# Patient Record
Sex: Female | Born: 1945 | Race: White | Hispanic: No | Smoking: Never smoker
Health system: Southern US, Community
[De-identification: ages and names within clinical notes are randomized; demographics above are authoritative.]

## PROBLEM LIST (undated history)

## (undated) DIAGNOSIS — I10 Essential (primary) hypertension: Secondary | ICD-10-CM

## (undated) DIAGNOSIS — I639 Cerebral infarction, unspecified: Secondary | ICD-10-CM

## (undated) HISTORY — PX: TUBAL LIGATION: SHX77

---

## 2018-03-31 ENCOUNTER — Other Ambulatory Visit: Payer: Self-pay

## 2018-03-31 ENCOUNTER — Emergency Department
Admission: EM | Admit: 2018-03-31 | Discharge: 2018-03-31 | Disposition: A | Discharge status: Home / Self Care | Payer: Medicare Other | Attending: Emergency Medicine | Admitting: Emergency Medicine

## 2018-03-31 ENCOUNTER — Emergency Department: Payer: Medicare Other

## 2018-03-31 DIAGNOSIS — I10 Essential (primary) hypertension: Secondary | ICD-10-CM | POA: Diagnosis not present

## 2018-03-31 DIAGNOSIS — Z8673 Personal history of transient ischemic attack (TIA), and cerebral infarction without residual deficits: Secondary | ICD-10-CM | POA: Insufficient documentation

## 2018-03-31 DIAGNOSIS — R112 Nausea with vomiting, unspecified: Secondary | ICD-10-CM | POA: Insufficient documentation

## 2018-03-31 DIAGNOSIS — R509 Fever, unspecified: Secondary | ICD-10-CM | POA: Diagnosis not present

## 2018-03-31 HISTORY — DX: Cerebral infarction, unspecified: I63.9

## 2018-03-31 HISTORY — DX: Essential (primary) hypertension: I10

## 2018-03-31 LAB — URINALYSIS, COMPLETE (UACMP) WITH MICROSCOPIC
Bacteria, UA: NONE SEEN
Bilirubin Urine: NEGATIVE
Glucose, UA: NEGATIVE mg/dL
Hgb urine dipstick: NEGATIVE
Ketones, ur: NEGATIVE mg/dL
Leukocytes, UA: NEGATIVE
Nitrite: NEGATIVE
Protein, ur: NEGATIVE mg/dL
SPECIFIC GRAVITY, URINE: 1.028 (ref 1.005–1.030)
pH: 7 (ref 5.0–8.0)

## 2018-03-31 LAB — CBC
HCT: 38.6 % (ref 36.0–46.0)
Hemoglobin: 12.6 g/dL (ref 12.0–15.0)
MCH: 29.1 pg (ref 26.0–34.0)
MCHC: 32.6 g/dL (ref 30.0–36.0)
MCV: 89.1 fL (ref 80.0–100.0)
PLATELETS: 214 10*3/uL (ref 150–400)
RBC: 4.33 MIL/uL (ref 3.87–5.11)
RDW: 13.3 % (ref 11.5–15.5)
WBC: 7.5 10*3/uL (ref 4.0–10.5)
nRBC: 0 % (ref 0.0–0.2)

## 2018-03-31 LAB — COMPREHENSIVE METABOLIC PANEL
ALT: 20 U/L (ref 0–44)
AST: 24 U/L (ref 15–41)
Albumin: 3.9 g/dL (ref 3.5–5.0)
Alkaline Phosphatase: 84 U/L (ref 38–126)
Anion gap: 9 (ref 5–15)
BILIRUBIN TOTAL: 1.2 mg/dL (ref 0.3–1.2)
BUN: 18 mg/dL (ref 8–23)
CO2: 24 mmol/L (ref 22–32)
Calcium: 8.5 mg/dL — ABNORMAL LOW (ref 8.9–10.3)
Chloride: 105 mmol/L (ref 98–111)
Creatinine, Ser: 1.03 mg/dL — ABNORMAL HIGH (ref 0.44–1.00)
GFR calc Af Amer: >60 mL/min (ref >60)
GFR calc non Af Amer: 55 mL/min — ABNORMAL LOW (ref >60)
Glucose, Bld: 152 mg/dL — ABNORMAL HIGH (ref 70–99)
Potassium: 3.5 mmol/L (ref 3.5–5.1)
Sodium: 138 mmol/L (ref 135–145)
TOTAL PROTEIN: 7.2 g/dL (ref 6.5–8.1)

## 2018-03-31 LAB — INFLUENZA PANEL BY PCR (TYPE A & B)
Influenza A By PCR: NEGATIVE
Influenza B By PCR: NEGATIVE

## 2018-03-31 LAB — LACTIC ACID, PLASMA
Lactic Acid, Venous: 2.4 mmol/L (ref 0.5–1.9)
Lactic Acid, Venous: 1.8 mmol/L (ref 0.5–1.9)

## 2018-03-31 LAB — LIPASE, BLOOD: Lipase: 36 U/L (ref 11–51)

## 2018-03-31 IMAGING — CT CT ABD-PELV W/ CM
2 of 5 series · 16 of 46 positions shown, 18 images · IV contrast (APPLIED)
Comparison: None.

CLINICAL DATA: Nausea and vomiting and fevers

EXAM:
CT ABDOMEN AND PELVIS WITH CONTRAST
TECHNIQUE: Multidetector CT imaging of the abdomen and pelvis was performed
using the standard protocol following bolus administration of
intravenous contrast.
CONTRAST:  100mL [UB] IOPAMIDOL ([UB]) INJECTION 61%

[Series 2: routine abd/pel with · axial · 0.72mm/px · z∈[-846,-442]mm · 13 of 93 slices shown, 15 images]
[im 6/93  soft-tissue]
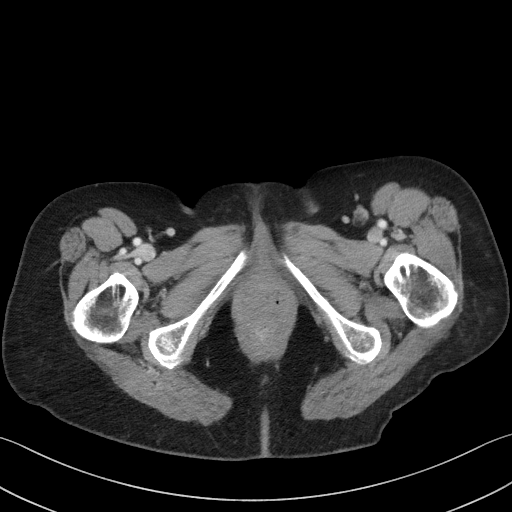
[im 6/93  bone]
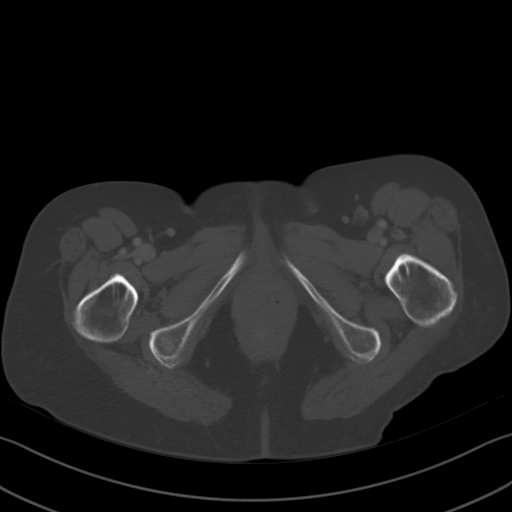
[im 11/93  soft-tissue]
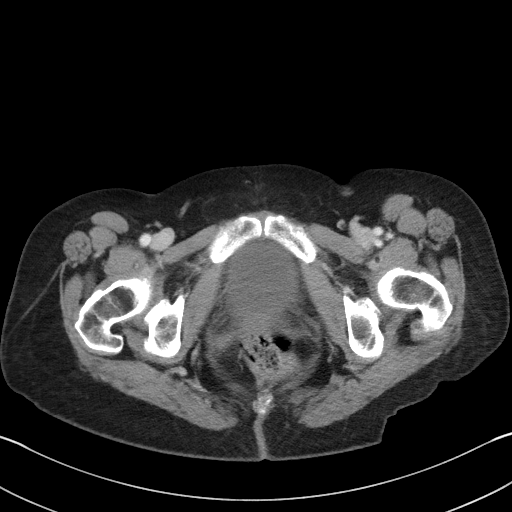
[im 21/93  soft-tissue]
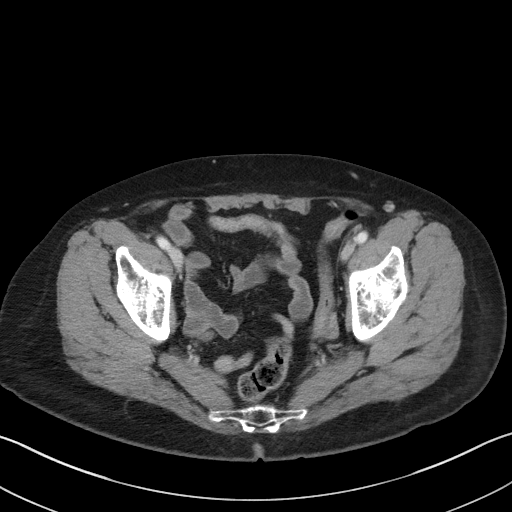
[im 26/93  soft-tissue]
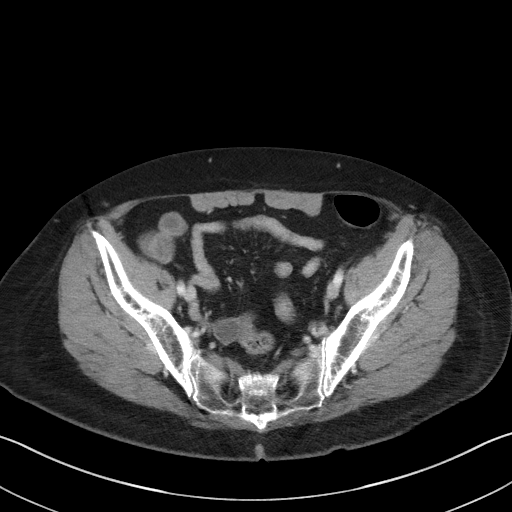
[im 31/93  soft-tissue]
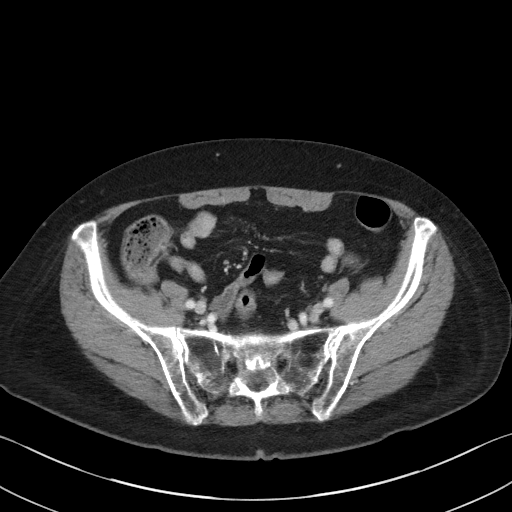
[im 41/93  soft-tissue]
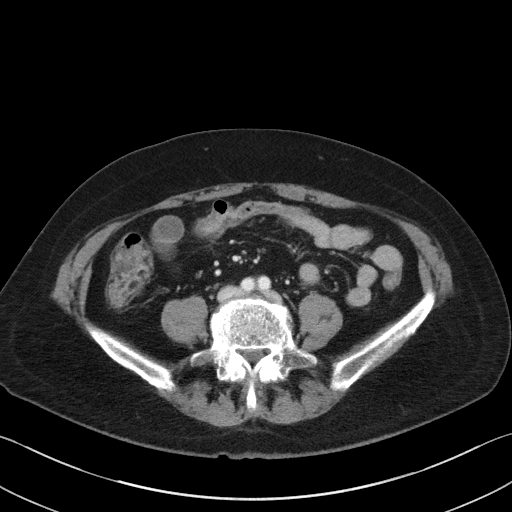
[im 47/93  soft-tissue]
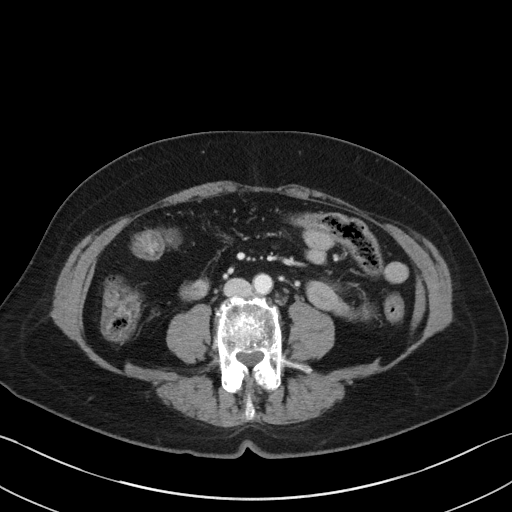
[im 52/93  soft-tissue]
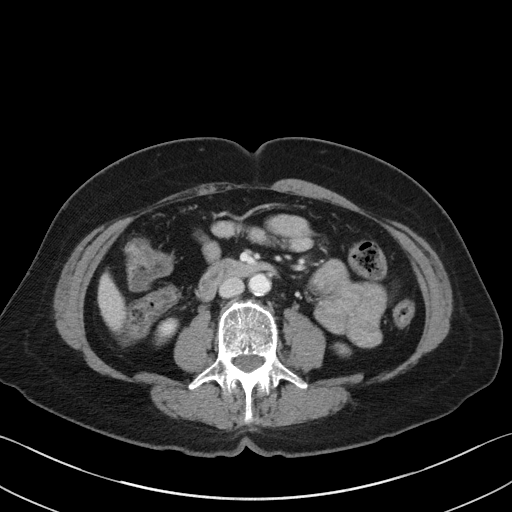
[im 62/93  soft-tissue]
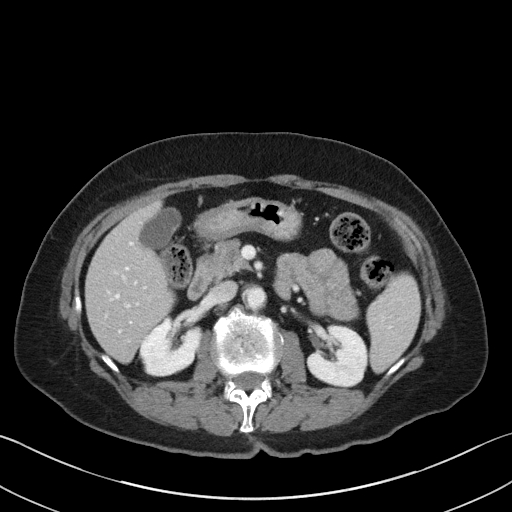
[im 62/93  bone]
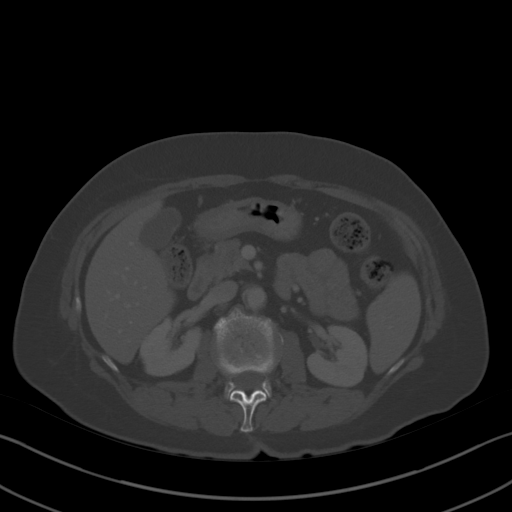
[im 67/93  soft-tissue]
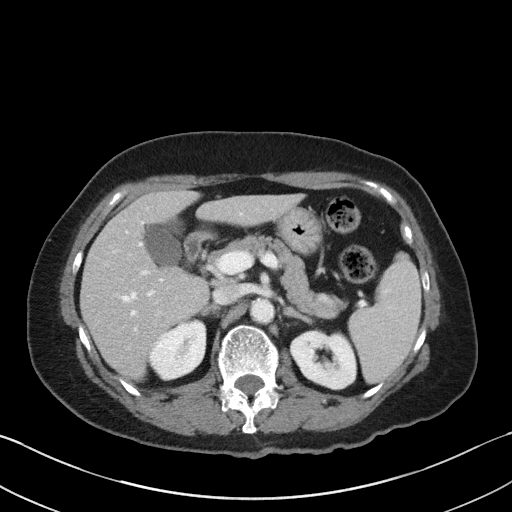
[im 72/93  soft-tissue]
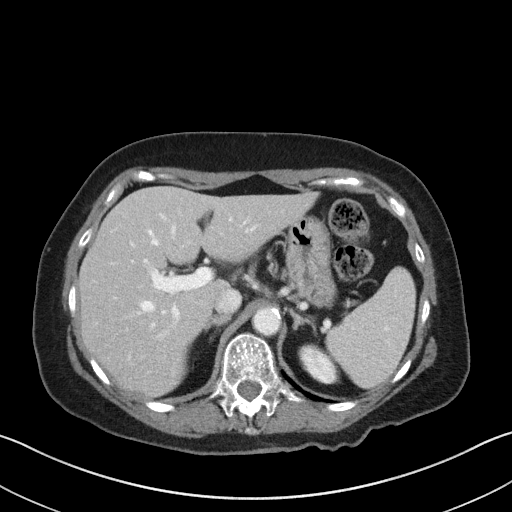
[im 82/93  soft-tissue]
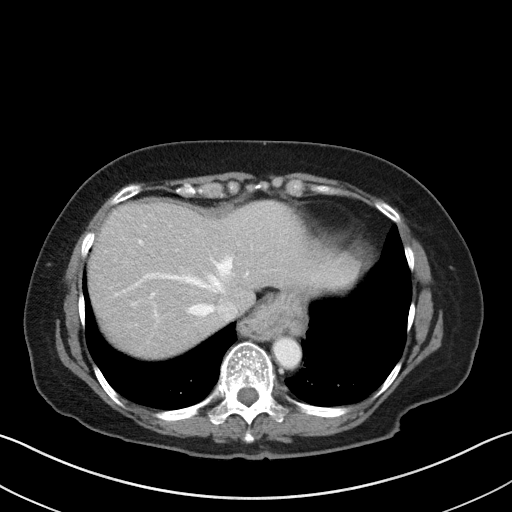
[im 87/93  soft-tissue]
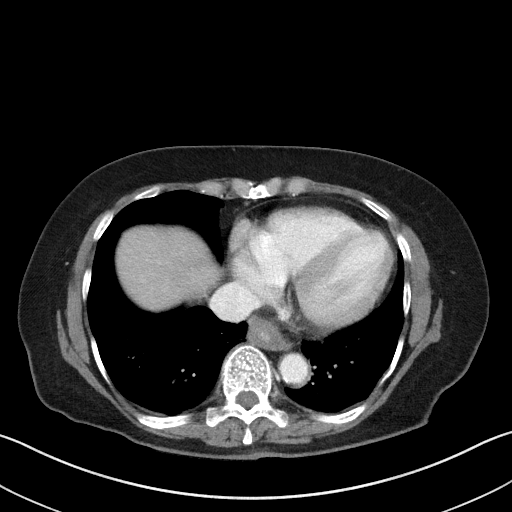

[Series 5: coronal st · coronal · 0.65mm/px · 3 of 77 slices shown]
[im 26/77  soft-tissue]
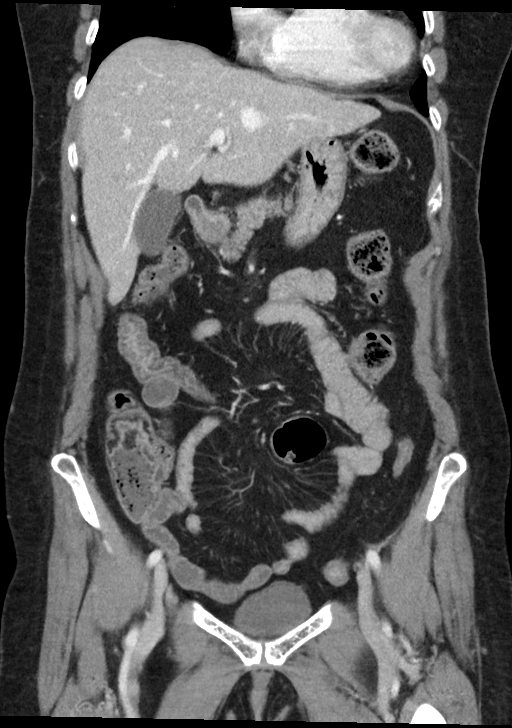
[im 34/77  soft-tissue]
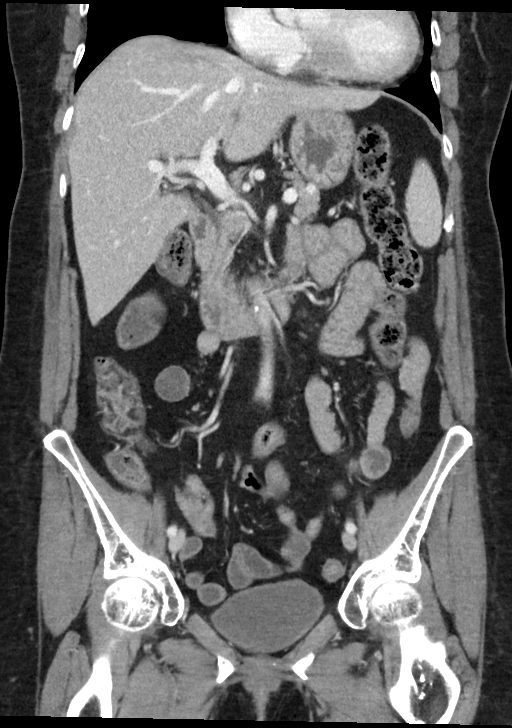
[im 43/77  soft-tissue]
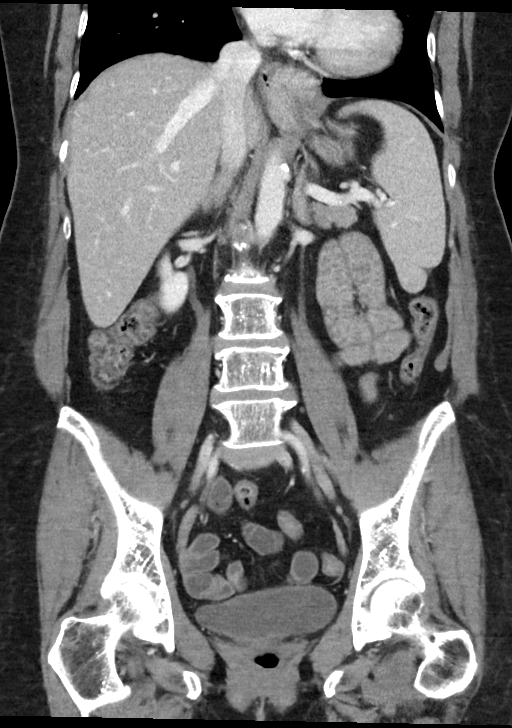

[16 of 46 positions shown; findings below may reference images not displayed]

FINDINGS: Lower chest: No acute abnormality.

Hepatobiliary: Mild fatty infiltration of the liver is noted. The
gallbladder is within normal limits.

Pancreas: Unremarkable. No pancreatic ductal dilatation or
surrounding inflammatory changes.

Spleen: Normal in size without focal abnormality.

Adrenals/Urinary Tract: Adrenal glands are unremarkable. Kidneys are
normal, without renal calculi, focal lesion, or hydronephrosis.
Bladder is unremarkable.

Stomach/Bowel: The appendix is not well visualized although no
inflammatory changes are seen. No obstructive or inflammatory
changes of the larger small-bowel are seen. Hiatal hernia is noted.

Vascular/Lymphatic: Aortic atherosclerosis. No enlarged abdominal or
pelvic lymph nodes.

Reproductive: Uterus and bilateral adnexa are unremarkable.

Other: No abdominal wall hernia or abnormality. No abdominopelvic
ascites.

Musculoskeletal: Degenerative changes of lumbar spine are noted.
Endplate deformity at L4 is noted of a chronic periods. No acute
bony abnormality is seen.
IMPRESSION: Chronic changes without acute abnormality.

## 2018-03-31 IMAGING — CR DG CHEST 2V
1 series · 2 of 2 positions shown · non-contrast
Comparison: None.

CLINICAL DATA: Nausea and vomiting for 1 day. Fever.

EXAM:
CHEST - 2 VIEW

[Series 1: dg chest 2 view · 0.14mm/px · 2 of 2 slices shown]
[im 1/2]
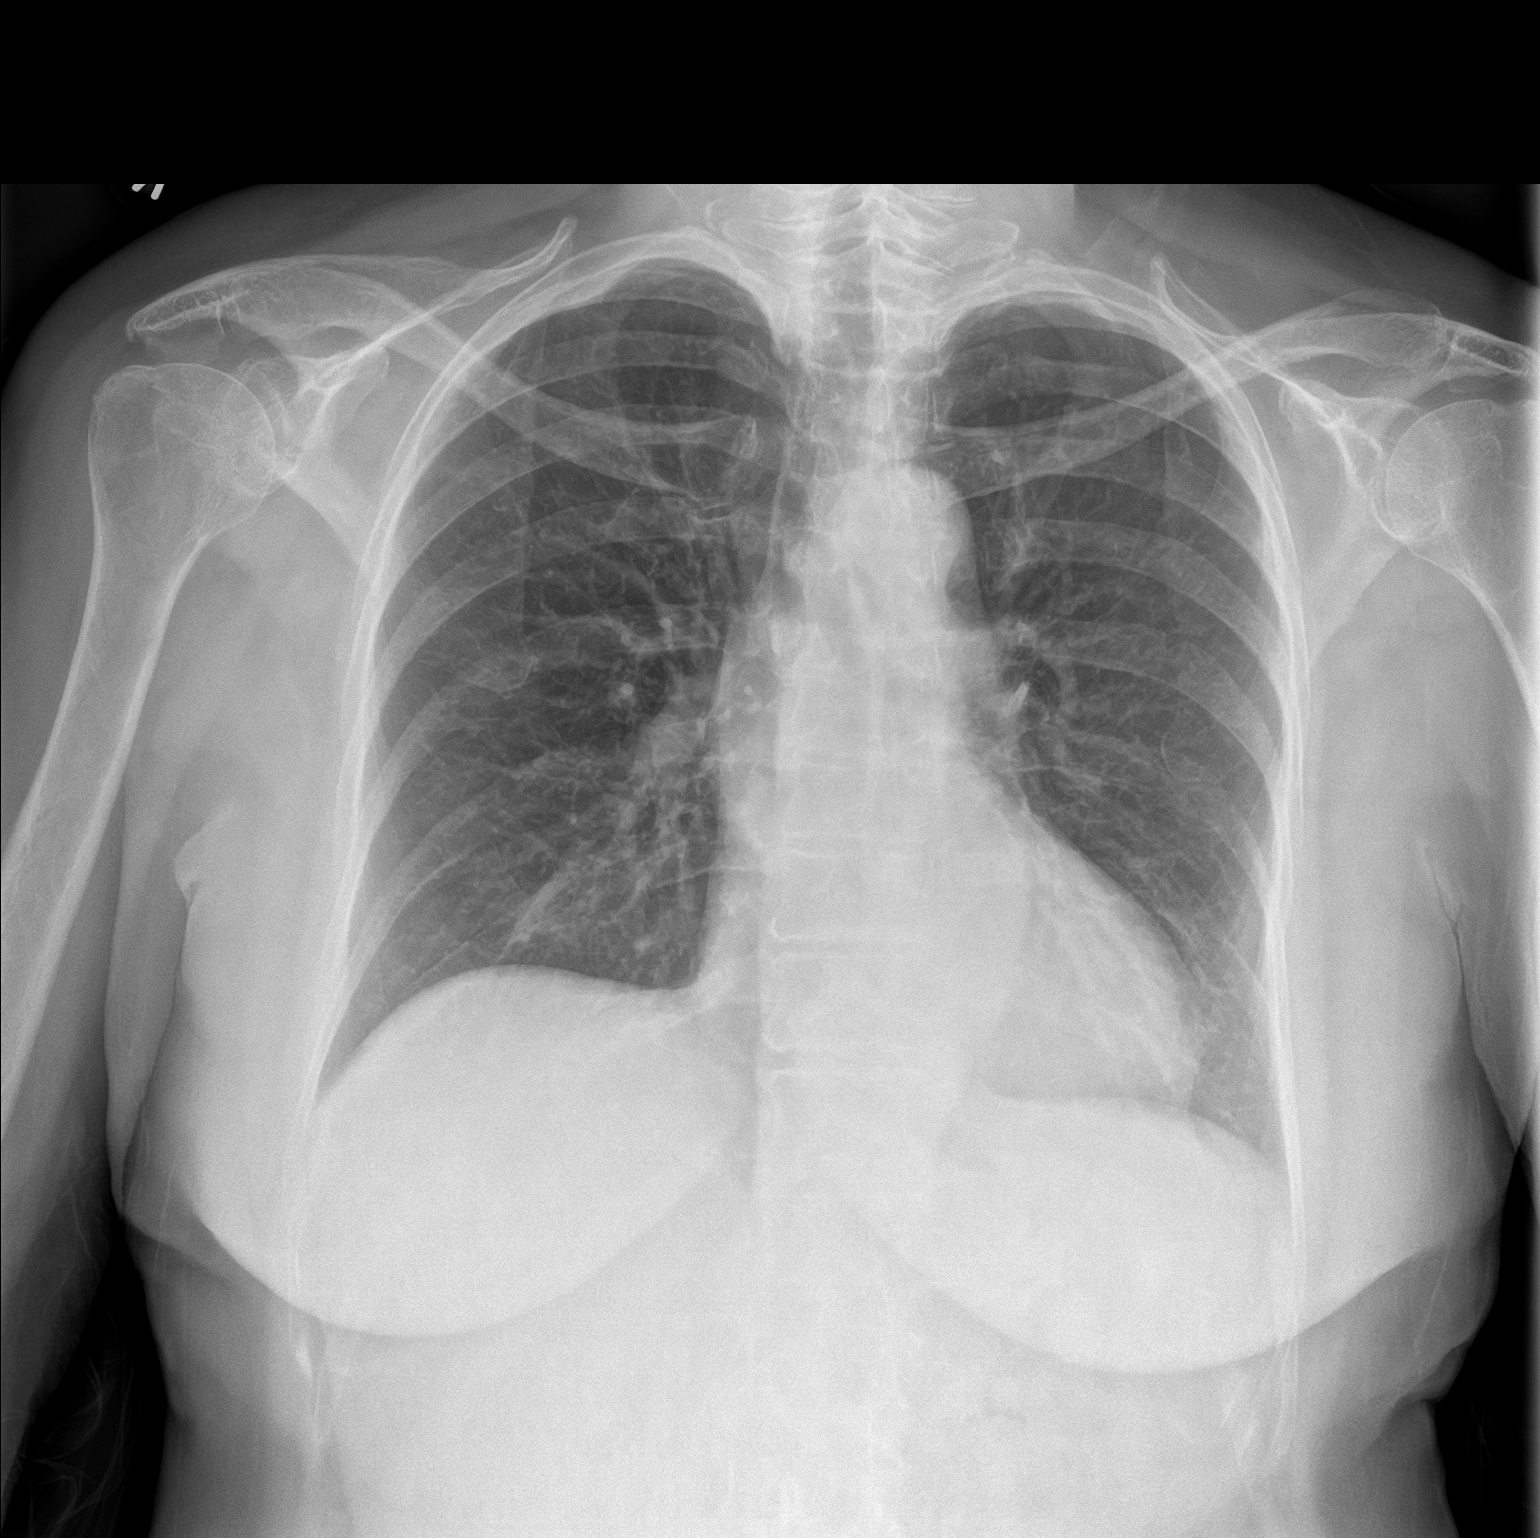
[im 2/2]
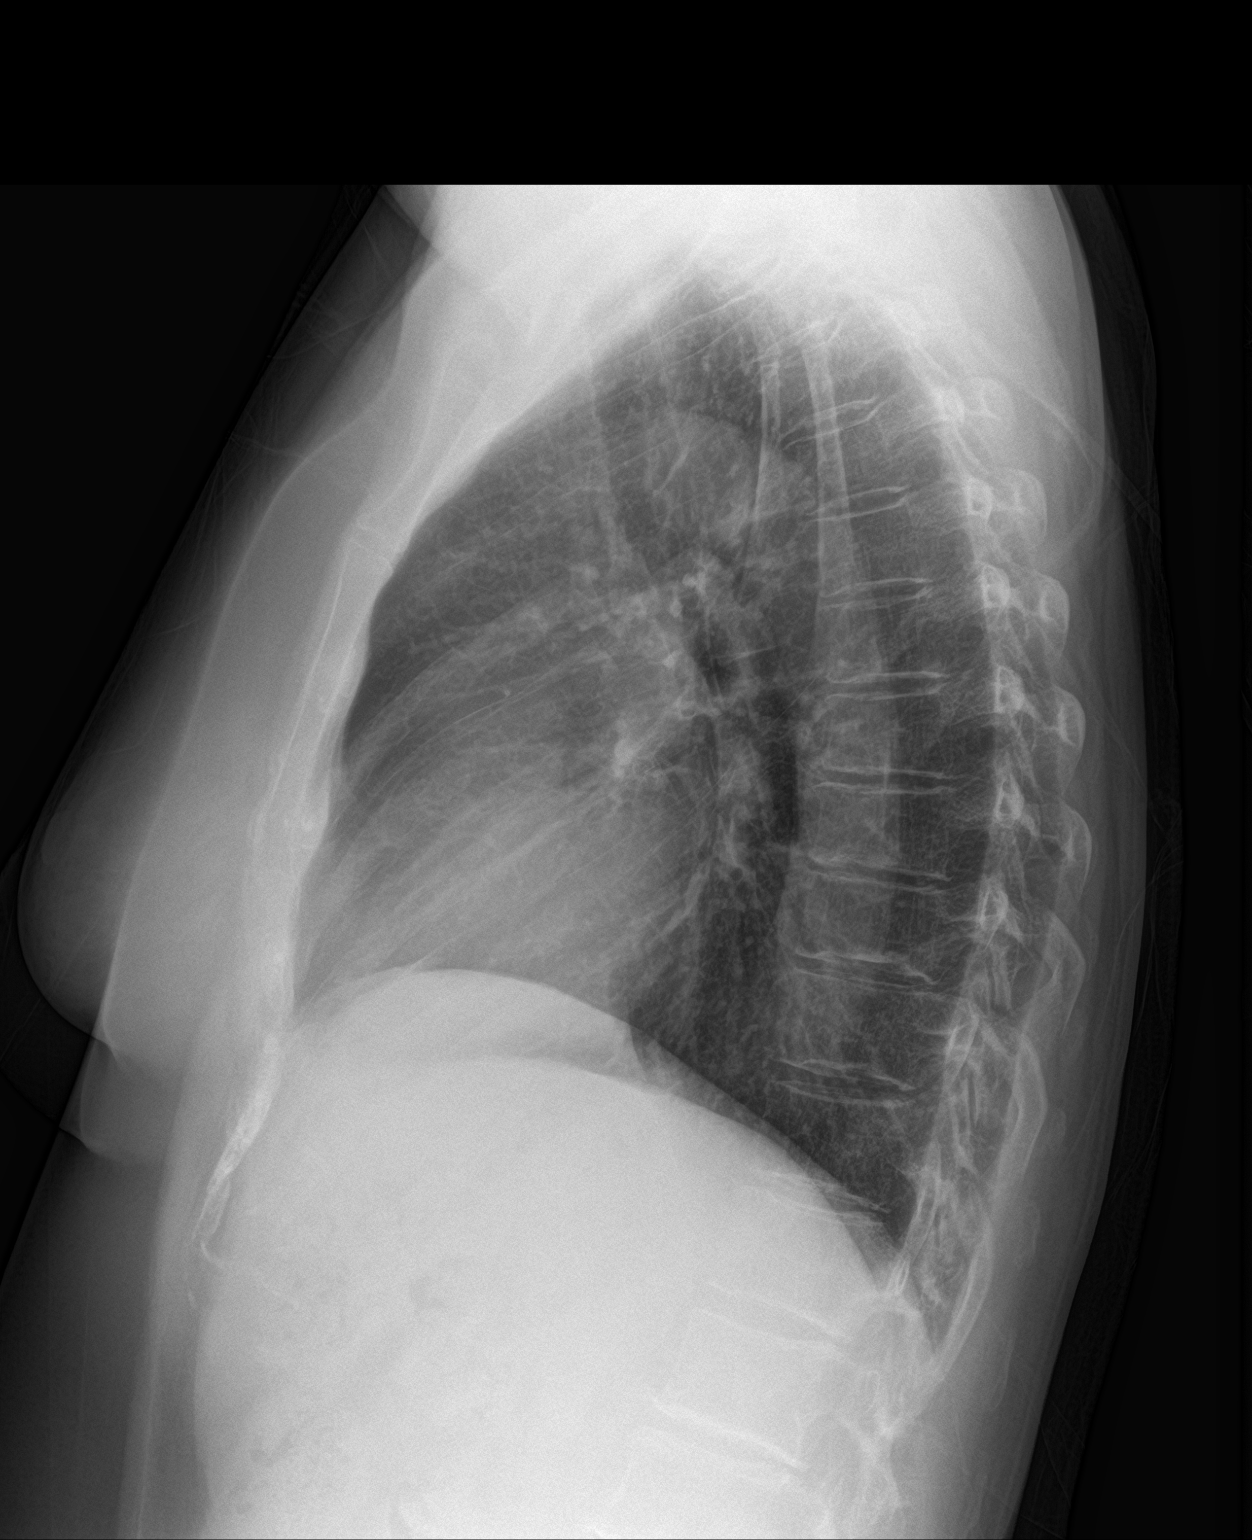

[2 of 2 positions shown; findings below may reference images not displayed]

FINDINGS: Normal sized heart. Tortuous aorta. Clear lungs. Mild peribronchial
thickening. Diffuse osteopenia.
IMPRESSION: Mild bronchitic changes.

## 2018-03-31 MED ORDER — ONDANSETRON 4 MG PO TBDP: 4.0000 mg | ORAL_TABLET | Freq: Three times a day (TID) | ORAL | 0 refills | Status: DC | PRN

## 2018-03-31 MED ORDER — IOPAMIDOL (ISOVUE-300) INJECTION 61%
100.0000 mL | Freq: Once | INTRAVENOUS | Status: AC | PRN
  Administered 2018-03-31: 100 mL via INTRAVENOUS

## 2018-03-31 MED ORDER — SODIUM CHLORIDE 0.9% FLUSH: 3.0000 mL | Freq: Once | INTRAVENOUS | Status: DC

## 2018-03-31 MED ORDER — ONDANSETRON HCL 4 MG/2ML IJ SOLN
4.0000 mg | Freq: Once | INTRAMUSCULAR | Status: AC | PRN
  Administered 2018-03-31: 4 mg via INTRAVENOUS
  Filled 2018-03-31: qty 2

## 2018-03-31 MED ORDER — SODIUM CHLORIDE 0.9 % IV SOLN: Freq: Once | INTRAVENOUS | Status: DC

## 2018-03-31 NOTE — ED Notes (Signed)
Patient transported to X-ray 

## 2018-03-31 NOTE — ED Notes (Signed)
Date and time results received: 03/31/18 **7:23 PM * (use smartphrase ".now" to insert current time)  Test: lactic Critical Value: 2.4  Name of Provider Notified: [email or web address]  Orders Received? Or Actions Taken?: Orders Received - See Orders for details

## 2018-03-31 NOTE — ED Provider Notes (Signed)
[facility] Emergency Department Provider Note  Time seen: 6:36 PM  I have reviewed the triage vital signs and the nursing notes.   HISTORY  Chief Complaint Emesis    HPI Ariel [name] is a 73 y.o. female with a past medical history of hypertension, CVA, presents to the emergency department for acute onset of nausea vomiting fever.  According to the patient around 5 PM tonight she began feeling very warm, began with nausea and vomiting.  Denies any diarrhea.  Denies any dysuria or hematuria.  States some body aches especially in her left leg/hip.  Denies any falls or trauma.  Denies any headache.  Denies any cough or congestion.  Upon arrival patient noted to be febrile to 103.1 with a heart rate of 115.  Past Medical History:  Diagnosis Date  . Hypertension   . Stroke [facility])     There are no active problems to display for this patient.   History reviewed. No pertinent surgical history.  Prior to Admission medications   Not on File    Not on File  No family history on file.  Social History Social History   Tobacco Use  . Smoking status: Never Smoker  . Smokeless tobacco: Never Used  Substance Use Topics  . Alcohol use: Never    Frequency: Never  . Drug use: Never    Review of Systems Constitutional: Positive for fever ENT: Negative for recent illness/congestion Cardiovascular: Negative for chest pain. Respiratory: Negative for shortness of breath.  Negative for cough. Gastrointestinal: Negative for abdominal pain.  Positive for vomiting.  Negative for diarrhea. Genitourinary: Negative for urinary compaints Musculoskeletal: Mild left hip pain, crampy pain. Skin: Negative for skin complaints  Neurological: Negative for headache All other ROS negative  ____________________________________________   PHYSICAL EXAM:  VITAL SIGNS: ED Triage Vitals  Enc Vitals Group     BP 03/31/18 1829 (!) 167/77     Pulse Rate 03/31/18 1829 (!) 113     Resp 03/31/18 1829 20     Temp 03/31/18 1829 (!) 103.1 F (39.5 C)     Temp Source 03/31/18 1829 Oral     SpO2 03/31/18 1829 96 %     Weight 03/31/18 1825 144 lb (65.3 kg)     Height 03/31/18 1825 5\' 6"  (1.676 m)     Head Circumference --      Peak Flow --      Pain Score 03/31/18 1825 4     Pain Loc --      Pain Edu? --      Excl. in [place]? --    Constitutional: Alert and oriented.  Peers nauseated holding an emesis bag. Eyes: Normal exam ENT   Head: Normocephalic and atraumatic   Mouth/Throat: Mucous membranes are moist. Cardiovascular: Regular rhythm, rate around 120 bpm.  No obvious murmur. Respiratory: Normal respiratory effort without tachypnea nor retractions. Breath sounds are clear.   Gastrointestinal: Soft, mild tenderness in left lower quadrant.  No rebound guarding or distention.  Abdomen otherwise benign. Musculoskeletal: Nontender with normal range of motion in all extremities.  Neurologic:  Normal speech and language. No gross focal neurologic deficits Skin:  Skin is warm, dry and intact.  Psychiatric: Mood and affect are normal.   ____________________________________________    RADIOLOGY  Chest x-ray is clear CT scan of the abdomen/pelvis is normal.  ____________________________________________   INITIAL IMPRESSION / ASSESSMENT AND PLAN / ED COURSE  Pertinent labs & imaging results that were available during my  care of the patient were reviewed by me and considered in my medical decision making (see chart for details).  Patient presents to the emergency department for acute onset of nausea vomiting fever.  Found to be tachycardic around 120 febrile to 103 upon arrival.  Differential is quite broad at this time given her left lower quadrant tenderness could include intra-abdominal pathology or infectious etiology.  Given the patient's fever with acute onset nausea vomiting influenza would also be in the differential.  Differential would also include  metabolic or electrolyte abnormality, other infectious etiology such as pneumonia.  We will check labs, blood cultures, urine, urine culture we will obtain a chest x-ray, influenza swab given the patient's left lower quadrant tenderness will obtain a CT of the abdomen/pelvis as well.  We will treat with Zofran, IV fluids, Tylenol and continue to closely monitor.  Patient's work-up is overall very reassuring.  Temperature came down to 99.2 heart rate currently between 101 110 bpm.  Patient states she feels much better.  Patient is asking to be discharged home.  Patient's work-up overall is reassuring with a normal white blood cell count, normal chemistry, urinalysis is normal.  Chest x-ray is clear.  CT scan of the abdomen/pelvis is normal.  Influenza test negative.  Patient's vitals are improved.  As she appears very well and is asking to be discharged home, in fact the patient was asking to sign out AMA since it was taking too long for her lactate to return.  I believe the patient is safe for discharge home on Zofran.  I discussed with the patient very strict return precautions for any worsening in her condition, discussed supportive care at home including Tylenol ibuprofen every 6 hours as well as Zofran as needed for nausea.  Patient agreeable to plan of care and will follow up with her doctor.  ____________________________________________   FINAL CLINICAL IMPRESSION(S) / ED DIAGNOSES  Nausea vomiting Fever   Harvest Dark, MD 03/31/18 2127

## 2018-03-31 NOTE — ED Triage Notes (Signed)
Pt to ED via EMS from home. Pt c/o n/v x1day. Pt denies any abd pain, but c/o left hip pain new today denies injury or trauma to the area. Pt arrives with temp of 103.1 and vomiting. NAD. VSS.

## 2018-04-02 LAB — URINE CULTURE

## 2018-04-05 LAB — CULTURE, BLOOD (ROUTINE X 2)
Culture: NO GROWTH
Culture: NO GROWTH
SPECIAL REQUESTS: ADEQUATE
Special Requests: ADEQUATE

## 2019-10-20 DIAGNOSIS — N1832 Chronic kidney disease, stage 3b: Secondary | ICD-10-CM | POA: Insufficient documentation

## 2020-08-27 ENCOUNTER — Emergency Department (HOSPITAL_COMMUNITY): Payer: Medicare HMO

## 2020-08-27 ENCOUNTER — Observation Stay (HOSPITAL_COMMUNITY): Payer: Medicare HMO

## 2020-08-27 ENCOUNTER — Other Ambulatory Visit: Payer: Self-pay

## 2020-08-27 ENCOUNTER — Encounter (HOSPITAL_COMMUNITY): Payer: Self-pay | Admitting: Internal Medicine

## 2020-08-27 ENCOUNTER — Observation Stay (HOSPITAL_BASED_OUTPATIENT_CLINIC_OR_DEPARTMENT_OTHER): Payer: Medicare HMO

## 2020-08-27 ENCOUNTER — Observation Stay (HOSPITAL_COMMUNITY)
Admission: EM | Admit: 2020-08-27 | Discharge: 2020-08-28 | Disposition: A | Discharge status: Home / Self Care | Payer: Medicare HMO | Attending: Internal Medicine | Admitting: Internal Medicine

## 2020-08-27 DIAGNOSIS — I639 Cerebral infarction, unspecified: Secondary | ICD-10-CM | POA: Diagnosis not present

## 2020-08-27 DIAGNOSIS — I10 Essential (primary) hypertension: Secondary | ICD-10-CM | POA: Diagnosis present

## 2020-08-27 DIAGNOSIS — N1831 Chronic kidney disease, stage 3a: Secondary | ICD-10-CM | POA: Insufficient documentation

## 2020-08-27 DIAGNOSIS — R5381 Other malaise: Secondary | ICD-10-CM | POA: Diagnosis not present

## 2020-08-27 DIAGNOSIS — I6340 Cerebral infarction due to embolism of unspecified cerebral artery: Secondary | ICD-10-CM

## 2020-08-27 DIAGNOSIS — N179 Acute kidney failure, unspecified: Secondary | ICD-10-CM | POA: Diagnosis not present

## 2020-08-27 DIAGNOSIS — F419 Anxiety disorder, unspecified: Secondary | ICD-10-CM

## 2020-08-27 DIAGNOSIS — Z79899 Other long term (current) drug therapy: Secondary | ICD-10-CM | POA: Insufficient documentation

## 2020-08-27 DIAGNOSIS — M6281 Muscle weakness (generalized): Secondary | ICD-10-CM | POA: Insufficient documentation

## 2020-08-27 DIAGNOSIS — R531 Weakness: Secondary | ICD-10-CM | POA: Diagnosis present

## 2020-08-27 DIAGNOSIS — Y900 Blood alcohol level of less than 20 mg/100 ml: Secondary | ICD-10-CM | POA: Diagnosis not present

## 2020-08-27 DIAGNOSIS — I129 Hypertensive chronic kidney disease with stage 1 through stage 4 chronic kidney disease, or unspecified chronic kidney disease: Secondary | ICD-10-CM | POA: Diagnosis not present

## 2020-08-27 DIAGNOSIS — I6389 Other cerebral infarction: Secondary | ICD-10-CM

## 2020-08-27 DIAGNOSIS — I6310 Cerebral infarction due to embolism of unspecified precerebral artery: Principal | ICD-10-CM | POA: Insufficient documentation

## 2020-08-27 DIAGNOSIS — Z20822 Contact with and (suspected) exposure to covid-19: Secondary | ICD-10-CM | POA: Insufficient documentation

## 2020-08-27 DIAGNOSIS — F32A Depression, unspecified: Secondary | ICD-10-CM

## 2020-08-27 DIAGNOSIS — K219 Gastro-esophageal reflux disease without esophagitis: Secondary | ICD-10-CM | POA: Diagnosis present

## 2020-08-27 LAB — RAPID URINE DRUG SCREEN, HOSP PERFORMED
Amphetamines: NOT DETECTED
Barbiturates: NOT DETECTED
Benzodiazepines: NOT DETECTED
Cocaine: NOT DETECTED
Opiates: NOT DETECTED
Tetrahydrocannabinol: NOT DETECTED

## 2020-08-27 LAB — I-STAT CHEM 8, ED
BUN: 25 mg/dL — ABNORMAL HIGH (ref 8–23)
Calcium, Ion: 1.11 mmol/L — ABNORMAL LOW (ref 1.15–1.40)
Chloride: 107 mmol/L (ref 98–111)
Creatinine, Ser: 1.5 mg/dL — ABNORMAL HIGH (ref 0.44–1.00)
Glucose, Bld: 136 mg/dL — ABNORMAL HIGH (ref 70–99)
HCT: 32 % — ABNORMAL LOW (ref 36.0–46.0)
Hemoglobin: 10.9 g/dL — ABNORMAL LOW (ref 12.0–15.0)
Potassium: 3.9 mmol/L (ref 3.5–5.1)
Sodium: 138 mmol/L (ref 135–145)
TCO2: 22 mmol/L (ref 22–32)

## 2020-08-27 LAB — COMPREHENSIVE METABOLIC PANEL
ALT: 25 U/L (ref 0–44)
AST: 22 U/L (ref 15–41)
Albumin: 3.2 g/dL — ABNORMAL LOW (ref 3.5–5.0)
Alkaline Phosphatase: 67 U/L (ref 38–126)
Anion gap: 10 (ref 5–15)
BUN: 26 mg/dL — ABNORMAL HIGH (ref 8–23)
CO2: 24 mmol/L (ref 22–32)
Calcium: 9.1 mg/dL (ref 8.9–10.3)
Chloride: 103 mmol/L (ref 98–111)
Creatinine, Ser: 1.53 mg/dL — ABNORMAL HIGH (ref 0.44–1.00)
GFR, Estimated: 36 mL/min — ABNORMAL LOW (ref >60)
Glucose, Bld: 136 mg/dL — ABNORMAL HIGH (ref 70–99)
Potassium: 3.8 mmol/L (ref 3.5–5.1)
Sodium: 137 mmol/L (ref 135–145)
Total Bilirubin: 0.7 mg/dL (ref 0.3–1.2)
Total Protein: 6.5 g/dL (ref 6.5–8.1)

## 2020-08-27 LAB — DIFFERENTIAL
Abs Immature Granulocytes: 0.03 10*3/uL (ref 0.00–0.07)
Basophils Absolute: 0 10*3/uL (ref 0.0–0.1)
Basophils Relative: 0 %
Eosinophils Absolute: 0.1 10*3/uL (ref 0.0–0.5)
Eosinophils Relative: 1 %
Immature Granulocytes: 0 %
Lymphocytes Relative: 38 %
Lymphs Abs: 3.7 10*3/uL (ref 0.7–4.0)
Monocytes Absolute: 0.5 10*3/uL (ref 0.1–1.0)
Monocytes Relative: 5 %
Neutro Abs: 5.4 10*3/uL (ref 1.7–7.7)
Neutrophils Relative %: 56 %

## 2020-08-27 LAB — CBC
HCT: 33 % — ABNORMAL LOW (ref 36.0–46.0)
Hemoglobin: 10.4 g/dL — ABNORMAL LOW (ref 12.0–15.0)
MCH: 27 pg (ref 26.0–34.0)
MCHC: 31.5 g/dL (ref 30.0–36.0)
MCV: 85.7 fL (ref 80.0–100.0)
Platelets: 256 10*3/uL (ref 150–400)
RBC: 3.85 MIL/uL — ABNORMAL LOW (ref 3.87–5.11)
RDW: 14.9 % (ref 11.5–15.5)
WBC: 9.8 10*3/uL (ref 4.0–10.5)
nRBC: 0 % (ref 0.0–0.2)

## 2020-08-27 LAB — ECHOCARDIOGRAM COMPLETE BUBBLE STUDY
Area-P 1/2: 2.66 cm2
Calc EF: 61.1 %
S' Lateral: 2.6 cm
Single Plane A2C EF: 50.4 %
Single Plane A4C EF: 68.2 %

## 2020-08-27 LAB — URINALYSIS, ROUTINE W REFLEX MICROSCOPIC
Bilirubin Urine: NEGATIVE
Glucose, UA: NEGATIVE mg/dL
Hgb urine dipstick: NEGATIVE
Ketones, ur: NEGATIVE mg/dL
Leukocytes,Ua: NEGATIVE
Nitrite: NEGATIVE
Protein, ur: NEGATIVE mg/dL
Specific Gravity, Urine: 1.031 — ABNORMAL HIGH (ref 1.005–1.030)
pH: 5 (ref 5.0–8.0)

## 2020-08-27 LAB — LIPID PANEL
Cholesterol: 226 mg/dL — ABNORMAL HIGH (ref 0–200)
HDL: 52 mg/dL (ref >40)
LDL Cholesterol: 144 mg/dL — ABNORMAL HIGH (ref 0–99)
Total CHOL/HDL Ratio: 4.3 RATIO
Triglycerides: 149 mg/dL (ref <150)
VLDL: 30 mg/dL (ref 0–40)

## 2020-08-27 LAB — APTT: aPTT: 25 seconds (ref 24–36)

## 2020-08-27 LAB — RESP PANEL BY RT-PCR (FLU A&B, COVID) ARPGX2
Influenza A by PCR: NEGATIVE
Influenza B by PCR: NEGATIVE
SARS Coronavirus 2 by RT PCR: NEGATIVE

## 2020-08-27 LAB — ETHANOL: Alcohol, Ethyl (B): <10 mg/dL (ref <10)

## 2020-08-27 LAB — HEMOGLOBIN A1C
Hgb A1c MFr Bld: 6.1 % — ABNORMAL HIGH (ref 4.8–5.6)
Mean Plasma Glucose: 128.37 mg/dL

## 2020-08-27 LAB — PROTIME-INR
INR: 0.9 (ref 0.8–1.2)
Prothrombin Time: 12.4 seconds (ref 11.4–15.2)

## 2020-08-27 LAB — CK: Total CK: 68 U/L (ref 38–234)

## 2020-08-27 IMAGING — CT CT HEAD CODE STROKE
4 series · 17 of 47 positions shown, 19 images · non-contrast
Comparison: None.

CLINICAL DATA: Code stroke.

EXAM:
CT HEAD WITHOUT CONTRAST
TECHNIQUE: Contiguous axial images were obtained from the base of the skull
through the vertex without intravenous contrast.

[Series 2: head wo · axial · 0.46mm/px · z∈[-192,-58]mm · 7 of 37 slices shown, 9 images]
[im 5/37  brain]
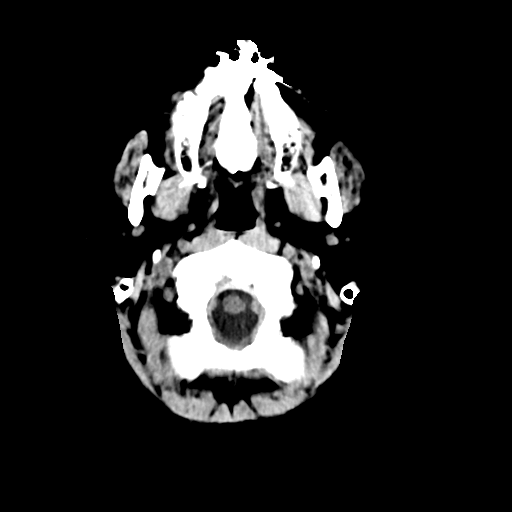
[im 5/37  bone]
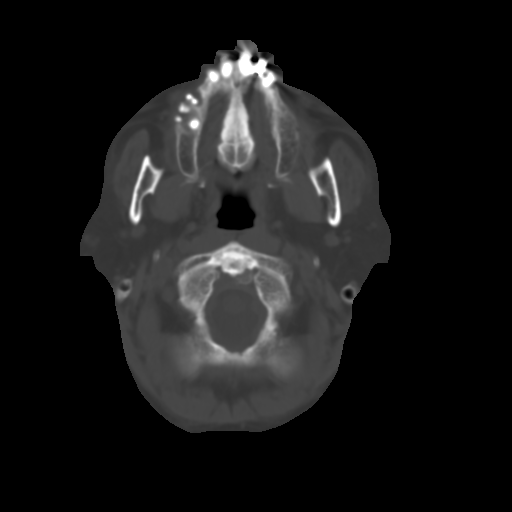
[im 10/37  brain]
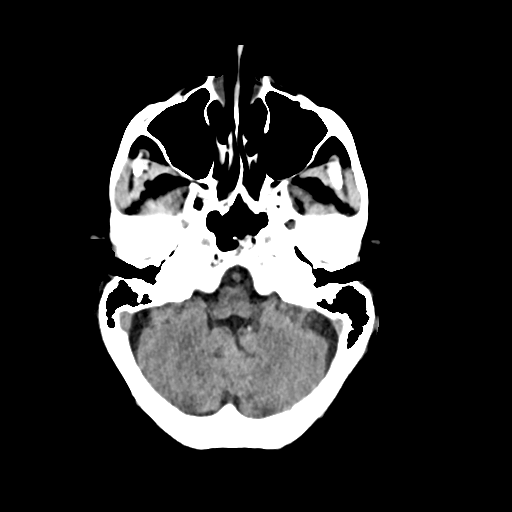
[im 14/37  brain]
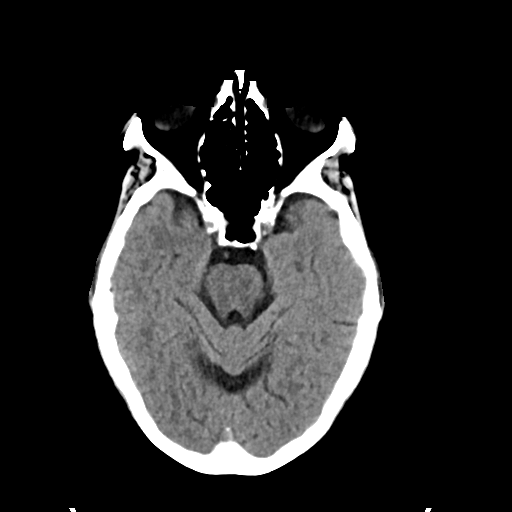
[im 19/37  brain]
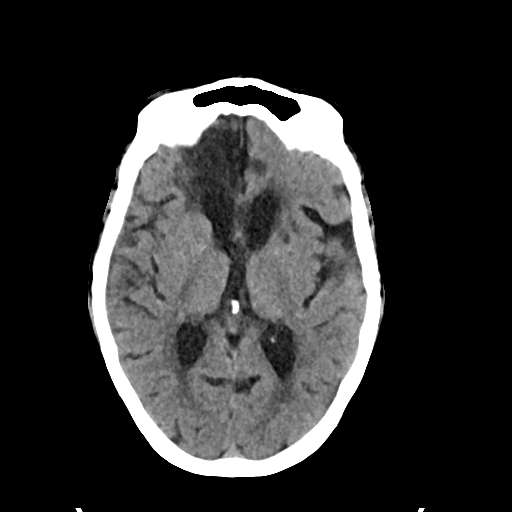
[im 23/37  brain]
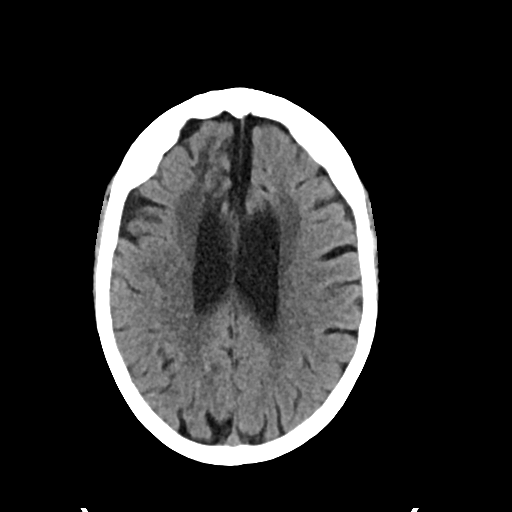
[im 23/37  bone]
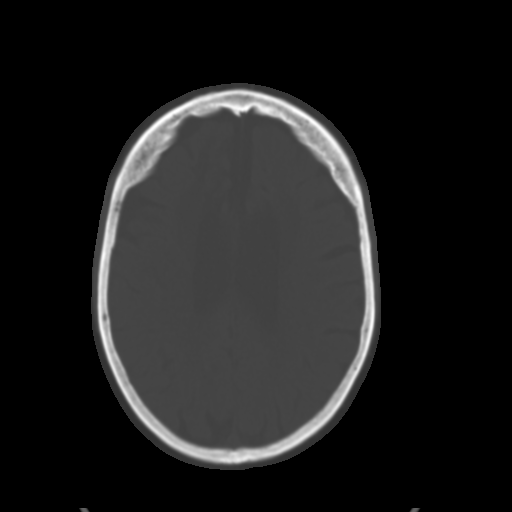
[im 28/37  brain]
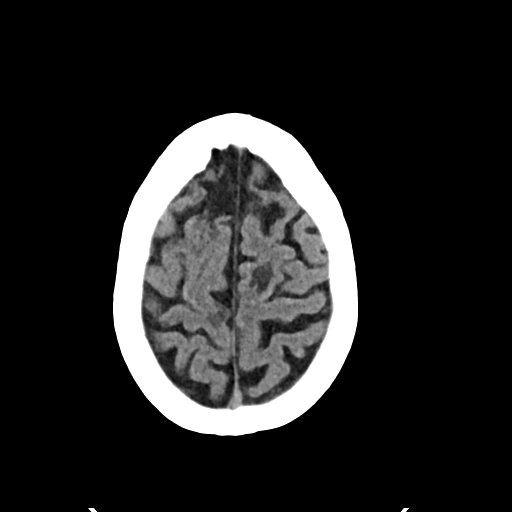
[im 32/37  brain]
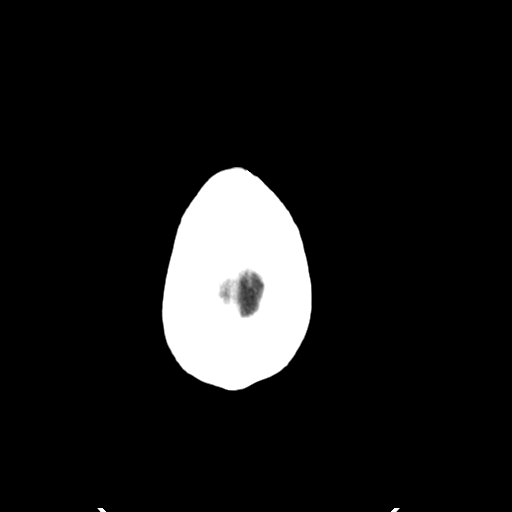

[Series 4: head bone · axial · 0.46mm/px · z∈[-194,-132]mm · 4 of 92 slices shown]
[im 10/92  bone]
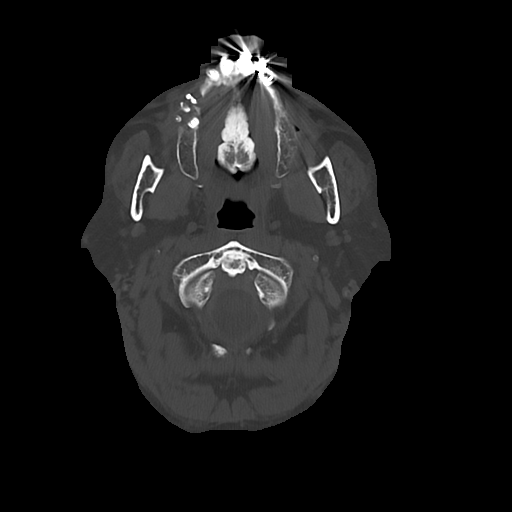
[im 19/92  bone]
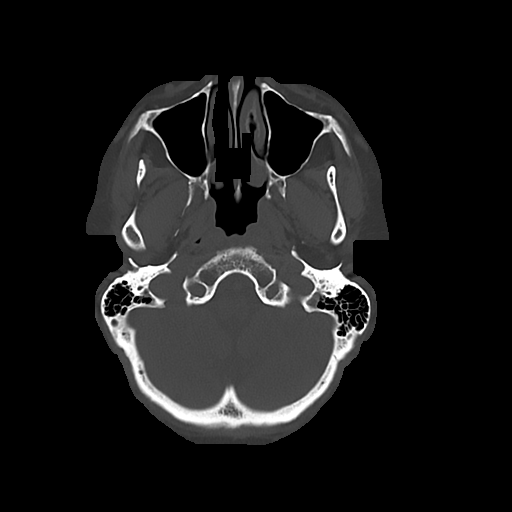
[im 28/92  bone]
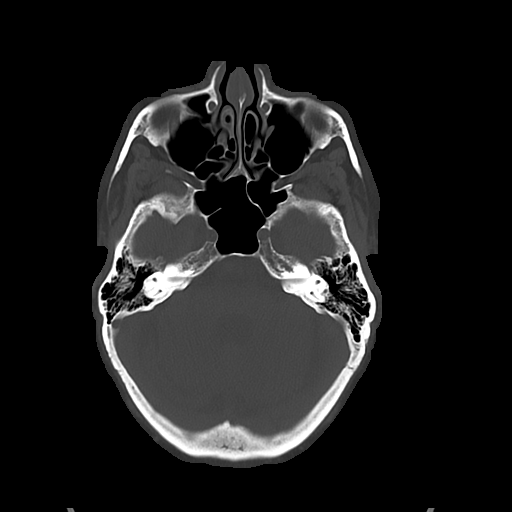
[im 41/92  bone]
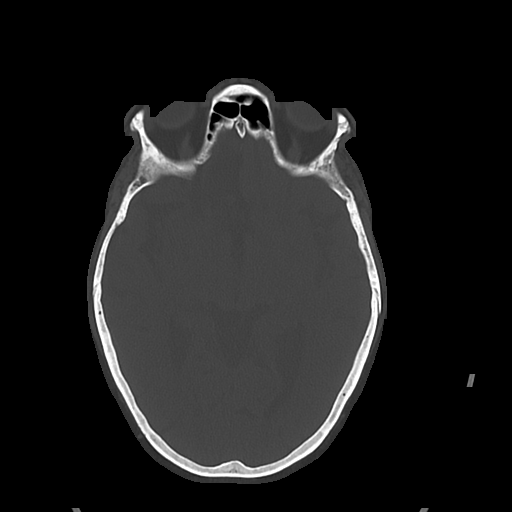

[Series 5: cor soft · coronal · 0.34mm/px · 3 of 71 slices shown]
[im 24/71  brain]
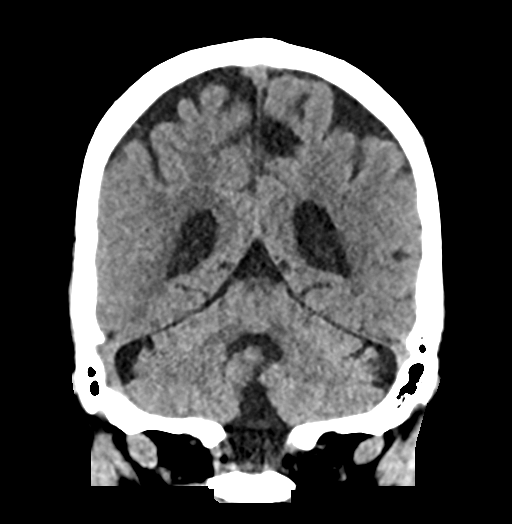
[im 32/71  brain]
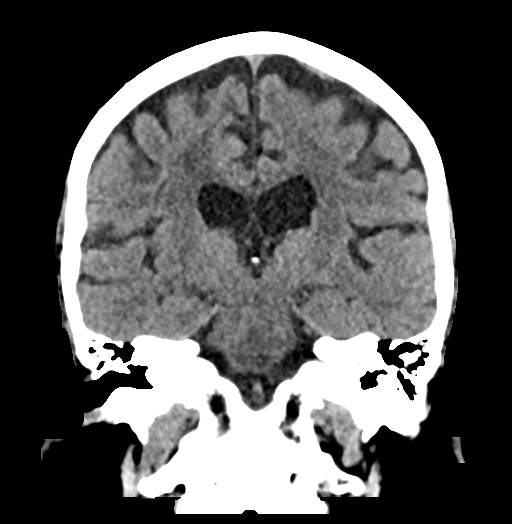
[im 39/71  brain]
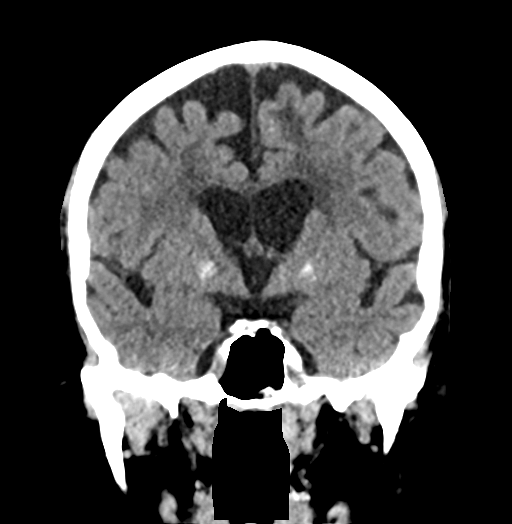

[Series 6: sag soft · sagittal · 0.35mm/px · 3 of 56 slices shown]
[im 19/56  brain]
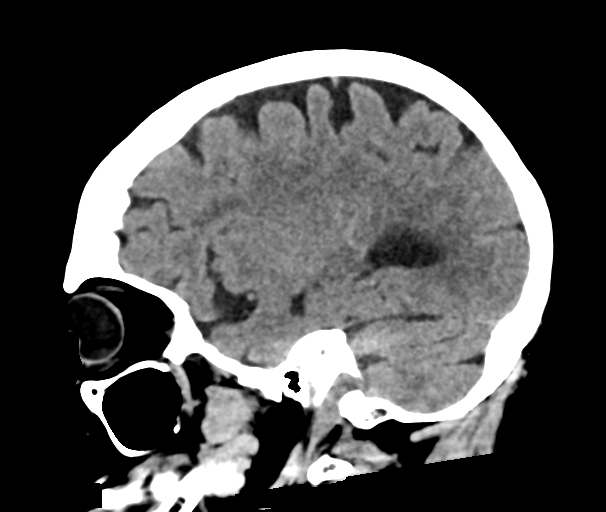
[im 28/56  brain]
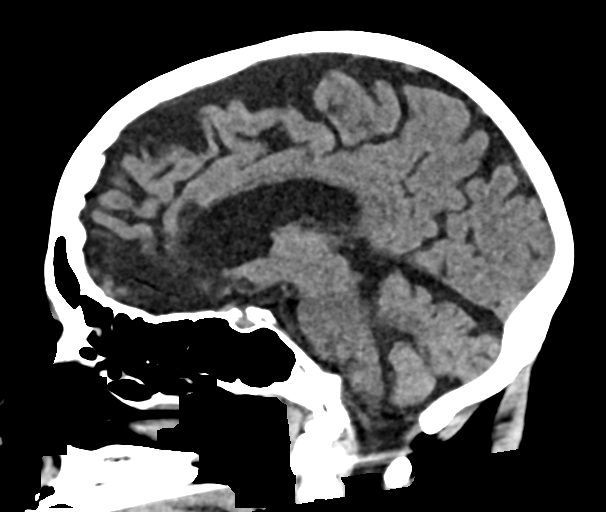
[im 37/56  brain]
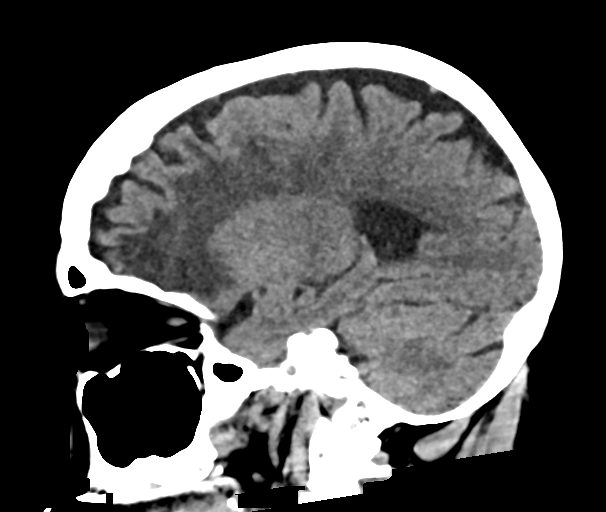

[17 of 47 positions shown; findings below may reference images not displayed]

FINDINGS: Brain: No evidence of acute infarction, hemorrhage, hydrocephalus,
extra-axial collection or mass lesion/mass effect. Remote infarct in
the parasagittal right frontal lobe, ACA distribution. Much smaller
distal left ACA territory infarct in the parasagittal high left
frontal cortex and white matter. No hemorrhage, hydrocephalus, or
masslike finding

Vascular: No hyperdense vessel or unexpected calcification.

Skull: Normal. Negative for fracture or focal lesion.

Sinuses/Orbits: No acute finding.

Other: These results were communicated to Dr. SPANEPAL at [DATE]
SPANEPAL [DATE]by text page via the [employer] messaging system.

ASPECTS ([place] Stroke Program Early CT Score)

Not scored without localizing symptoms
IMPRESSION: 1. No acute finding.
2. Remote bilateral ACA distribution infarcts, more extensive on the
right.

## 2020-08-27 IMAGING — MR MR HEAD W/O CM
9 of 10 series · 37 of 48 positions shown · non-contrast
Comparison: Same day CT and CTA

CLINICAL DATA: Neuro deficit, acute stroke suspected.

EXAM:
MRI HEAD WITHOUT CONTRAST
TECHNIQUE: Multiplanar, multiecho pulse sequences of the brain and surrounding
structures were obtained without intravenous contrast.

[Series 3: DWI · axial · 3.0mm · 1.09mm/px · z∈[-58,+77]mm · 11 of 92 slices shown (1 of 4)]
[im 1/92]
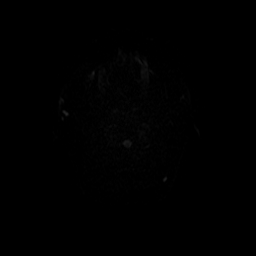
[im 10/92]
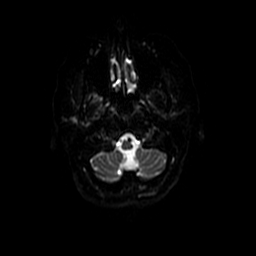
[im 19/92]
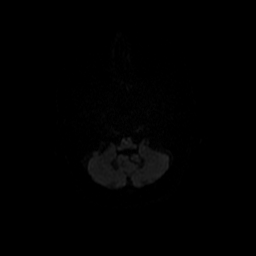
[im 28/92]
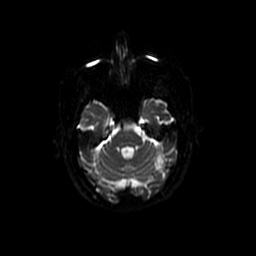
[im 37/92]
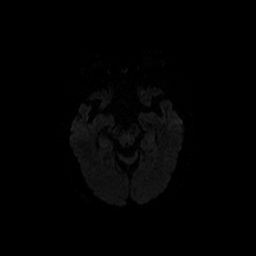
[im 46/92]
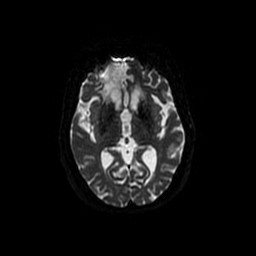
[im 55/92]
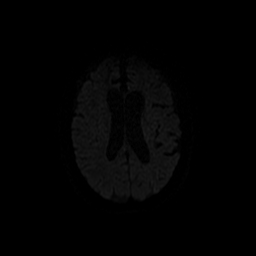
[im 64/92]
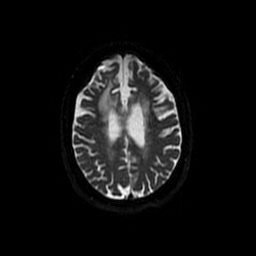
[im 73/92]
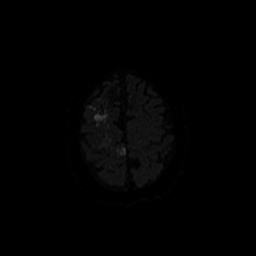
[im 82/92]
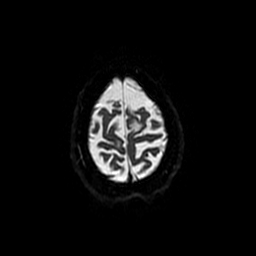
[im 92/92]
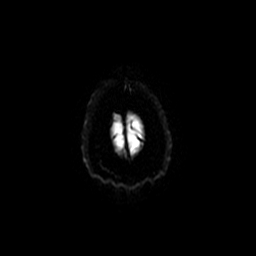

[Series 4: DWI · coronal · 5.0mm · 1.09mm/px · 7 of 68 slices shown (2 of 4)]
[im 1/68]
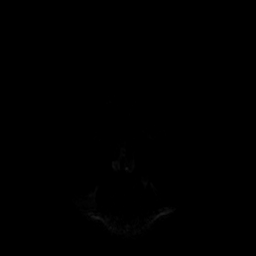
[im 12/68]
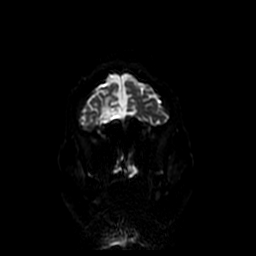
[im 23/68]
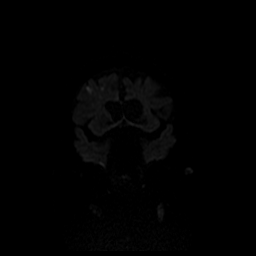
[im 34/68]
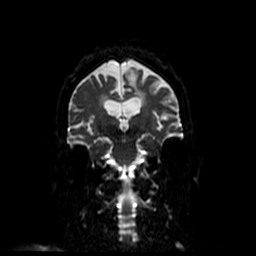
[im 45/68]
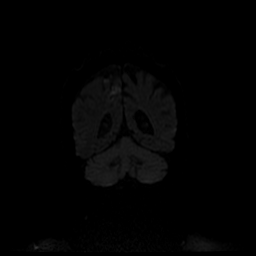
[im 56/68]
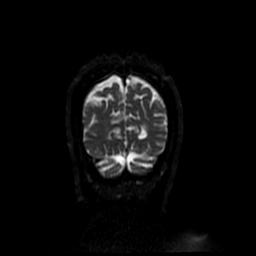
[im 68/68]
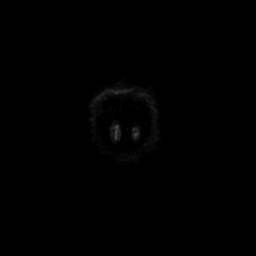

[Series 5: T1 · sagittal · 5.0mm · 0.47mm/px · 2 of 23 slices shown (1 of 2)]
[im 1/23]
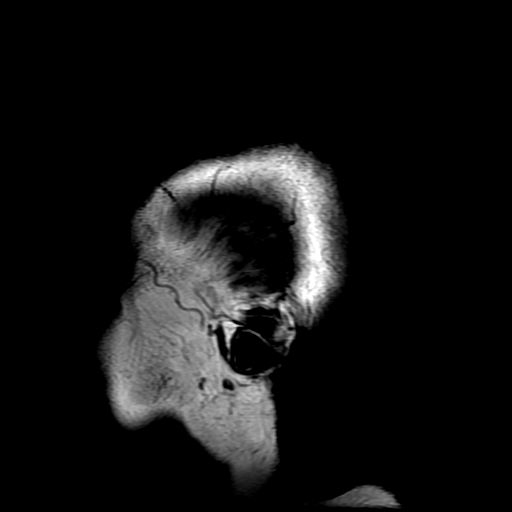
[im 23/23]
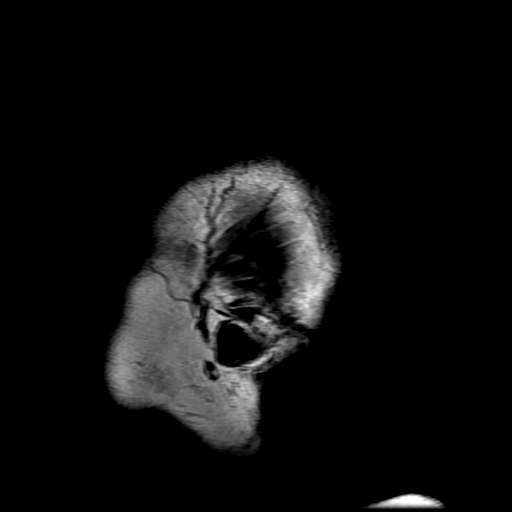

[Series 6: T2 · axial · 5.0mm · 0.43mm/px · z∈[-61,+76]mm · 2 of 24 slices shown (1 of 2)]
[im 1/24]
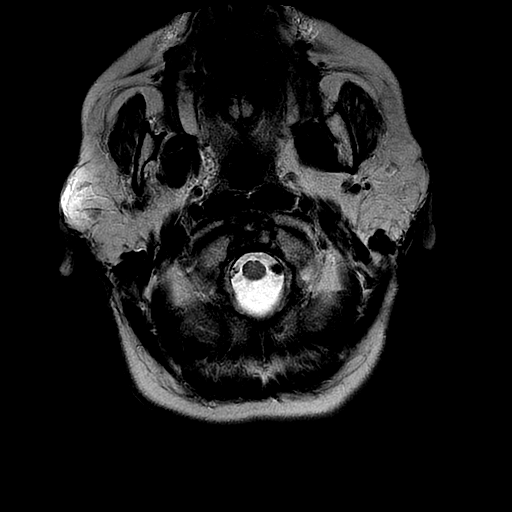
[im 24/24]
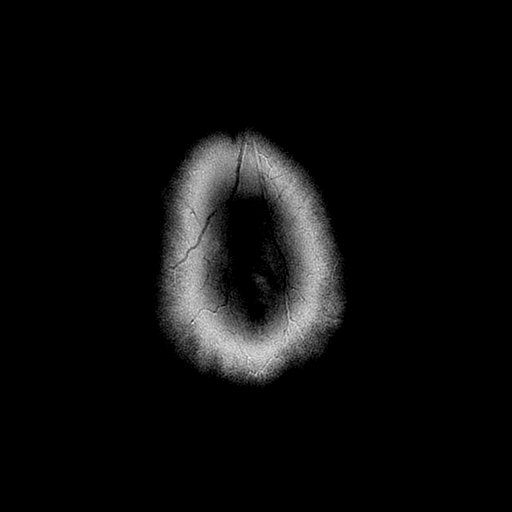

[Series 7: FLAIR · axial · 5.0mm · 0.43mm/px · z∈[-61,+76]mm · 2 of 24 slices shown]
[im 1/24]
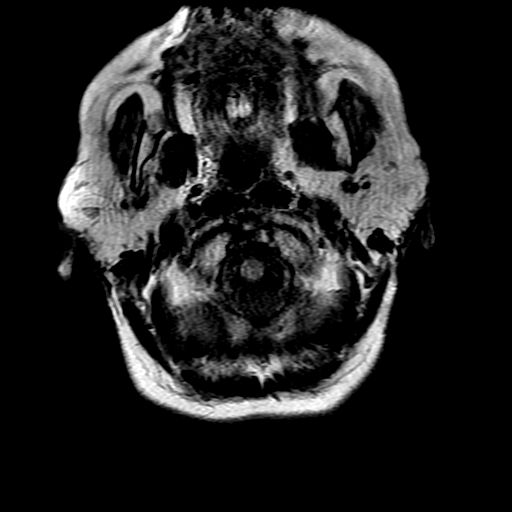
[im 24/24]
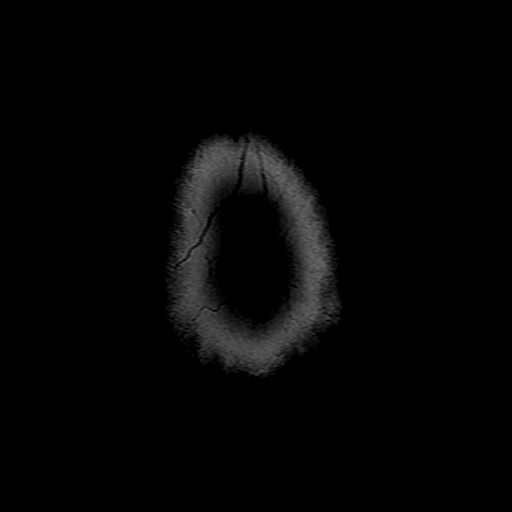

[Series 9: T1 · axial · 3.0mm · 0.47mm/px · 1 of 96 slices shown (2 of 2)]
[im 1/96]
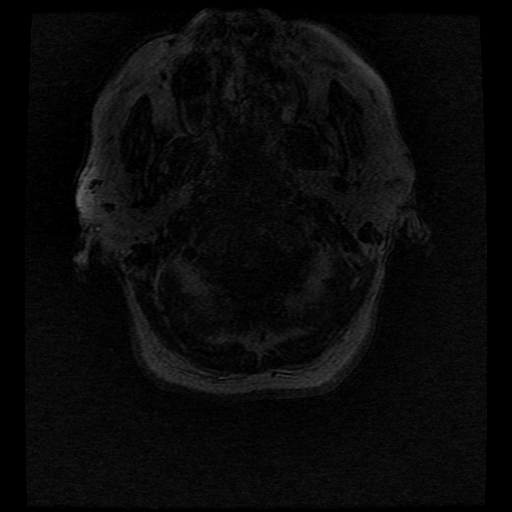

[Series 11: T2 · coronal · 5.0mm · 0.43mm/px · 3 of 30 slices shown (2 of 2)]
[im 1/30]
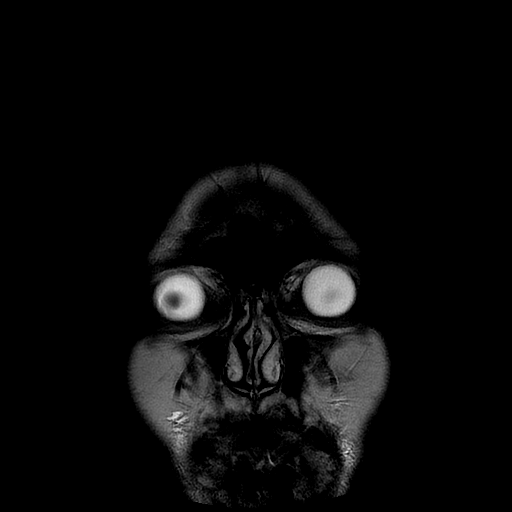
[im 15/30]
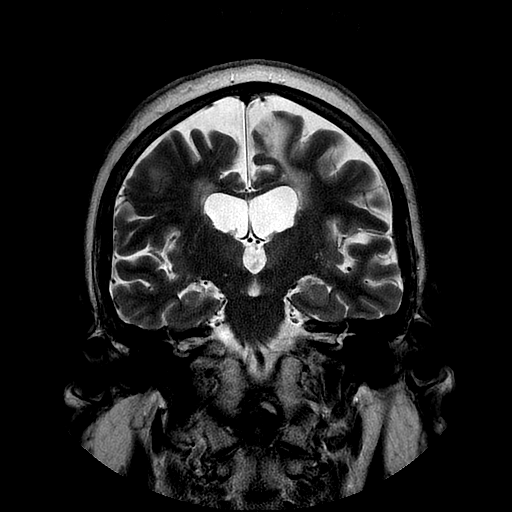
[im 30/30]
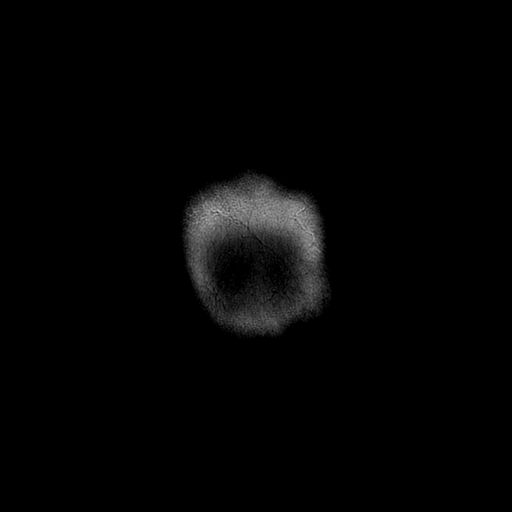

[Series 300: DWI · axial · 3.0mm · 1.09mm/px · z∈[-58,+77]mm · 5 of 46 slices shown (3 of 4)]
[im 1/46]
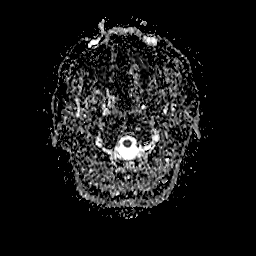
[im 12/46]
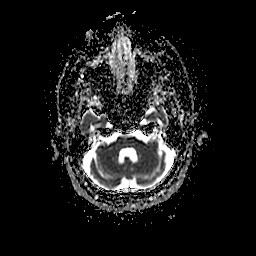
[im 23/46]
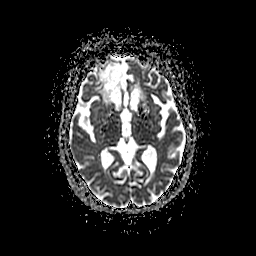
[im 34/46]
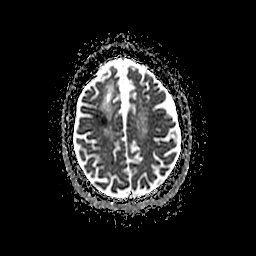
[im 46/46]
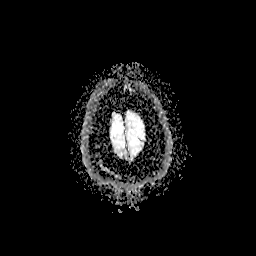

[Series 400: DWI · coronal · 5.0mm · 1.09mm/px · 4 of 34 slices shown (4 of 4)]
[im 1/34]
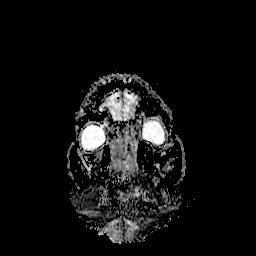
[im 12/34]
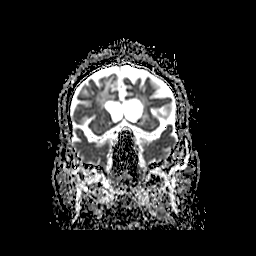
[im 23/34]
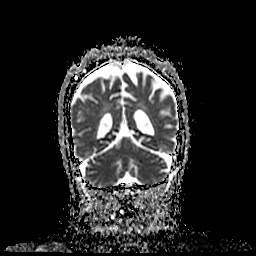
[im 34/34]
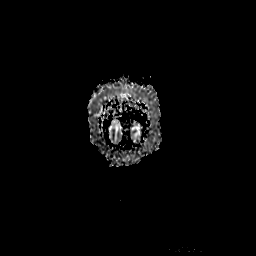

[37 of 48 positions shown; findings below may reference images not displayed]

FINDINGS: Brain: Numerous small cortical and subcortical acute infarcts in the
high right frontal and parietal lobes. Additional small acute
infarct in the left occipital lobe. Mild edema without significant
mass effect. Remote right greater than left bifrontal infarcts with
associated encephalomalacia and gliosis. Moderate additional
scattered T2/FLAIR hyperintensities within the white matter, most
likely related to chronic microvascular ischemic disease. No
evidence of acute hemorrhage. Generalized atrophy with ex vacuo
ventricular dilation. No hydrocephalus. No extra-axial fluid
collections. No midline shift. Basal cisterns are patent.

Vascular: Better evaluated on same day CTA.

Skull and upper cervical spine: Normal marrow signal. Mild ethmoid
air cell mucosal thickening. Unremarkable orbits.

Sinuses/Orbits: Mild ethmoid air cell mucosal thickening.
Unremarkable orbits.

Other: No sizable mastoid effusions.
IMPRESSION: 1. Numerous small cortical and subcortical acute infarcts in the
high right frontal and parietal lobes, likely predominantly right
ACA territory although a component of right ACA/MCA watershed
ischemia is possible. Mild edema without significant mass effect.
2. Additional small acute infarct in the left occipital lobe.
3. Remote right greater than left bifrontal infarcts.

## 2020-08-27 MED ORDER — ASPIRIN 81 MG PO CHEW
324.0000 mg | CHEWABLE_TABLET | Freq: Once | ORAL | Status: AC
  Administered 2020-08-27: 324 mg via ORAL
  Filled 2020-08-27: qty 4

## 2020-08-27 MED ORDER — FLUTICASONE PROPIONATE 50 MCG/ACT NA SUSP: 1.0000 | Freq: Every day | NASAL | Status: DC | PRN

## 2020-08-27 MED ORDER — BUPROPION HCL ER (XL) 300 MG PO TB24
300.0000 mg | ORAL_TABLET | Freq: Every day | ORAL | Status: DC
  Administered 2020-08-27: 300 mg via ORAL
  Filled 2020-08-27 (×2): qty 1

## 2020-08-27 MED ORDER — CLOPIDOGREL BISULFATE 75 MG PO TABS
75.0000 mg | ORAL_TABLET | Freq: Every day | ORAL | Status: DC
  Administered 2020-08-27 – 2020-08-28 (×2): 75 mg via ORAL
  Filled 2020-08-27 (×3): qty 1

## 2020-08-27 MED ORDER — ACETAMINOPHEN 160 MG/5ML PO SOLN: 650.0000 mg | ORAL | Status: DC | PRN

## 2020-08-27 MED ORDER — ENOXAPARIN SODIUM 40 MG/0.4ML IJ SOSY
40.0000 mg | PREFILLED_SYRINGE | INTRAMUSCULAR | Status: DC
  Administered 2020-08-27 – 2020-08-28 (×2): 40 mg via SUBCUTANEOUS
  Filled 2020-08-27 (×2): qty 0.4

## 2020-08-27 MED ORDER — LACTATED RINGERS IV SOLN: INTRAVENOUS | Status: AC

## 2020-08-27 MED ORDER — ACETAMINOPHEN 650 MG RE SUPP: 650.0000 mg | RECTAL | Status: DC | PRN

## 2020-08-27 MED ORDER — SENNOSIDES-DOCUSATE SODIUM 8.6-50 MG PO TABS: 1.0000 | ORAL_TABLET | Freq: Every evening | ORAL | Status: DC | PRN

## 2020-08-27 MED ORDER — ASPIRIN 325 MG PO TABS
325.0000 mg | ORAL_TABLET | Freq: Every day | ORAL | Status: DC
  Administered 2020-08-27 – 2020-08-28 (×2): 325 mg via ORAL
  Filled 2020-08-27 (×2): qty 1

## 2020-08-27 MED ORDER — STROKE: EARLY STAGES OF RECOVERY BOOK: Freq: Once | Status: AC

## 2020-08-27 MED ORDER — ATORVASTATIN CALCIUM 40 MG PO TABS
40.0000 mg | ORAL_TABLET | Freq: Every day | ORAL | Status: DC
  Administered 2020-08-27 – 2020-08-28 (×2): 40 mg via ORAL
  Filled 2020-08-27 (×2): qty 1

## 2020-08-27 MED ORDER — LACTATED RINGERS IV SOLN: INTRAVENOUS | Status: DC

## 2020-08-27 MED ORDER — PANTOPRAZOLE SODIUM 40 MG PO TBEC
40.0000 mg | DELAYED_RELEASE_TABLET | Freq: Every day | ORAL | Status: DC
  Administered 2020-08-27 – 2020-08-28 (×2): 40 mg via ORAL
  Filled 2020-08-27 (×2): qty 1

## 2020-08-27 MED ORDER — GABAPENTIN 100 MG PO CAPS
100.0000 mg | ORAL_CAPSULE | Freq: Three times a day (TID) | ORAL | Status: DC | PRN
  Administered 2020-08-28: 100 mg via ORAL
  Filled 2020-08-27: qty 1

## 2020-08-27 MED ORDER — IOHEXOL 350 MG/ML SOLN
100.0000 mL | Freq: Once | INTRAVENOUS | Status: AC | PRN
  Administered 2020-08-27: 100 mL via INTRAVENOUS

## 2020-08-27 MED ORDER — SERTRALINE HCL 100 MG PO TABS
100.0000 mg | ORAL_TABLET | Freq: Every day | ORAL | Status: DC
  Administered 2020-08-27: 100 mg via ORAL
  Filled 2020-08-27: qty 1

## 2020-08-27 MED ORDER — ACETAMINOPHEN 325 MG PO TABS: 650.0000 mg | ORAL_TABLET | ORAL | Status: DC | PRN

## 2020-08-27 MED ORDER — LORATADINE 10 MG PO TABS
10.0000 mg | ORAL_TABLET | Freq: Every day | ORAL | Status: DC | PRN
  Administered 2020-08-28: 10 mg via ORAL
  Filled 2020-08-27: qty 1

## 2020-08-27 NOTE — ED Notes (Signed)
Echocardiogram at bedside.

## 2020-08-27 NOTE — ED Provider Notes (Signed)
[facility] EMERGENCY DEPARTMENT Provider Note   CSN: [id number] Arrival date & time: 08/27/20  0403     History No chief complaint on file.   Ariel [name] is a 75 y.o. female.  Level 5 caveat for acuity of condition.  Patient brought in by EMS as code stroke.  Patient woke up about 1 AM with generalized weakness and had to crawl was unable to walk.  She was able to make it to the commode after about 2 hours.  She was too weak to get off the commode and EMS was called.  They found her to have left-sided weakness and difficulty holding up her arm and leg against gravity.  Patient went to bed at 10 PM and was normal at that time.  She denies any fall or trauma.  She denies any chest pain or abdominal pain.  Denies any difficulty speaking or difficulty swallowing.  Complains of weakness to her left arm and left leg. Denies any blood thinner use.  Denies abdominal pain, nausea or vomiting.  The history is provided by the patient and the EMS personnel.      Past Medical History:  Diagnosis Date   Hypertension    Stroke [facility])     There are no problems to display for this patient.   No past surgical history on file.   OB History   No obstetric history on file.     No family history on file.  Social History   Tobacco Use   Smoking status: Never   Smokeless tobacco: Never  Vaping Use   Vaping Use: Never used  Substance Use Topics   Alcohol use: Never   Drug use: Never    Home Medications Prior to Admission medications   Medication Sig Start Date End Date Taking? Authorizing Provider  ondansetron (ZOFRAN ODT) 4 MG disintegrating tablet Take 1 tablet (4 mg total) by mouth every 8 (eight) hours as needed for nausea or vomiting. 03/31/18   Harvest Dark, MD    Allergies    Patient has no allergy information on record.  Review of Systems   Review of Systems  Unable to perform ROS: Acuity of condition   Physical Exam Updated Vital Signs BP  122/65   Pulse 70   Temp 98.2 F (36.8 C) (Oral)   Resp 20   SpO2 96%   Physical Exam Vitals and nursing note reviewed.  Constitutional:      General: She is not in acute distress.    Appearance: She is well-developed.  HENT:     Head: Normocephalic and atraumatic.     Mouth/Throat:     Pharynx: No oropharyngeal exudate.  Eyes:     Conjunctiva/sclera: Conjunctivae normal.     Pupils: Pupils are equal, round, and reactive to light.  Neck:     Comments: No meningismus. Cardiovascular:     Rate and Rhythm: Normal rate and regular rhythm.     Heart sounds: Normal heart sounds. No murmur heard. Pulmonary:     Effort: Pulmonary effort is normal. No respiratory distress.     Breath sounds: Normal breath sounds.  Abdominal:     Palpations: Abdomen is soft.     Tenderness: There is no abdominal tenderness. There is no guarding or rebound.  Musculoskeletal:        General: No tenderness. Normal range of motion.     Cervical back: Normal range of motion and neck supple.  Skin:    General: Skin is  warm.  Neurological:     Mental Status: She is alert and oriented to person, place, and time.     Cranial Nerves: No cranial nerve deficit.     Sensory: Sensory deficit present.     Motor: Weakness present. No abnormal muscle tone.     Coordination: Coordination normal.     Comments: No facial droop.  Tongue is midline.  Smile is symmetric.  Difficulty holding up left arm against gravity, pronator drift is present. Able to hold legs off bed bilaterally against gravity. Equal ankle flexion and extension  Psychiatric:        Behavior: Behavior normal.    ED Results / Procedures / Treatments   Labs (all labs ordered are listed, but only abnormal results are displayed) Labs Reviewed  CBC - Abnormal; Notable for the following components:      Result Value   RBC 3.85 (*)    Hemoglobin 10.4 (*)    HCT 33.0 (*)    All other components within normal limits  COMPREHENSIVE METABOLIC PANEL  - Abnormal; Notable for the following components:   Glucose, Bld 136 (*)    BUN 26 (*)    Creatinine, Ser 1.53 (*)    Albumin 3.2 (*)    GFR, Estimated 36 (*)    All other components within normal limits  I-STAT CHEM 8, ED - Abnormal; Notable for the following components:   BUN 25 (*)    Creatinine, Ser 1.50 (*)    Glucose, Bld 136 (*)    Calcium, Ion 1.11 (*)    Hemoglobin 10.9 (*)    HCT 32.0 (*)    All other components within normal limits  RESP PANEL BY RT-PCR (FLU A&B, COVID) ARPGX2  ETHANOL  PROTIME-INR  APTT  DIFFERENTIAL  RAPID URINE DRUG SCREEN, HOSP PERFORMED  URINALYSIS, ROUTINE W REFLEX MICROSCOPIC  LIPID PANEL  HEMOGLOBIN A1C    EKG EKG Interpretation  Date/Time:  Friday August 27 2020 04:26:46 EDT Ventricular Rate:  76 PR Interval:  184 QRS Duration: 105 QT Interval:  415 QTC Calculation: 467 R Axis:   70 Text Interpretation: Sinus rhythm Borderline T abnormalities, diffuse leads No previous ECGs available Confirmed by Ezequiel Essex [phone]) on 08/27/2020 4:29:21 AM  Radiology CT HEAD CODE STROKE WO CONTRAST  Result Date: 08/27/2020 CLINICAL DATA:  Code stroke. EXAM: CT HEAD WITHOUT CONTRAST TECHNIQUE: Contiguous axial images were obtained from the base of the skull through the vertex without intravenous contrast. COMPARISON:  None. FINDINGS: Brain: No evidence of acute infarction, hemorrhage, hydrocephalus, extra-axial collection or mass lesion/mass effect. Remote infarct in the parasagittal right frontal lobe, ACA distribution. Much smaller distal left ACA territory infarct in the parasagittal high left frontal cortex and white matter. No hemorrhage, hydrocephalus, or masslike finding Vascular: No hyperdense vessel or unexpected calcification. Skull: Normal. Negative for fracture or focal lesion. Sinuses/Orbits: No acute finding. Other: These results were communicated to Dr. Leonel Ramsay at 4:17 amon 6/24/2022by text page via the [facility] messaging system. ASPECTS  [facility] Stroke Program Early CT Score) Not scored without localizing symptoms IMPRESSION: 1. No acute finding. 2. Remote bilateral ACA distribution infarcts, more extensive on the right. Electronically Signed   By: Monte Fantasia M.D.   On: 08/27/2020 04:18   CT ANGIO HEAD NECK W WO CM W PERF (CODE STROKE)  Result Date: 08/27/2020 CLINICAL DATA:  Stroke workup EXAM: CT ANGIOGRAPHY HEAD AND NECK CT PERFUSION BRAIN TECHNIQUE: Multidetector CT imaging of the head and neck was performed using the  standard protocol during bolus administration of intravenous contrast. Multiplanar CT image reconstructions and MIPs were obtained to evaluate the vascular anatomy. Carotid stenosis measurements (when applicable) are obtained utilizing NASCET criteria, using the distal internal carotid diameter as the denominator. Multiphase CT imaging of the brain was performed following IV bolus contrast injection. Subsequent parametric perfusion maps were calculated using RAPID software. CONTRAST:  142mL OMNIPAQUE IOHEXOL 350 MG/ML SOLN COMPARISON:  Receding noncontrast head CT FINDINGS: CTA NECK FINDINGS Aortic arch: Atheromatous plaque with 3 vessel branching. No acute finding or dilatation. Right carotid system: Scattered atheromatous plaque. No stenosis or ulceration. 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 No ulceration or dissection. Vertebral arteries: Notable low-density plaque at the left proximal subclavian but no flow limiting stenosis or ulceration. Left vertebral artery is smoothly contoured and widely patent to the dura. Very diminutive right vertebral artery with faint flow and essentially no contribution to the basilar. Skeleton: No acute finding Other neck: No acute finding Upper chest: Negative Review of the MIP images confirms the above findings CTA HEAD FINDINGS Anterior circulation: Diffuse atheromatous plaque involving the carotid siphons without  focal and flow limiting stenosis. Severe irregularity of bilateral MCA and especially ACA branches. The ACA show multiple flow gaps due to the degree of severe stenosis. Moderate right M1 segment stenosis. No large vessel occlusion is seen. No evidence of aneurysm. Posterior circulation: Diffuse atheromatous irregularity and undulation of the left vertebral and basilar arteries. Thready flow is seen in the right PCA due to extensive atheromatous plaque. Moderate atheromatous irregularity throughout the left PCA. Negative for aneurysm. Venous sinuses: Negative in the arterial phase Anatomic variants: None significant Review of the MIP images confirms the above findings CT Brain Perfusion Findings: ASPECTS: 10 CBF (<30%) Volume: 25mL Perfusion (Tmax>6.0s) volume: 12mL Mismatch Volume: 67mL IMPRESSION: 1. Severe and generalized intracranial atherosclerosis most heavily affecting the right PCA and bilateral ACA vessels. 2. Thready flow in the non dominant right vertebral artery beginning from its origin. 3. 50% atheromatous narrowing at the proximal left ICA. 4. Delayed perfusion in the right PCA distribution correlating with the severe atheromatous disease. Electronically Signed   By: Monte Fantasia M.D.   On: 08/27/2020 04:40    Procedures .Critical Care  Date/Time: 08/27/2020 6:55 AM Performed by: Ezequiel Essex, MD Authorized by: Ezequiel Essex, MD   Critical care provider statement:    Critical care time (minutes):  35   Critical care was necessary to treat or prevent imminent or life-threatening deterioration of the following conditions:  CNS failure or compromise   Critical care was time spent personally by me on the following activities:  Discussions with consultants, evaluation of patient's response to treatment, examination of patient, ordering and performing treatments and interventions, ordering and review of laboratory studies, ordering and review of radiographic studies, pulse oximetry,  re-evaluation of patient's condition, obtaining history from patient or surrogate and review of old charts   Medications Ordered in ED Medications - No data to display  ED Course  I have reviewed the triage vital signs and the nursing notes.  Pertinent labs & imaging results that were available during my care of the patient were reviewed by me and considered in my medical decision making (see chart for details).    MDM Rules/Calculators/A&P                         Code stroke presenting by EMS, last  seen normal at 10 PM.  Left-sided weakness.  Seen on arrival with Dr. Leonel Ramsay of neurology  CT head is negative for hemorrhage.  CTA shows severe atherosclerotic disease without intervene able large vessel occlusion.  Seen by Dr. Leonel Ramsay of neurology.  Patient not a candidate for intervention.  Her symptoms are improving.  He recommends admission  Labs show mild elevation of creatinine.  Hemoglobin downtrending 2 g from 2 years ago.  Patient denies any black or bloody stools. Her left-sided weakness appears to be improving. Patient not a candidate for tPA or IR per neurology.  Admission discussed with Dr. Nevada Crane Final Clinical Impression(s) / ED Diagnoses Final diagnoses:  Left-sided weakness    Rx / DC Orders ED Discharge Orders     None        Anwen Cannedy, Annie Main, MD 08/27/20 [phone]

## 2020-08-27 NOTE — Evaluation (Signed)
Physical Therapy Evaluation Patient Details Name: Ariel [name] MRN: [id number] DOB: December 16, 1945 Today's Date: 08/27/2020   History of Present Illness  Pt is a 75 y/o female admitted 6/24 secondary to increased L sided numbness and weakness. MRI revealed R frontal and parietal lobe infarcts and L occiptial lobe infarcts. PMH includes  hypertension, CVA, anxiety, depression, and cataracts.  Clinical Impression  Pt admitted secondary to problem above with deficits below. Pt and pt's niece reports sensation and weakness has improved tremendously since admission. Mild functional weakness noted in the LLE and had mild difficulty with advancing LLE during gait. Min to min guard A for mobility tasks. Pt reports no new visual deficits other than baseline cataracts. Pt and pt's niece report they prefer to d/c home with [facility] services. Will continue to follow acutely.     Follow Up Recommendations Home health PT;Supervision for mobility/OOB    Equipment Recommendations  3in1 (PT)    Recommendations for Other Services       Precautions / Restrictions Precautions Precautions: Fall Restrictions Weight Bearing Restrictions: No      Mobility  Bed Mobility Overal bed mobility: Needs Assistance Bed Mobility: Supine to Sit;Sit to Supine     Supine to sit: Min assist Sit to supine: Supervision   General bed mobility comments: Min A for trunk elevation to come to sitting.    Transfers Overall transfer level: Needs assistance Equipment used: 1 person hand held assist Transfers: Sit to/from Stand Sit to Stand: Min assist         General transfer comment: Min A for steadying assist to stand from elevated stretcher height.  Ambulation/Gait Ambulation/Gait assistance: Min guard Gait Distance (Feet): 8 Feet Assistive device: 1 person hand held assist Gait Pattern/deviations: Step-through pattern;Decreased stride length Gait velocity: Decreased   General Gait Details: Noted functional  weakness in LLE and had mild difficulty advancing LLE. Min guard A for safety. No overt LOB noted.  Stairs            Wheelchair Mobility    Modified Rankin (Stroke Patients Only)       Balance Overall balance assessment: Needs assistance Sitting-balance support: No upper extremity supported;Feet supported Sitting balance-Leahy Scale: Good     Standing balance support: Bilateral upper extremity supported;No upper extremity supported Standing balance-Leahy Scale: Poor Standing balance comment: Reliant on UE support                             Pertinent Vitals/Pain Pain Assessment: No/denies pain    Home Living Family/patient expects to be discharged to:: Private residence Living Arrangements: Other relatives (neice) Available Help at Discharge: Family Type of Home: House Home Access: Stairs to enter Entrance Stairs-Rails: None Entrance Stairs-Number of Steps: 2 Home Layout: One level Home Equipment: Environmental consultant - 2 wheels      Prior Function Level of Independence: Needs assistance   Gait / Transfers Assistance Needed: Used walker for ambulation. Pt's niece reports pt is very sedentary and likes to stay in the bed.  ADL's / Homemaking Assistance Needed: Need help for bathing and dressing        Hand Dominance        Extremity/Trunk Assessment   Upper Extremity Assessment Upper Extremity Assessment: Defer to OT evaluation    Lower Extremity Assessment Lower Extremity Assessment: LLE deficits/detail LLE Deficits / Details: Groslly 4/5 throughout. Pt reports sensation feels normal throughout LLE. Did note functional weakness.    Cervical /  Trunk Assessment Cervical / Trunk Assessment: Normal  Communication   Communication: No difficulties  Cognition Arousal/Alertness: Awake/alert Behavior During Therapy: WFL for tasks assessed/performed Overall Cognitive Status: Impaired/Different from baseline Area of Impairment: Problem  solving;Safety/judgement                         Safety/Judgement: Decreased awareness of deficits;Decreased awareness of safety   Problem Solving: Slow processing        General Comments General comments (skin integrity, edema, etc.): Pt's niece present. Pt with cataracts at baseline and reports she is scheduled to have surgery on them within the next month    Exercises     Assessment/Plan    PT Assessment Patient needs continued PT services  PT Problem List Decreased strength;Decreased balance;Decreased mobility;Decreased safety awareness;Decreased knowledge of use of DME;Decreased coordination;Decreased cognition;Decreased knowledge of precautions       PT Treatment Interventions DME instruction;Gait training;Stair training;Therapeutic activities;Functional mobility training;Balance training;Therapeutic exercise;Patient/family education    PT Goals (Current goals can be found in the Care Plan section)  Acute Rehab PT Goals Patient Stated Goal: to go home PT Goal Formulation: With patient/family Time For Goal Achievement: 09/10/20 Potential to Achieve Goals: Good    Frequency Min 4X/week   Barriers to discharge        Co-evaluation               AM-PAC PT "6 Clicks" Mobility  Outcome Measure Help needed turning from your back to your side while in a flat bed without using bedrails?: A Little Help needed moving from lying on your back to sitting on the side of a flat bed without using bedrails?: A Little Help needed moving to and from a bed to a chair (including a wheelchair)?: A Little Help needed standing up from a chair using your arms (e.g., wheelchair or bedside chair)?: A Little Help needed to walk in hospital room?: A Little Help needed climbing 3-5 steps with a railing? : A Little 6 Click Score: 18    End of Session Equipment Utilized During Treatment: Gait belt Activity Tolerance: Patient tolerated treatment well Patient left: in bed;with  call bell/phone within reach;with family/visitor present (on stretcher in ED) Nurse Communication: Mobility status PT Visit Diagnosis: Unsteadiness on feet (R26.81);Muscle weakness (generalized) (M62.81)    Time: 5697-9480 PT Time Calculation (min) (ACUTE ONLY): 20 min   Charges:   PT Evaluation $PT Eval Low Complexity: 1 Low          Lou Miner, DPT  Acute Rehabilitation Services  Pager: [phone] Office: [phone]   Rudean Hitt 08/27/2020, 3:01 PM

## 2020-08-27 NOTE — ED Notes (Addendum)
Back from MRI and blood drawn.

## 2020-08-27 NOTE — Progress Notes (Signed)
  Echocardiogram 2D Echocardiogram has been performed.  Ariel [name] 08/27/2020, 10:08 AM

## 2020-08-27 NOTE — ED Triage Notes (Signed)
BIB [facility] from Home. CODE STROKE. LKW 2200. Pt woke up at 0100 and had weakness and couldn't get out of bed. She crawled out of bed and slept on the floor. Woke up again at 0300 and could walk and stand but felt more weak than earlier. Pt walked to the bathroom and sat on the toilet. Was unable to get off the toilet so she called her daughter who called EMS. When EMS arrived they noticed left arm and leg weakness. Hx: TIA 2 years ago.   117/60 70 heart rate 96% CBG 172

## 2020-08-27 NOTE — Progress Notes (Addendum)
STROKE TEAM PROGRESS NOTE   INTERVAL HISTORY Her niece is at the bedside. Stroke work up underway. Pt has improved left side weakness since admit.  MRI scan shows multiple small cortical and subcortical infarcts in the right ACA, ACA/MCA watershed and left occipital regions likely of embolic etiology. Vitals:   08/27/20 0930 08/27/20 1238 08/27/20 1239 08/27/20 1551  BP: 132/65 131/61  (!) 133/59  Pulse: 61 60 61 68  Resp: 14 16 15 20   Temp:  98.2 F (36.8 C)  98.6 F (37 C)  TempSrc:  Oral    SpO2: 98% 98% 97% 97%  Weight:  68 kg    Height:  5\' 6"  (1.676 m)     CBC:  Recent Labs  Lab 08/27/20 0410 08/27/20 0417  WBC 9.8  --   NEUTROABS 5.4  --   HGB 10.4* 10.9*  HCT 33.0* 32.0*  MCV 85.7  --   PLT 256  --    Basic Metabolic Panel:  Recent Labs  Lab 08/27/20 0410 08/27/20 0417  NA 137 138  K 3.8 3.9  CL 103 107  CO2 24  --   GLUCOSE 136* 136*  BUN 26* 25*  CREATININE 1.53* 1.50*  CALCIUM 9.1  --    Lipid Panel:  Recent Labs  Lab 08/27/20 1223  CHOL 226*  TRIG 149  HDL 52  CHOLHDL 4.3  VLDL 30  LDLCALC 144*   HgbA1c:  Recent Labs  Lab 08/27/20 1223  HGBA1C 6.1*   Urine Drug Screen:  Recent Labs  Lab 08/27/20 1413  LABOPIA NONE DETECTED  COCAINSCRNUR NONE DETECTED  LABBENZ NONE DETECTED  AMPHETMU NONE DETECTED  THCU NONE DETECTED  LABBARB NONE DETECTED    Alcohol Level  Recent Labs  Lab 08/27/20 0410  ETH <10    IMAGING past 24 hours MR BRAIN WO CONTRAST  Result Date: 08/27/2020 CLINICAL DATA:  Neuro deficit, acute stroke suspected. EXAM: MRI HEAD WITHOUT CONTRAST TECHNIQUE: Multiplanar, multiecho pulse sequences of the brain and surrounding structures were obtained without intravenous contrast. COMPARISON:  Same day CT and CTA FINDINGS: Brain: Numerous small cortical and subcortical acute infarcts in the high right frontal and parietal lobes. Additional small acute infarct in the left occipital lobe. Mild edema without significant mass  effect. Remote right greater than left bifrontal infarcts with associated encephalomalacia and gliosis. Moderate additional scattered T2/FLAIR hyperintensities within the white matter, most likely related to chronic microvascular ischemic disease. No evidence of acute hemorrhage. Generalized atrophy with ex vacuo ventricular dilation. No hydrocephalus. No extra-axial fluid collections. No midline shift. Basal cisterns are patent. Vascular: Better evaluated on same day CTA. Skull and upper cervical spine: Normal marrow signal. Mild ethmoid air cell mucosal thickening. Unremarkable orbits. Sinuses/Orbits: Mild ethmoid air cell mucosal thickening. Unremarkable orbits. Other: No sizable mastoid effusions. IMPRESSION: 1. Numerous small cortical and subcortical acute infarcts in the high right frontal and parietal lobes, likely predominantly right ACA territory although a component of right ACA/MCA watershed ischemia is possible. Mild edema without significant mass effect. 2. Additional small acute infarct in the left occipital lobe. 3. Remote right greater than left bifrontal infarcts. Electronically Signed   By: Margaretha Sheffield MD   On: 08/27/2020 12:05   ECHOCARDIOGRAM COMPLETE BUBBLE STUDY  Result Date: 08/27/2020    ECHOCARDIOGRAM REPORT   Patient Name:   Ariel [name] Date of Exam: 08/27/2020 Medical Rec #:  [id number]     Height:       66.0 in Accession #:  [id number]    Weight:       144.0 lb Date of Birth:  11-25-1945     BSA:          1.739 m Patient Age:    75 years      BP:           127/64 mmHg Patient Gender: F             HR:           68 bpm. Exam Location:  Inpatient Procedure: 2D Echo, 3D Echo, Cardiac Doppler, Color Doppler and Saline Contrast            Bubble Study Indications:    Stroke  History:        Patient has no prior history of Echocardiogram examinations.                 Risk Factors:Hypertension.  Sonographer:    Roseanna Rainbow RDCS Referring Phys: [id number] [place]  Sonographer  Comments: Technically difficult study due to poor echo windows and suboptimal subcostal window. Images off axis medial. IMPRESSIONS  1. Left ventricular ejection fraction, by estimation, is 60 to 65%. The left ventricle has normal function. The left ventricle has no regional wall motion abnormalities. There is mild left ventricular hypertrophy. Left ventricular diastolic parameters are consistent with Grade I diastolic dysfunction (impaired relaxation).  2. Right ventricular systolic function is normal. The right ventricular size is normal. There is normal pulmonary artery systolic pressure.  3. The mitral valve is normal in structure. Mild mitral valve regurgitation. No evidence of mitral stenosis.  4. The aortic valve is tricuspid. Aortic valve regurgitation is not visualized. Mild aortic valve sclerosis is present, with no evidence of aortic valve stenosis.  5. The inferior vena cava is normal in size with greater than 50% respiratory variability, suggesting right atrial pressure of 3 mmHg.  6. Agitated saline contrast bubble study was negative, with no evidence of any interatrial shunt. FINDINGS  Left Ventricle: Left ventricular ejection fraction, by estimation, is 60 to 65%. The left ventricle has normal function. The left ventricle has no regional wall motion abnormalities. The left ventricular internal cavity size was normal in size. There is  mild left ventricular hypertrophy. Left ventricular diastolic parameters are consistent with Grade I diastolic dysfunction (impaired relaxation). Right Ventricle: The right ventricular size is normal. Right ventricular systolic function is normal. There is normal pulmonary artery systolic pressure. The tricuspid regurgitant velocity is 2.44 m/s, and with an assumed right atrial pressure of 3 mmHg,  the estimated right ventricular systolic pressure is 27.2 mmHg. Left Atrium: Left atrial size was normal in size. Right Atrium: Right atrial size was normal in size.  Pericardium: There is no evidence of pericardial effusion. Mitral Valve: The mitral valve is normal in structure. Mild mitral valve regurgitation. No evidence of mitral valve stenosis. Tricuspid Valve: The tricuspid valve is normal in structure. Tricuspid valve regurgitation is mild . No evidence of tricuspid stenosis. Aortic Valve: The aortic valve is tricuspid. Aortic valve regurgitation is not visualized. Mild aortic valve sclerosis is present, with no evidence of aortic valve stenosis. Pulmonic Valve: The pulmonic valve was normal in structure. Pulmonic valve regurgitation is trivial. No evidence of pulmonic stenosis. Aorta: The aortic root is normal in size and structure. Venous: The inferior vena cava is normal in size with greater than 50% respiratory variability, suggesting right atrial pressure of 3 mmHg. IAS/Shunts: The interatrial septum is aneurysmal. The interatrial septum  was not well visualized. Agitated saline contrast was given intravenously to evaluate for intracardiac shunting. Agitated saline contrast bubble study was negative, with no evidence  of any interatrial shunt.  LEFT VENTRICLE PLAX 2D LVIDd:         4.70 cm     Diastology LVIDs:         2.60 cm     LV e' medial:    6.42 cm/s LV PW:         1.10 cm     LV E/e' medial:  10.3 LV IVS:        1.20 cm     LV e' lateral:   8.81 cm/s LVOT diam:     1.90 cm     LV E/e' lateral: 7.5 LV SV:         68 LV SV Index:   39 LVOT Area:     2.84 cm  LV Volumes (MOD) LV vol d, MOD A2C: 47.2 ml LV vol d, MOD A4C: 67.0 ml LV vol s, MOD A2C: 23.4 ml LV vol s, MOD A4C: 21.3 ml LV SV MOD A2C:     23.8 ml LV SV MOD A4C:     67.0 ml LV SV MOD BP:      36.1 ml RIGHT VENTRICLE             IVC RV S prime:     16.10 cm/s  IVC diam: 1.40 cm TAPSE (M-mode): 2.0 cm LEFT ATRIUM           Index       RIGHT ATRIUM          Index LA diam:      3.30 cm 1.90 cm/m  RA Area:     8.95 cm LA Vol (A2C): 38.8 ml 22.31 ml/m RA Volume:   14.80 ml 8.51 ml/m LA Vol (A4C): 32.8 ml  18.86 ml/m  AORTIC VALVE LVOT Vmax:   115.00 cm/s LVOT Vmean:  76.500 cm/s LVOT VTI:    0.239 m  AORTA Ao Root diam: 3.20 cm Ao Asc diam:  3.40 cm MITRAL VALVE               TRICUSPID VALVE MV Area (PHT): 2.66 cm    TR Peak grad:   23.8 mmHg MV Decel Time: 285 msec    TR Vmax:        244.00 cm/s MV E velocity: 66.00 cm/s MV A velocity: 95.90 cm/s  SHUNTS MV E/A ratio:  0.69        Systemic VTI:  0.24 m                            Systemic Diam: 1.90 cm Kirk Ruths MD Electronically signed by Kirk Ruths MD Signature Date/Time: 08/27/2020/12:04:28 PM    Final    CT HEAD CODE STROKE WO CONTRAST  Result Date: 08/27/2020 CLINICAL DATA:  Code stroke. EXAM: CT HEAD WITHOUT CONTRAST TECHNIQUE: Contiguous axial images were obtained from the base of the skull through the vertex without intravenous contrast. COMPARISON:  None. FINDINGS: Brain: No evidence of acute infarction, hemorrhage, hydrocephalus, extra-axial collection or mass lesion/mass effect. Remote infarct in the parasagittal right frontal lobe, ACA distribution. Much smaller distal left ACA territory infarct in the parasagittal high left frontal cortex and white matter. No hemorrhage, hydrocephalus, or masslike finding Vascular: No hyperdense vessel or unexpected calcification. Skull: Normal. Negative for fracture or focal lesion. Sinuses/Orbits: No  acute finding. Other: These results were communicated to Dr. Leonel Ramsay at 4:17 amon 6/24/2022by text page via the [facility] messaging system. ASPECTS [facility] Stroke Program Early CT Score) Not scored without localizing symptoms IMPRESSION: 1. No acute finding. 2. Remote bilateral ACA distribution infarcts, more extensive on the right. Electronically Signed   By: Monte Fantasia M.D.   On: 08/27/2020 04:18   CT ANGIO HEAD NECK W WO CM W PERF (CODE STROKE)  Result Date: 08/27/2020 CLINICAL DATA:  Stroke workup EXAM: CT ANGIOGRAPHY HEAD AND NECK CT PERFUSION BRAIN TECHNIQUE: Multidetector CT imaging of the  head and neck was performed using the standard protocol during bolus administration of intravenous contrast. Multiplanar CT image reconstructions and MIPs were obtained to evaluate the vascular anatomy. Carotid stenosis measurements (when applicable) are obtained utilizing NASCET criteria, using the distal internal carotid diameter as the denominator. Multiphase CT imaging of the brain was performed following IV bolus contrast injection. Subsequent parametric perfusion maps were calculated using RAPID software. CONTRAST:  152mL OMNIPAQUE IOHEXOL 350 MG/ML SOLN COMPARISON:  Receding noncontrast head CT FINDINGS: CTA NECK FINDINGS Aortic arch: Atheromatous plaque with 3 vessel branching. No acute finding or dilatation. Right carotid system: Scattered atheromatous plaque. No stenosis or ulceration. 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 No ulceration or dissection. Vertebral arteries: Notable low-density plaque at the left proximal subclavian but no flow limiting stenosis or ulceration. Left vertebral artery is smoothly contoured and widely patent to the dura. Very diminutive right vertebral artery with faint flow and essentially no contribution to the basilar. Skeleton: No acute finding Other neck: No acute finding Upper chest: Negative Review of the MIP images confirms the above findings CTA HEAD FINDINGS Anterior circulation: Diffuse atheromatous plaque involving the carotid siphons without focal and flow limiting stenosis. Severe irregularity of bilateral MCA and especially ACA branches. The ACA show multiple flow gaps due to the degree of severe stenosis. Moderate right M1 segment stenosis. No large vessel occlusion is seen. No evidence of aneurysm. Posterior circulation: Diffuse atheromatous irregularity and undulation of the left vertebral and basilar arteries. Thready flow is seen in the right PCA due to extensive atheromatous plaque. Moderate  atheromatous irregularity throughout the left PCA. Negative for aneurysm. Venous sinuses: Negative in the arterial phase Anatomic variants: None significant Review of the MIP images confirms the above findings CT Brain Perfusion Findings: ASPECTS: 10 CBF (<30%) Volume: 59mL Perfusion (Tmax>6.0s) volume: 12mL Mismatch Volume: 69mL IMPRESSION: 1. Severe and generalized intracranial atherosclerosis most heavily affecting the right PCA and bilateral ACA vessels. 2. Thready flow in the non dominant right vertebral artery beginning from its origin. 3. 50% atheromatous narrowing at the proximal left ICA. 4. Delayed perfusion in the right PCA distribution correlating with the severe atheromatous disease. Electronically Signed   By: Monte Fantasia M.D.   On: 08/27/2020 04:40    PHYSICAL EXAM General: Appears well-developed pleasant elderly lady;no distress. Psych: Affect appropriate to situation Eyes: No scleral injection HENT: No OP obstrucion Head: Normocephalic.  Cardiovascular: Normal rate and regular rhythm.  Respiratory: Effort normal and breath sounds normal to anterior ascultation GI: Soft.  No distension. There is no tenderness.  Skin: WDI    Neurological Examination Mental Status: Alert, oriented, thought content appropriate.  Speech fluent without evidence of aphasia. Able to follow 3 step commands without difficulty. Cranial Nerves: VFF, PERRL, EOMI, mild right facial weakness noted, sensation intact Motor: Tone and bulk:normal tone throughout; no atrophy noted. Left  arm has drift, but able to hold against gravity but with decreased fine motor skills. Moves both legs off bed with only slight bounce, no clear drift left grip weakness.  Diminished fine motor skills on the left.  Orbits right over left upper extremity. Sensory: light touch intact throughout, bilaterally Deep Tendon Reflexes: 2+ and symmetric throughout Plantars: Right: downgoing   Left: downgoing Cerebellar: normal  finger-to-nose, normal rapid alternating movements and normal heel-to-shin test Gait: did not test  ASSESSMENT/PLAN [name] is a 75 y.o. female with multiple previous strokes, dm2 and debility who recently moved in with her niece. She presented with worsening of left side weakness. She has been non-compliant on home meds since prev strokes.  Stroke: Multiple scattered infarcts throughout many territories; appear cardioembolic. Work up underway. If no Afib seen may need loop.  Code Stroke CT head No acute abnormality, old strokes noted. Small vessel disease. Atrophy. ASPECTS 10.    CTA head & neck Severe general intracranial athro, mostly in RPCA and bilat ACAs. LICA 14% CT perfusion Delay perfusion in Rt PCA; mismatch 77ml MRI  Multiple scattered infarcts throughout many territories 2D Echo EF 60%, mild LVH, Gr1 diastolic dysfunction LDL 481 HgbA1c 6.1 VTE prophylaxis - lovenox    Diet   Diet Heart Room service appropriate? Yes; Fluid consistency: Thin   none prior to admission, now on DAPT  Therapy recommendations:  pending Disposition:  pending  Hypertension Home meds:  Norvasc, coreg Stable Permissive hypertension (OK if < 220/120) but gradually normalize in 5-7 days Long-term BP goal normotensive  Hyperlipidemia Home meds:  none LDL 144, goal < 70 Add Lipitor 40mg   High intensity statin  Continue statin at discharge  Diabetes type II Uncontrolled Home meds:  none HgbA1c 6.1, goal < 7.0 CBGs No results for input(s): GLUCAP in the last 72 hours.  SSI  Other Stroke Risk Factors Advanced Age >/= 24  Former Cigarette smoker Obesity, Body mass index is 24.21 kg/m., BMI >/= 30 associated with increased stroke risk, recommend weight loss, diet and exercise as appropriate  Hx stroke/TIA Family hx stroke  Coronary artery disease Congestive heart failure  Other Active Problems Depression- con't home Wellbutrin and [facility] day # 0  Desiree  Metzger-Cihelka, ARNP-C, ANVP-BC Pager: [phone]   I have personally obtained history,examined this patient, reviewed notes, independently viewed imaging studies, participated in medical decision making and plan of care.ROS completed by me personally and pertinent positives fully documented  I have made any additions or clarifications directly to the above note. Agree with note above.  Patient has by cerebral multiple embolic infarcts likely of cardioembolic source with strong suspicion for paroxysmal A. fib. Recommend dual antiplatelet therapy and continue ongoing stroke work-up and aggressive risk factor modification.  Consider loop recorder at discharge to look for A. fib.  No family available for discussion.  Greater than 50% time during this 35-minute visit was spent on counseling and coordination of care discussion of stroke prevention treatment and discussion with care team. Antony Contras, MD Medical Director [place] Pager: [phone] 08/27/2020 5:26 PM  To contact Stroke Continuity provider, please refer to [email or web address] After hours, contact General Neurology

## 2020-08-27 NOTE — H&P (Signed)
History and Physical    Ariel [name] GNO:[id number] DOB: 1945-03-17 DOA: 08/27/2020  Referring MD/NP/PA: Francia Greaves, DO PCP: Patient, No Pcp Per (Inactive)  Patient coming from: Home via EMS  Chief Complaint: Left-sided numbness and weakness  I have personally briefly reviewed patient's old medical records in [place]   HPI: Ariel [name] is a 75 y.o. female with medical history significant of hypertension, CVA, anxiety, depression, and cataracts who presents with complaints of left-sided numbness and weakness.  She had went to bed around 10 PM last night and woke up around 1 AM after sliding out of bed.  Patient denied having any trauma to her head.  Her niece was able to help her get back in bed.  At around 3 AM she got out of bed to use the restroom using her walker, but fell onto the toilet seat and was unable to get up.  At that time she said that the left arm and leg were numb and weak.  She was unable to get up and her niece was unable to help her get up at this time and EMS was called.  At baseline she reports that her vision is poor and she is scheduled to have cataracts removed sometime next month.  She only gets around with use of walker and denies any residual deficits due to prior "mini strokes".  Denies having any change in chest pain, palpitations, fever, nausea, vomiting, diarrhea, cough, shortness of breath, abdominal pain, dysuria, or loss of consciousness.  She does not take a daily aspirin and is not on anticoagulation.  ED Course: Upon admission into the emergency department patient was seen as a code stroke evaluated by neurology.  CT of the brain without contrast did not note any acute findings CTA of the head and neck noted severe generalized intracranial atherosclerosis possibly affecting the right PCA bilateral ACA vessels, but otherwise no amenable lesion.  Patient was out of the window for tPA.  Labs significant for hemoglobin 10.4, BUN 26, and ceatinine 1.53.   Patient had been given full dose aspirin, atorvastatin 40 mg, tonics, and Zoloft.  Review of Systems  Constitutional:  Negative for fever.  HENT:  Negative for ear discharge and nosebleeds.   Eyes:  Negative for blurred vision, double vision and pain.  Respiratory:  Negative for cough and shortness of breath.   Cardiovascular:  Negative for chest pain and leg swelling.  Gastrointestinal:  Negative for abdominal pain, blood in stool, nausea and vomiting.  Genitourinary:  Negative for dysuria and hematuria.  Musculoskeletal:  Positive for falls.  Skin:  Negative for rash.  Neurological:  Positive for sensory change and focal weakness. Negative for loss of consciousness.  Psychiatric/Behavioral:  Negative for memory loss and substance abuse.    Past Medical History:  Diagnosis Date   Hypertension    Stroke [facility])     Past Surgical History:  Procedure Laterality Date   TUBAL LIGATION     Performed at the age of 75 years old     reports that she has never smoked. She has never used smokeless tobacco. She reports that she does not drink alcohol and does not use drugs.  Allergies  Allergen Reactions   Lisinopril     Other reaction(s): Cough   Perflutren Lipid Microspheres     Other reaction(s): Abdominal Pain, Muscle Pain    Family History  Problem Relation Age of Onset   COPD Mother    Heart disease Father  CVA Maternal Grandmother    Heart disease Paternal Grandmother     Prior to Admission medications   Medication Sig Start Date End Date Taking? Authorizing Provider  amLODipine (NORVASC) 10 MG tablet Take 10 mg by mouth at bedtime. 07/01/20 07/01/21 Yes [provider]  buPROPion (WELLBUTRIN XL) 300 MG 24 hr tablet Take 300 mg by mouth at bedtime.   Yes [provider]  carvedilol (COREG) 12.5 MG tablet Take 12.5 mg by mouth 2 (two) times daily with a meal. 07/08/20 07/08/21 Yes [provider]  esomeprazole (NEXIUM) 40 MG capsule Take 40 mg by  mouth. 07/01/20  Yes [provider]  fexofenadine (ALLEGRA) 180 MG tablet Take 180 mg by mouth daily as needed for allergies.   Yes [provider]  fluticasone (FLONASE) 50 MCG/ACT nasal spray Place 1 spray into both nostrils daily as needed for allergies. 11/03/13 08/28/20 Yes [provider]  gabapentin (NEURONTIN) 100 MG capsule Take 100 mg by mouth 3 (three) times daily as needed (pain). 07/01/20 07/01/21 Yes [provider]  ondansetron (ZOFRAN ODT) 4 MG disintegrating tablet Take 1 tablet (4 mg total) by mouth every 8 (eight) hours as needed for nausea or vomiting. 03/31/18  Yes Harvest Dark, MD  sertraline (ZOLOFT) 100 MG tablet Take 100 mg by mouth at bedtime. 07/08/20 07/08/21 Yes [provider]    Physical Exam:  Constitutional: Elderly female currently in no acute distress Vitals:   08/27/20 0430 08/27/20 0530 08/27/20 0630 08/27/20 0730  BP: 131/73 139/69 122/65 127/64  Pulse: 76 63 70 (!) 59  Resp: 15 19 20 13   Temp:      TempSrc:      SpO2: 96% 95% 96% 91%   Eyes: PERRL, lids and conjunctivae normal ENMT: Mucous membranes are moist. Posterior pharynx clear of any exudate or lesions.  Neck: normal, supple, no masses, no thyromegaly Respiratory: clear to auscultation bilaterally, no wheezing, no crackles. Normal respiratory effort. No accessory muscle use.  Cardiovascular: Regular rate and rhythm, no murmurs / rubs / gallops. No extremity edema. 2+ pedal pulses. No carotid bruits.  Abdomen: no tenderness, no masses palpated. No hepatosplenomegaly. Bowel sounds positive.  Musculoskeletal: no clubbing / cyanosis. No joint deformity upper and lower extremities. Good ROM, no contractures. Normal muscle tone.  Skin: no rashes, lesions, ulcers. No induration Neurologic: CN 2-12 grossly intact.  Psychiatric: Normal judgment and insight. Alert and oriented x 3. Normal mood.     Labs on Admission: I have personally reviewed following  labs and imaging studies  CBC: Recent Labs  Lab 08/27/20 0410 08/27/20 0417  WBC 9.8  --   NEUTROABS 5.4  --   HGB 10.4* 10.9*  HCT 33.0* 32.0*  MCV 85.7  --   PLT 256  --    Basic Metabolic Panel: Recent Labs  Lab 08/27/20 0410 08/27/20 0417  NA 137 138  K 3.8 3.9  CL 103 107  CO2 24  --   GLUCOSE 136* 136*  BUN 26* 25*  CREATININE 1.53* 1.50*  CALCIUM 9.1  --    GFR: CrCl cannot be calculated (Unknown ideal weight.). Liver Function Tests: Recent Labs  Lab 08/27/20 0410  AST 22  ALT 25  ALKPHOS 67  BILITOT 0.7  PROT 6.5  ALBUMIN 3.2*   No results for input(s): LIPASE, AMYLASE in the last 168 hours. No results for input(s): AMMONIA in the last 168 hours. Coagulation Profile: Recent Labs  Lab 08/27/20 0410  INR 0.9  Cardiac Enzymes: No results for input(s): CKTOTAL, CKMB, CKMBINDEX, TROPONINI in the last 168 hours. BNP (last 3 results) No results for input(s): PROBNP in the last 8760 hours. HbA1C: No results for input(s): HGBA1C in the last 72 hours. CBG: No results for input(s): GLUCAP in the last 168 hours. Lipid Profile: No results for input(s): CHOL, HDL, LDLCALC, TRIG, CHOLHDL, LDLDIRECT in the last 72 hours. Thyroid Function Tests: No results for input(s): TSH, T4TOTAL, FREET4, T3FREE, THYROIDAB in the last 72 hours. Anemia Panel: No results for input(s): VITAMINB12, FOLATE, FERRITIN, TIBC, IRON, RETICCTPCT in the last 72 hours. Urine analysis:    Component Value Date/Time   COLORURINE STRAW (A) 03/31/2018 1955   APPEARANCEUR CLEAR (A) 03/31/2018 1955   LABSPEC 1.028 03/31/2018 1955   PHURINE 7.0 03/31/2018 1955   GLUCOSEU NEGATIVE 03/31/2018 [place] NEGATIVE 03/31/2018 [place] NEGATIVE 03/31/2018 [place] NEGATIVE 03/31/2018 1955   PROTEINUR NEGATIVE 03/31/2018 1955   NITRITE NEGATIVE 03/31/2018 1955   LEUKOCYTESUR NEGATIVE 03/31/2018 1955   Sepsis Labs: Recent Results (from the past 240 hour(s))  Resp  Panel by RT-PCR (Flu A&B, Covid) Nasopharyngeal Swab     Status: None   Collection Time: 08/27/20  4:06 AM   Specimen: Nasopharyngeal Swab; Nasopharyngeal(NP) swabs in vial transport medium  Result Value Ref Range Status   SARS Coronavirus 2 by RT PCR NEGATIVE NEGATIVE Final    Comment: (NOTE) SARS-CoV-2 target nucleic acids are NOT DETECTED.  The SARS-CoV-2 RNA is generally detectable in upper respiratory specimens during the acute phase of infection. The lowest concentration of SARS-CoV-2 viral copies this assay can detect is 138 copies/mL. A negative result does not preclude SARS-Cov-2 infection and should not be used as the sole basis for treatment or other patient management decisions. A negative result may occur with  improper specimen collection/handling, submission of specimen other than nasopharyngeal swab, presence of viral mutation(s) within the areas targeted by this assay, and inadequate number of viral copies(<138 copies/mL). A negative result must be combined with clinical observations, patient history, and epidemiological information. The expected result is Negative.  Fact Sheet for Patients:  EntrepreneurPulse.com.au  Fact Sheet for Healthcare Providers:  IncredibleEmployment.be  This test is no t yet approved or cleared by the [place] FDA and  has been authorized for detection and/or diagnosis of SARS-CoV-2 by FDA under an Emergency Use Authorization (EUA). This EUA will remain  in effect (meaning this test can be used) for the duration of the COVID-19 declaration under Section 564(b)(1) of the Act, 21 U.S.C.section 360bbb-3(b)(1), unless the authorization is terminated  or revoked sooner.       Influenza A by PCR NEGATIVE NEGATIVE Final   Influenza B by PCR NEGATIVE NEGATIVE Final    Comment: (NOTE) The Xpert Xpress SARS-CoV-2/FLU/RSV plus assay is intended as an aid in the diagnosis of influenza from Nasopharyngeal  swab specimens and should not be used as a sole basis for treatment. Nasal washings and aspirates are unacceptable for Xpert Xpress SARS-CoV-2/FLU/RSV testing.  Fact Sheet for Patients: EntrepreneurPulse.com.au  Fact Sheet for Healthcare Providers: IncredibleEmployment.be  This test is not yet approved or cleared by the [place] FDA and has been authorized for detection and/or diagnosis of SARS-CoV-2 by FDA under an Emergency Use Authorization (EUA). This EUA will remain in effect (meaning this test can be used) for the duration of the COVID-19 declaration under Section 564(b)(1) of the Act, 21 U.S.C. section 360bbb-3(b)(1), unless the authorization is terminated or  revoked.  Performed at [facility], [place] [address], [place], [place] [address]      Radiological Exams on Admission: CT HEAD CODE STROKE WO CONTRAST  Result Date: 08/27/2020 CLINICAL DATA:  Code stroke. EXAM: CT HEAD WITHOUT CONTRAST TECHNIQUE: Contiguous axial images were obtained from the base of the skull through the vertex without intravenous contrast. COMPARISON:  None. FINDINGS: Brain: No evidence of acute infarction, hemorrhage, hydrocephalus, extra-axial collection or mass lesion/mass effect. Remote infarct in the parasagittal right frontal lobe, ACA distribution. Much smaller distal left ACA territory infarct in the parasagittal high left frontal cortex and white matter. No hemorrhage, hydrocephalus, or masslike finding Vascular: No hyperdense vessel or unexpected calcification. Skull: Normal. Negative for fracture or focal lesion. Sinuses/Orbits: No acute finding. Other: These results were communicated to Dr. Leonel Ramsay at 4:17 amon 6/24/2022by text page via the [facility] messaging system. ASPECTS [facility] Stroke Program Early CT Score) Not scored without localizing symptoms IMPRESSION: 1. No acute finding. 2. Remote bilateral ACA distribution infarcts, more extensive on the  right. Electronically Signed   By: Monte Fantasia M.D.   On: 08/27/2020 04:18   CT ANGIO HEAD NECK W WO CM W PERF (CODE STROKE)  Result Date: 08/27/2020 CLINICAL DATA:  Stroke workup EXAM: CT ANGIOGRAPHY HEAD AND NECK CT PERFUSION BRAIN TECHNIQUE: Multidetector CT imaging of the head and neck was performed using the standard protocol during bolus administration of intravenous contrast. Multiplanar CT image reconstructions and MIPs were obtained to evaluate the vascular anatomy. Carotid stenosis measurements (when applicable) are obtained utilizing NASCET criteria, using the distal internal carotid diameter as the denominator. Multiphase CT imaging of the brain was performed following IV bolus contrast injection. Subsequent parametric perfusion maps were calculated using RAPID software. CONTRAST:  155mL OMNIPAQUE IOHEXOL 350 MG/ML SOLN COMPARISON:  Receding noncontrast head CT FINDINGS: CTA NECK FINDINGS Aortic arch: Atheromatous plaque with 3 vessel branching. No acute finding or dilatation. Right carotid system: Scattered atheromatous plaque. No stenosis or ulceration. 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 No ulceration or dissection. Vertebral arteries: Notable low-density plaque at the left proximal subclavian but no flow limiting stenosis or ulceration. Left vertebral artery is smoothly contoured and widely patent to the dura. Very diminutive right vertebral artery with faint flow and essentially no contribution to the basilar. Skeleton: No acute finding Other neck: No acute finding Upper chest: Negative Review of the MIP images confirms the above findings CTA HEAD FINDINGS Anterior circulation: Diffuse atheromatous plaque involving the carotid siphons without focal and flow limiting stenosis. Severe irregularity of bilateral MCA and especially ACA branches. The ACA show multiple flow gaps due to the degree of severe stenosis. Moderate  right M1 segment stenosis. No large vessel occlusion is seen. No evidence of aneurysm. Posterior circulation: Diffuse atheromatous irregularity and undulation of the left vertebral and basilar arteries. Thready flow is seen in the right PCA due to extensive atheromatous plaque. Moderate atheromatous irregularity throughout the left PCA. Negative for aneurysm. Venous sinuses: Negative in the arterial phase Anatomic variants: None significant Review of the MIP images confirms the above findings CT Brain Perfusion Findings: ASPECTS: 10 CBF (<30%) Volume: 11mL Perfusion (Tmax>6.0s) volume: 36mL Mismatch Volume: 55mL IMPRESSION: 1. Severe and generalized intracranial atherosclerosis most heavily affecting the right PCA and bilateral ACA vessels. 2. Thready flow in the non dominant right vertebral artery beginning from its origin. 3. 50% atheromatous narrowing at the proximal left ICA. 4. Delayed perfusion in  the right PCA distribution correlating with the severe atheromatous disease. Electronically Signed   By: Monte Fantasia M.D.   On: 08/27/2020 04:40    EKG: Independently reviewed.  Sinus rhythm at 76 bpm  Assessment/Plan Left-sided weakness secondary to CVA: Acute.  Patient presented with acute onset of left-sided numbness and weakness which started sometime around 3 AM.  She was out of the window for tPA.  Initial CT/CTA of the brain did not note any acute abnormality or large vessel occlusion.  Patient has been started on full dose aspirin.  MRI revealed numerous small cortical and subcortical acute infarcts in the right frontal and parietal lobes with additional small acute infarct of the left parietal lobe. -Admit to telemetry bed -Stroke order set initiated -Neuro checks -Check Hemoglobin A1c(6.1) and lipid panel -PT/OT/Speech to eval and treat -C follow-up echocardiogram -ASA -Appreciate neurology consultative services, will follow-up  -Social work consult   Acute kidney injury superimposed on  chronic kidney disease stage IIIa: Patient with creatinine elevated up to 153 with BUN 26.  Patient baseline creatinine creatinine previously had been 1.1 on 07/08/2020 on [facility] Everywhere records. -Follow-up urinalysis -Check CK -Continue normal saline IV fluids at 75 mL/h -Recheck kidney function in a.m.  Essential hypertension: On admission blood pressures currently stable.  Home blood pressure medications include amlodipine 10 mg nightly and Coreg 12.5 mg twice daily with meals. -Hold home blood pressure medication -Allow for permissive hypertension  Anxiety and depression -continue Wellbutrin and Zoloft  GERD -Continue Protonix  DVT prophylaxis: Lovenox Code Status: Full Family Communication: Patient's niece to be updated over the phone Disposition Plan: To be determined Consults called: Neurology Admission status: Inpatient require more than 2 midnight stay due to acute stroke  Norval Morton MD [facility]   If 7PM-7AM, please contact night-coverage   08/27/2020, 8:49 AM

## 2020-08-27 NOTE — Consult Note (Signed)
Neurology Consultation Reason for Consult: Left-sided weakness Referring Physician: Rancour, S  CC: Left-sided weakness  History is obtained from: Patient  HPI: Ariel [name] is a 75 y.o. female who was in her normal state of health when she went to bed around 10 PM.  When she awoke around 1 AM, she was unable to get out of bed and slid to the floor.  She crawled to her toilet and was able to call from there.  She states that it has been a static deficit since she discovered it, but does have some neglect.  She was taken for an emergent CT/CTA/CTP which did not demonstrate any lesion that would be amenable for intervention.   LKW: 10 PM tpa given?: no, out of window   ROS: A 14 point ROS was performed and is negative except as noted in the HPI.   Past Medical History:  Diagnosis Date   Hypertension    Stroke [facility])      FHx: Mother - DM   Social History:  reports that she has never smoked. She has never used smokeless tobacco. She reports that she does not drink alcohol and does not use drugs.   Exam: Current vital signs: BP 139/66   Pulse 75   Temp 98.2 F (36.8 C) (Oral)   Resp 18   SpO2 100%  Vital signs in last 24 hours: Temp:  [98.2 F (36.8 C)] 98.2 F (36.8 C) (06/24 0426) Pulse Rate:  [75] 75 (06/24 0426) Resp:  [18] 18 (06/24 0426) BP: (139)/(66) 139/66 (06/24 0426) SpO2:  [100 %] 100 % (06/24 0426)   Physical Exam  Constitutional: Appears well-developed and well-nourished.  Psych: Affect appropriate to situation Eyes: No scleral injection HENT: No OP obstruction MSK: no joint deformities.  Cardiovascular: Normal rate and regular rhythm.  Respiratory: Effort normal, non-labored breathing GI: Soft.  No distension. There is no tenderness.  Skin: WDI  Neuro: Mental Status: Patient is awake, alert, oriented to person, place, month, year, and situation. Patient is able to give a clear and coherent history. No signs of aphasia or neglect Cranial  Nerves: II: Visual Fields are full. Pupils are equal, round, and reactive to light.   III,IV, VI: EOMI without ptosis or diploplia.  V: Facial sensation is symmetric to temperature VII: Facial movement with ? Right facial weakness VIII: hearing is intact to voice X: Uvula elevates symmetrically XI: Shoulder shrug is symmetric. XII: tongue is midline without atrophy or fasciculations.  Motor: Tone is normal. Bulk is normal. 5/5 strength was present on the right side, on the left she has 3/5 left arm and 4+/5 left leg weakness.  Sensory: Sensation is symmetric to light touch and temperature in the arms and legs. She extinguishes to DSS.  Cerebellar: FNF intact bilaterally    I have reviewed labs in epic and the results pertinent to this consultation are: Cr 1.5  I have reviewed the images obtained:CT/CTA/CTP - negative.   Impression: 75 year old female with left-sided weakness most consistent with an acute ischemic stroke.  She has a history of multiple previous embolic appearing strokes, with no definite etiology described.  She will need admission for physical therapy and secondary risk factor modification  Recommendations: - HgbA1c, fasting lipid panel - MRI of the brain without contrast - Frequent neuro checks - Echocardiogram - Prophylactic therapy-Antiplatelet med: Aspirin - dose 325mg  PO or 300mg  PR - Risk factor modification - Telemetry monitoring - PT consult, OT consult, Speech consult - Stroke team to  follow  Roland Rack, MD [facility] [phone]  If 7pm- 7am, please page neurology on call as listed in [place].

## 2020-08-27 NOTE — ED Notes (Signed)
Second attempted to call mini lab to have blood drawn. Staff is not answering

## 2020-08-27 NOTE — ED Notes (Signed)
Vital signs stable. 

## 2020-08-27 NOTE — ED Notes (Signed)
Family updated as to patient's status. Patient will be going to MRI when they are ready.

## 2020-08-27 NOTE — ED Notes (Signed)
Physical Therapy working with patient at this time.

## 2020-08-27 NOTE — ED Notes (Signed)
Nurse report given to Osvaldo Human., RN

## 2020-08-27 NOTE — ED Notes (Signed)
Up to bedside commode at this time with one assist. Will hit call button when finished.

## 2020-08-27 NOTE — ED Notes (Signed)
Transported to MRI

## 2020-08-27 NOTE — Code Documentation (Signed)
Stroke Response Nurse Documentation Code Documentation  Ariel [name] is a 75 y.o. female arriving to [facility] ED via [facility] on 6/24 with past medical hx of HTN, CVA without deficit. Code stroke was activated by EMS. Patient from home where she was LKW at 2200 and now complaining of Left sided weakness. On No antithrombotic. Stroke team at the bedside on patient arrival. Labs drawn and patient cleared for CT by Dr. Wyvonnia Dusky. Patient to CT with team. NIHSS 3, see documentation for details and code stroke times. Patient with left arm weakness and Sensory  neglect on exam. The following imaging was completed:  CTA head and neck and CTP. Patient is not a candidate for tPA due to out of window. Bedside handoff with ED RN Ubaldo Glassing.    [name]  Rapid Response RN

## 2020-08-28 ENCOUNTER — Inpatient Hospital Stay (HOSPITAL_BASED_OUTPATIENT_CLINIC_OR_DEPARTMENT_OTHER): Payer: Medicare HMO

## 2020-08-28 ENCOUNTER — Other Ambulatory Visit: Payer: Self-pay | Admitting: Physician Assistant

## 2020-08-28 DIAGNOSIS — I10 Essential (primary) hypertension: Secondary | ICD-10-CM | POA: Diagnosis not present

## 2020-08-28 DIAGNOSIS — I639 Cerebral infarction, unspecified: Secondary | ICD-10-CM | POA: Diagnosis not present

## 2020-08-28 DIAGNOSIS — Z8673 Personal history of transient ischemic attack (TIA), and cerebral infarction without residual deficits: Secondary | ICD-10-CM | POA: Diagnosis not present

## 2020-08-28 DIAGNOSIS — F419 Anxiety disorder, unspecified: Secondary | ICD-10-CM | POA: Diagnosis not present

## 2020-08-28 DIAGNOSIS — R531 Weakness: Secondary | ICD-10-CM | POA: Diagnosis not present

## 2020-08-28 DIAGNOSIS — I6340 Cerebral infarction due to embolism of unspecified cerebral artery: Secondary | ICD-10-CM

## 2020-08-28 DIAGNOSIS — N179 Acute kidney failure, unspecified: Secondary | ICD-10-CM | POA: Diagnosis not present

## 2020-08-28 LAB — RENAL FUNCTION PANEL
Albumin: 3.1 g/dL — ABNORMAL LOW (ref 3.5–5.0)
Anion gap: 8 (ref 5–15)
BUN: 19 mg/dL (ref 8–23)
CO2: 25 mmol/L (ref 22–32)
Calcium: 8.9 mg/dL (ref 8.9–10.3)
Chloride: 105 mmol/L (ref 98–111)
Creatinine, Ser: 1.3 mg/dL — ABNORMAL HIGH (ref 0.44–1.00)
GFR, Estimated: 43 mL/min — ABNORMAL LOW (ref >60)
Glucose, Bld: 124 mg/dL — ABNORMAL HIGH (ref 70–99)
Phosphorus: 3.6 mg/dL (ref 2.5–4.6)
Potassium: 4 mmol/L (ref 3.5–5.1)
Sodium: 138 mmol/L (ref 135–145)

## 2020-08-28 LAB — CBC
HCT: 33.5 % — ABNORMAL LOW (ref 36.0–46.0)
Hemoglobin: 10.5 g/dL — ABNORMAL LOW (ref 12.0–15.0)
MCH: 26.6 pg (ref 26.0–34.0)
MCHC: 31.3 g/dL (ref 30.0–36.0)
MCV: 85 fL (ref 80.0–100.0)
Platelets: 265 10*3/uL (ref 150–400)
RBC: 3.94 MIL/uL (ref 3.87–5.11)
RDW: 15 % (ref 11.5–15.5)
WBC: 8.5 10*3/uL (ref 4.0–10.5)
nRBC: 0 % (ref 0.0–0.2)

## 2020-08-28 MED ORDER — ASPIRIN 325 MG PO TABS: 325.0000 mg | ORAL_TABLET | Freq: Every day | ORAL | 1 refills | Status: DC

## 2020-08-28 MED ORDER — ATORVASTATIN CALCIUM 40 MG PO TABS: 40.0000 mg | ORAL_TABLET | Freq: Every day | ORAL | 1 refills | Status: DC

## 2020-08-28 MED ORDER — CLOPIDOGREL BISULFATE 75 MG PO TABS: 75.0000 mg | ORAL_TABLET | Freq: Every day | ORAL | 0 refills | Status: AC

## 2020-08-28 MED ORDER — DM-GUAIFENESIN ER 30-600 MG PO TB12
1.0000 | ORAL_TABLET | Freq: Two times a day (BID) | ORAL | Status: DC
  Administered 2020-08-28 (×2): 1 via ORAL
  Filled 2020-08-28: qty 1

## 2020-08-28 NOTE — Progress Notes (Addendum)
STROKE TEAM PROGRESS NOTE   INTERVAL HISTORY Her niece is at the bedside. Pt still has mild left sided weakness. PT/OT recommend [facility] PT/OT. She is pending discharge home today. Not able to do loop over the weekend, will recommend 30 day cardiac event monitoring first and if negative, will consider loop recorder as outpt.   Vitals:   08/27/20 1239 08/27/20 1551 08/27/20 2120 08/28/20 0454  BP:  (!) 133/59 (!) 152/68 (!) 152/67  Pulse: 61 68 76 72  Resp: 15 20 18 18   Temp:  98.6 F (37 C) 99.4 F (37.4 C) 98.3 F (36.8 C)  TempSrc:   Oral Oral  SpO2: 97% 97% 96% 95%  Weight:      Height:       CBC:  Recent Labs  Lab 08/27/20 0410 08/27/20 0417 08/28/20 0245  WBC 9.8  --  8.5  NEUTROABS 5.4  --   --   HGB 10.4* 10.9* 10.5*  HCT 33.0* 32.0* 33.5*  MCV 85.7  --  85.0  PLT 256  --  878   Basic Metabolic Panel:  Recent Labs  Lab 08/27/20 0410 08/27/20 0417 08/28/20 0245  NA 137 138 138  K 3.8 3.9 4.0  CL 103 107 105  CO2 24  --  25  GLUCOSE 136* 136* 124*  BUN 26* 25* 19  CREATININE 1.53* 1.50* 1.30*  CALCIUM 9.1  --  8.9  PHOS  --   --  3.6   Lipid Panel:  Recent Labs  Lab 08/27/20 1223  CHOL 226*  TRIG 149  HDL 52  CHOLHDL 4.3  VLDL 30  LDLCALC 144*   HgbA1c:  Recent Labs  Lab 08/27/20 1223  HGBA1C 6.1*   Urine Drug Screen:  Recent Labs  Lab 08/27/20 1413  LABOPIA NONE DETECTED  COCAINSCRNUR NONE DETECTED  LABBENZ NONE DETECTED  AMPHETMU NONE DETECTED  THCU NONE DETECTED  LABBARB NONE DETECTED    Alcohol Level  Recent Labs  Lab 08/27/20 0410  ETH <10    IMAGING past 24 hours No results found.  PHYSICAL EXAM General: Appears well-developed pleasant elderly lady;no distress. Psych: Affect appropriate to situation Eyes: No scleral injection HENT: No OP obstrucion Head: Normocephalic.  Cardiovascular: Normal rate and regular rhythm.  Respiratory: Effort normal and breath sounds normal to anterior ascultation GI: Soft.  No distension.  There is no tenderness.  Skin: WDI    Neurological Examination Mental Status: Alert, oriented, thought content appropriate.  Speech fluent without evidence of aphasia. Able to follow 3 step commands without difficulty. Cranial Nerves: VFF, PERRL, EOMI, mild right facial weakness noted, sensation intact Motor: Tone and bulk:normal tone throughout; no atrophy noted. Left arm has drift, but able to hold against gravity but with decreased fine motor skills. RLE 5/5, LLE 4/5 proximal and 4+/5 distal ankle DF/PF.  Sensory: light touch intact throughout, bilaterally Deep Tendon Reflexes: 2+ and symmetric throughout Plantars: Right: downgoing   Left: downgoing Cerebellar: Right FTN intact, left FTN slight dysmetria Gait: did not test  ASSESSMENT/PLAN Ariel [name] is a 75 y.o. female with multiple previous strokes, dm2 and debility who recently moved in with her niece. She presented with worsening of left side weakness. She has been non-compliant on home meds since prev strokes.  Stroke: Multiple scattered small punctate infarcts bilaterally, etiology unclear but likely cardioembolic Code Stroke CT head No acute abnormality, old strokes noted. Small vessel disease. Atrophy. ASPECTS 10.    CTA head & neck Severe general intracranial  athro, mostly in R PCA and bilat ACAs. LICA 27% CT perfusion Delay perfusion in Rt PCA; mismatch 42ml MRI  Multiple scattered small infarcts throughout many territories. Remote right greater than left bifrontal infarcts. 2D Echo EF 60% LE venous doppler pending Recommend 30 day cardiac event monitoring as outpt to rule out afib. If neg, will need to consider loop recorder.  LDL 144 HgbA1c 6.1 VTE prophylaxis - lovenox none antithrombotics prior to admission, now on DAPT for 3 weeks and then ASA alone.  Therapy recommendations:  HH OT/PT Disposition:  home today likely  Hypertension Home meds:  Norvasc, coreg Stable Long-term BP goal  normotensive  Hyperlipidemia Home meds:  none LDL 144, goal < 70 Add Lipitor 40mg   Continue statin at discharge  Diabetes type II HgbA1c 6.1, goal < 7.0 CBGs SSI Close PCP follow up  Other Stroke Risk Factors Advanced Age >/= 28  Former Cigarette smoker Hx stroke/TIA - no details but on CT and MRI showed Remote right greater than left bifrontal infarcts. Family hx stroke  CAD  Other Active Problems Depression- con't home Wellbutrin and Zoloft  Hospital day # 1  Neurology will sign off. Please call with questions. Pt will follow up with stroke clinic NP at [facility] in about 4 weeks. Thanks for the consult.   Ariel Hawking, MD PhD Stroke Neurology 08/28/2020 1:42 PM   To contact Stroke Continuity provider, please refer to [email or web address] After hours, contact General Neurology

## 2020-08-28 NOTE — Progress Notes (Signed)
Occupational Therapy Evaluation Patient Details Name: Ariel [name] MRN: [id number] DOB: 11/20/1945 Today's Date: 08/28/2020    History of Present Illness Pt is a 75 y/o female admitted 6/24 secondary to increased L sided numbness and weakness. MRI revealed R frontal and parietal lobe infarcts and L occiptial lobe infarcts. PMH includes  hypertension, CVA, anxiety, depression, and cataracts.   Clinical Impression   Prior to admission, pt was living with her niece in a 1-level house with 2 STE. Pt was mod I with functional mobility using RW for support, mod I for grooming/feeding/toileting, and required assistance from niece for bathing/dressing/IADLs (medication management, cooking, cleaning, and transportation). Pt's niece works from home.  Today, pt received semi-reclined in bed, pt's niece present, pt agreeable to OT eval. Pt presents with mild LHB weakness (mostly LUE), improving sensory deficits in LHB, low activity tolerance, mild cognitive deficits, and imbalance. Pt required min assist for bed mobility, min assist-min guard for functional mobility/transfers using RW/vc's, max assist-min guard for LB self-care, and min assist-mod I for UB self-care. Pt tolerated OOB ADLs using RW. Educated pt/niece on BUE HEP (mostly for LUE), optimal positioning of LUE, encouragement to use LUE in functional tasks, cognitive re-orientation, RW safety management, and adaptive ADL strategies. Pt's niece can provide up to 24/7 S/A as needed upon d/c.      Follow Up Recommendations  Home health OT;Supervision/Assistance - 24 hour    Equipment Recommendations  3 in 1 bedside commode;Tub/shower seat;Other (comment) (has RW)    Recommendations for Other Services Other (comment) (None)     Precautions / Restrictions Precautions Precautions: Fall Restrictions Weight Bearing Restrictions: No      Mobility Bed Mobility Overal bed mobility: Needs Assistance Bed Mobility: Supine to Sit     Supine to  sit: Min assist     General bed mobility comments: educated pt on log rolling    Transfers Overall transfer level: Needs assistance Equipment used: Rolling walker (2 wheeled) Transfers: Sit to/from Stand Sit to Stand: Min guard;Min assist         General transfer comment: min assist from lower surfaces, min guard for steadying    Balance Overall balance assessment: Needs assistance Sitting-balance support: No upper extremity supported;Feet supported Sitting balance-Leahy Scale: Good     Standing balance support: Bilateral upper extremity supported;During functional activity Standing balance-Leahy Scale: Fair Standing balance comment: reliant on RW     ADL either performed or assessed with clinical judgement   ADL Overall ADL's : Needs assistance/impaired Eating/Feeding: Modified independent;Sitting (in chair) Eating/Feeding Details (indicate cue type and reason): encouraged pt to incorporate LUE, will introduce build up handle if needed for feeding Grooming: Wash/dry hands;Oral care;Min guard;Cueing for sequencing;Cueing for safety;Standing Grooming Details (indicate cue type and reason): using RW while standing at sink Upper Body Bathing: Minimal assistance;Sitting Upper Body Bathing Details (indicate cue type and reason): simulated EOB Lower Body Bathing: Moderate assistance;Sitting/lateral leans;Sit to/from stand Lower Body Bathing Details (indicate cue type and reason): simulated sitting/standing with RW Upper Body Dressing : Set up;Cueing for sequencing;Sitting Upper Body Dressing Details (indicate cue type and reason): cue for dressing with LUE first, gown management sitting EOB Lower Body Dressing: Minimal assistance;Sitting/lateral leans;Sit to/from stand Lower Body Dressing Details (indicate cue type and reason): for underwear management using RW Toilet Transfer: Minimal assistance;Ambulation;Regular Toilet;Grab bars;RW Armed forces technical officer Details (indicate cue type  and reason): sequencing cues Toileting- Clothing Manipulation and Hygiene: Min guard;Sit to/from stand;Sitting/lateral lean Toileting - Clothing Manipulation Details (indicate cue type and  reason): min guard for perineal care after voiding in toilet using RW for support Tub/ Shower Transfer:  (not assessed) Tub/Shower Transfer Details (indicate cue type and reason): not assessed Functional mobility during ADLs: Min guard;Cueing for safety;Cueing for sequencing;Rolling walker General ADL Comments: with increased time and cues for RW safety management     Vision Baseline Vision/History: Cataracts Patient Visual Report: Other (comment) (having cataract surgery soon) Vision Assessment?: Yes (wears glasses at baseline) Eye Alignment: Within Functional Limits Ocular Range of Motion: Within Functional Limits Alignment/Gaze Preference: Within Defined Limits Tracking/Visual Pursuits: Able to track stimulus in all quads without difficulty Convergence: Within functional limits Additional Comments: blurry vision 2/2 glaucoma     Perception Perception Perception Tested?: No   Praxis Praxis Praxis tested?: Not tested    Pertinent Vitals/Pain Pain Assessment: 0-10 Pain Score: 3  Pain Location: neck, head, back Pain Descriptors / Indicators: Aching Pain Intervention(s): Monitored during session;Repositioned;Relaxation     Hand Dominance Right   Extremity/Trunk Assessment Upper Extremity Assessment Upper Extremity Assessment: LUE deficits/detail LUE Deficits / Details: 3-/5 LUE Sensation: WNL LUE Coordination: decreased fine motor;decreased gross motor   Lower Extremity Assessment Lower Extremity Assessment: Overall WFL for tasks assessed LLE Deficits / Details: grossly 4/5 throughout, LLE slightly more weak than RLE   Cervical / Trunk Assessment Cervical / Trunk Assessment: Normal   Communication Communication Communication: No difficulties   Cognition Arousal/Alertness:  Awake/alert Behavior During Therapy: WFL for tasks assessed/performed Overall Cognitive Status: Impaired/Different from baseline Area of Impairment: Problem solving;Safety/judgement       Safety/Judgement: Decreased awareness of deficits;Decreased awareness of safety   Problem Solving: Slow processing General Comments: cues for reason for admission and date   General Comments  skin intact, no edema    Exercises Exercises: General Upper Extremity (focused on LUE AAROM/AROM exercises shoulder to digits) General Exercises - Upper Extremity Shoulder Flexion: AROM;Both;Supine Shoulder ABduction: AROM;Both;Supine Elbow Flexion: AROM;Both;Supine Elbow Extension: AROM;Both;Supine Wrist Flexion: AROM;Both;Supine Wrist Extension: AROM;Both;Supine Digit Composite Flexion: AROM;Both;Supine Composite Extension: AROM;Both;Supine    Home Living Family/patient expects to be discharged to:: Private residence Living Arrangements: Other relatives (niece) Available Help at Discharge: Family;Available PRN/intermittently Type of Home: House Home Access: Stairs to enter [employer] of Steps: 2 Entrance Stairs-Rails: None Home Layout: One level     Bathroom Shower/Tub: Walk-in shower (threshold)   Bathroom Toilet: Standard Bathroom Accessibility: Yes How Accessible: Accessible via walker Home Equipment: [place] - 2 wheels;Cane - quad       Prior Functioning/Environment Level of Independence: Needs assistance  Gait / Transfers Assistance Needed: Used walker for ambulation. Pt's niece reports pt is very sedentary and likes to stay in the bed. ADL's / Homemaking Assistance Needed: Needs help for bathing and dressing, mod I with grooming/feeding/toileting, dependent on niece on IADLs Communication / Swallowing Assistance Needed: clear for regular diet     OT Problem List: Decreased strength;Decreased activity tolerance;Impaired balance (sitting and/or standing);Decreased  coordination;Decreased cognition;Decreased safety awareness;Decreased knowledge of use of DME or AE;Impaired UE functional use;Pain      OT Treatment/Interventions: Self-care/ADL training;Therapeutic exercise;Neuromuscular education;DME and/or AE instruction;Therapeutic activities;Cognitive remediation/compensation;Patient/family education    OT Goals(Current goals can be found in the care plan section) Acute Rehab OT Goals Patient Stated Goal: to go home OT Goal Formulation: With patient/family Time For Goal Achievement: 09/11/20 Potential to Achieve Goals: Good  OT Frequency: Min 2X/week    AM-PAC OT "6 Clicks" Daily Activity     Outcome Measure Help from another person eating meals?: None Help  from another person taking care of personal grooming?: A Little Help from another person toileting, which includes using toliet, bedpan, or urinal?: A Little Help from another person bathing (including washing, rinsing, drying)?: A Lot Help from another person to put on and taking off regular upper body clothing?: A Little Help from another person to put on and taking off regular lower body clothing?: A Lot 6 Click Score: 17   End of Session Equipment Utilized During Treatment: Gait belt;Rolling walker Nurse Communication: Mobility status  Activity Tolerance: Patient tolerated treatment well Patient left: in chair;with call bell/phone within reach  OT Visit Diagnosis: Unsteadiness on feet (R26.81);Muscle weakness (generalized) (M62.81);Other symptoms and signs involving cognitive function;Pain Pain - Right/Left:  (neck, back, shoulders) Pain - part of body:  (neck, back, shoulders)                Time: 1000-1058 OT Time Calculation (min): 58 min Charges:  OT General Charges $OT Visit: 1 Visit OT Evaluation $OT Eval Moderate Complexity: 1 Mod OT Treatments $Self Care/Home Management : 8-22 mins $Therapeutic Activity: 23-37 mins  Michel Bickers, OTR/L [facility]  [facility] [phone]   Francesca Jewett 08/28/2020, 11:22 AM

## 2020-08-28 NOTE — Discharge Summary (Signed)
Discharge Summary   Ariel [name] GYF:[id number] DOB: 1945/09/26   PCP: Patient, No Pcp Per (Inactive)   Admit date: 08/27/2020 Discharge date: 08/28/2020   Time spent: 35 minutes   Recommendations for Outpatient Follow-up:  Cardiology will follow up with patient and send her a 30-day event monitor. New medication: Plavix 75 mg p.o. daily x20 days New medication: Aspirin 325 mg p.o. daily Medication change: Note Nexium on hold while patient on Plavix New medication: Lipitor 40 mg p.o. daily Patient will with home health PT and OT & 3 in 1 Patient will follow-up with neurology in 6 weeks   Discharge Diagnoses:      Active Hospital Problems    Diagnosis Date Noted   CVA (cerebral vascular accident) ([place]) 08/27/2020   Acute left-sided weakness 08/27/2020   AKI (acute kidney injury) ([place]) 08/27/2020   Benign essential HTN 08/27/2020   Anxiety and depression 08/27/2020   GERD (gastroesophageal reflux disease) 08/27/2020     Resolved Hospital Problems  No resolved problems to display.      Discharge Condition: Improved, being discharged home with home health   Diet recommendation: Heart healthy       Vitals:    08/27/20 2120 08/28/20 0454  BP: (!) 152/68 (!) 152/67  Pulse: 76 72  Resp: 18 18  Temp: 99.4 F (37.4 C) 98.3 F (36.8 C)  SpO2: 96% 95%      History of present illness:  75 year old female with past medical history of hypertension, CVA and depression presented on morning of 6/24 with left-sided numbness and weakness.  Patient had gone to bed the night prior and woke up around 1 AM after sliding out of bed.  Helped back into bed, but then got up at 3 AM to use the restroom using her walker but fell onto the toilet seat and was unable to get up and noticed left-sided weakness.  EMS were called and patient was brought into the emergency room.  Initially seen as a code stroke.  Out of window for tPA.  CT unremarkable and CTA of head/neck noted severe  generalized atherosclerosis possibly affecting right PCA vessels.  Patient admitted to the hospitalist service.  Also noted to have AKI with creatinine 1.53.  Neurology consulted.  MRI noted multiple scattered infarcts throughout many territories consistent with embolic disease.   Hospital Course:  Principal Problem:   CVA (cerebral vascular accident) [facility]) Active Problems:   Acute left-sided weakness   AKI (acute kidney injury) ([place])   Benign essential HTN   Anxiety and depression   GERD (gastroesophageal reflux disease)   CVA (cerebral vascular accident) ([place]) causing acute left-sided weakness: Started on aspirin indefinitely and Plavix for the next 3 weeks.  MRI notes multiple scattered infarcts throughout many territories consistent with emboli.  A1c at 6.1, LDL at 144.  Echocardiogram notes grade 1 diastolic dysfunction.  Patient passed swallow evaluation.  Weakness somewhat improved since admission.  Physical and Occupational Therapy recommending home health.  Discussed with neurology who feels that likely this was embolic and with CT angiogram of neck noting no evidence of significant stenosis, may be paroxysmal atrial fibrillation.  Recommendation is for 30-day event monitor, and if negative, consider loop recorder as outpatient.  Patient will follow up with neurology in 6 weeks   Active Problems:   AKI (acute kidney injury) [facility]): Creatinine 2.5 years ago at 1.03.  1.53 on admission and down to 1.3 today.     Benign essential HTN: On Norvasc and Coreg.  Blood pressure mildly elevated, allowing for some permissive.     Anxiety and depression: Continue home meds.     GERD (gastroesophageal reflux disease): Continue PPI.   Hyperlipidemia: LDL at 144 with goal of less than 70.  Started on Lipitor 40.   Procedures: Echocardiogram done 6/24   Consultations: Neurology Case discussed with cardiology who will set up 30-day event monitor   Discharge Exam: BP (!) 152/67 (BP Location:  Left Arm)   Pulse 72   Temp 98.3 F (36.8 C) (Oral)   Resp 18   Ht 5\' 6"  (1.676 m)   Wt 68 kg   SpO2 95%   BMI 24.21 kg/m    General: Alert and oriented x3, no acute distress Cardiovascular: Regular rate and rhythm, S1-S2 Respiratory: Clear to auscultation bilaterally   Discharge Instructions You were cared for by a hospitalist during your hospital stay. If you have any questions about your discharge medications or the care you received while you were in the hospital after you are discharged, you can call the unit and asked to speak with the hospitalist on call if the hospitalist that took care of you is not available. Once you are discharged, your primary care physician will handle any further medical issues. Please note that NO REFILLS for any discharge medications will be authorized once you are discharged, as it is imperative that you return to your primary care physician (or establish a relationship with a primary care physician if you do not have one) for your aftercare needs so that they can reassess your need for medications and monitor your lab values.   Discharge Instructions       Ambulatory referral to Neurology   Complete by: As directed      Follow up with stroke clinic NP (Jessica Vanschaick or Cecille Rubin, if both not available, consider Zachery Dauer, or [place]) at [facility] in about 4 weeks. Thanks.    Diet - low sodium heart healthy   Complete by: As directed      Increase activity slowly   Complete by: As directed           Allergies as of 08/28/2020         Reactions    Lisinopril      Other reaction(s): Cough    Perflutren Lipid Microspheres      Other reaction(s): Abdominal Pain, Muscle Pain            Medication List       STOP taking these medications     esomeprazole 40 MG capsule Commonly known as: NEXIUM           TAKE these medications     amLODipine 10 MG tablet Commonly known as: NORVASC Take 10 mg by mouth at bedtime.    aspirin  325 MG tablet Take 1 tablet (325 mg total) by mouth daily. Start taking on: August 29, 2020    atorvastatin 40 MG tablet Commonly known as: LIPITOR Take 1 tablet (40 mg total) by mouth daily. Start taking on: August 29, 2020    buPROPion 300 MG 24 hr tablet Commonly known as: WELLBUTRIN XL Take 300 mg by mouth at bedtime.    carvedilol 12.5 MG tablet Commonly known as: COREG Take 12.5 mg by mouth 2 (two) times daily with a meal.    clopidogrel 75 MG tablet Commonly known as: PLAVIX Take 1 tablet (75 mg total) by mouth daily for 20 days. Start taking on: August 29, 2020  fexofenadine 180 MG tablet Commonly known as: ALLEGRA Take 180 mg by mouth daily as needed for allergies.    fluticasone 50 MCG/ACT nasal spray Commonly known as: FLONASE Place 1 spray into both nostrils daily as needed for allergies.    gabapentin 100 MG capsule Commonly known as: NEURONTIN Take 100 mg by mouth 3 (three) times daily as needed (pain).    ondansetron 4 MG disintegrating tablet Commonly known as: Zofran ODT Take 1 tablet (4 mg total) by mouth every 8 (eight) hours as needed for nausea or vomiting.    sertraline 100 MG tablet Commonly known as: ZOLOFT Take 100 mg by mouth at bedtime.                        Durable Medical Equipment  (From admission, onward)                 Start     Ordered    08/28/20 1403   DME 3-in-1  Once        08/28/20 1403    08/28/20 1141   For home use only DME 3 n 1  Once        08/28/20 1140                     Allergies  Allergen Reactions   Lisinopril        Other reaction(s): Cough   Perflutren Lipid Microspheres        Other reaction(s): Abdominal Pain, Muscle Pain      Follow-up Information       [place] [place] Follow up.   Specialty: [place] Why: for home health services Contact information: [place] [address] [phone]              [facility]. Schedule an appointment as soon as possible for a visit in 1 month(s).   Specialty: Neurology Why: stroke clinic Contact information: [address] [place] [phone]                          The results of significant diagnostics from this hospitalization (including imaging, microbiology, ancillary and laboratory) are listed below for reference.    Significant Diagnostic Studies:  Imaging Results  MR BRAIN WO CONTRAST   Result Date: 08/27/2020 CLINICAL DATA:  Neuro deficit, acute stroke suspected. EXAM: MRI HEAD WITHOUT CONTRAST TECHNIQUE: Multiplanar, multiecho pulse sequences of the brain and surrounding structures were obtained without intravenous contrast. COMPARISON:  Same day CT and CTA FINDINGS: Brain: Numerous small cortical and subcortical acute infarcts in the high right frontal and parietal lobes. Additional small acute infarct in the left occipital lobe. Mild edema without significant mass effect. Remote right greater than left bifrontal infarcts with associated encephalomalacia and gliosis. Moderate additional scattered T2/FLAIR hyperintensities within the white matter, most likely related to chronic microvascular ischemic disease. No evidence of acute hemorrhage. Generalized atrophy with ex vacuo ventricular dilation. No hydrocephalus. No extra-axial fluid collections. No midline shift. Basal cisterns are patent. Vascular: Better evaluated on same day CTA. Skull and upper cervical spine: Normal marrow signal. Mild ethmoid air cell mucosal thickening. Unremarkable orbits. Sinuses/Orbits: Mild ethmoid air cell mucosal thickening. Unremarkable orbits. Other: No sizable mastoid effusions. IMPRESSION: 1. Numerous small cortical and subcortical acute infarcts in the high right frontal and parietal lobes, likely predominantly right ACA  territory although a component of right ACA/MCA watershed ischemia is possible. Mild edema without  significant mass effect. 2. Additional small acute infarct in the left occipital lobe. 3. Remote right greater than left bifrontal infarcts. Electronically Signed   By: Margaretha Sheffield MD   On: 08/27/2020 12:05   ECHOCARDIOGRAM COMPLETE BUBBLE STUDY   Result Date: 08/27/2020    ECHOCARDIOGRAM REPORT   Patient Name:   Ariel [name] Date of Exam: 08/27/2020 Medical Rec #:  [id number]     Height:       66.0 in Accession #:    [id number]    Weight:       144.0 lb Date of Birth:  12/12/1945     BSA:          1.739 m Patient Age:    25 years      BP:           127/64 mmHg Patient Gender: F             HR:           68 bpm. Exam Location:  Inpatient Procedure: 2D Echo, 3D Echo, Cardiac Doppler, Color Doppler and Saline Contrast            Bubble Study Indications:    Stroke  History:        Patient has no prior history of Echocardiogram examinations.                 Risk Factors:Hypertension.  Sonographer:    Roseanna Rainbow RDCS Referring Phys: [id number] [place]  Sonographer Comments: Technically difficult study due to poor echo windows and suboptimal subcostal window. Images off axis medial. IMPRESSIONS  1. Left ventricular ejection fraction, by estimation, is 60 to 65%. The left ventricle has normal function. The left ventricle has no regional wall motion abnormalities. There is mild left ventricular hypertrophy. Left ventricular diastolic parameters are consistent with Grade I diastolic dysfunction (impaired relaxation).  2. Right ventricular systolic function is normal. The right ventricular size is normal. There is normal pulmonary artery systolic pressure.  3. The mitral valve is normal in structure. Mild mitral valve regurgitation. No evidence of mitral stenosis.  4. The aortic valve is tricuspid. Aortic valve regurgitation is not visualized. Mild aortic valve sclerosis is present, with no evidence of aortic valve stenosis.  5. The inferior vena cava is normal in size with greater than 50% respiratory  variability, suggesting right atrial pressure of 3 mmHg.  6. Agitated saline contrast bubble study was negative, with no evidence of any interatrial shunt. FINDINGS  Left Ventricle: Left ventricular ejection fraction, by estimation, is 60 to 65%. The left ventricle has normal function. The left ventricle has no regional wall motion abnormalities. The left ventricular internal cavity size was normal in size. There is  mild left ventricular hypertrophy. Left ventricular diastolic parameters are consistent with Grade I diastolic dysfunction (impaired relaxation). Right Ventricle: The right ventricular size is normal. Right ventricular systolic function is normal. There is normal pulmonary artery systolic pressure. The tricuspid regurgitant velocity is 2.44 m/s, and with an assumed right atrial pressure of 3 mmHg,  the estimated right ventricular systolic pressure is 06.3 mmHg. Left Atrium: Left atrial size was normal in size. Right Atrium: Right atrial size was normal in size. Pericardium: There is no evidence of pericardial effusion. Mitral Valve: The mitral valve is normal in structure. Mild mitral valve regurgitation. No evidence of mitral valve stenosis. Tricuspid Valve: The tricuspid valve  is normal in structure. Tricuspid valve regurgitation is mild . No evidence of tricuspid stenosis. Aortic Valve: The aortic valve is tricuspid. Aortic valve regurgitation is not visualized. Mild aortic valve sclerosis is present, with no evidence of aortic valve stenosis. Pulmonic Valve: The pulmonic valve was normal in structure. Pulmonic valve regurgitation is trivial. No evidence of pulmonic stenosis. Aorta: The aortic root is normal in size and structure. Venous: The inferior vena cava is normal in size with greater than 50% respiratory variability, suggesting right atrial pressure of 3 mmHg. IAS/Shunts: The interatrial septum is aneurysmal. The interatrial septum was not well visualized. Agitated saline contrast was given  intravenously to evaluate for intracardiac shunting. Agitated saline contrast bubble study was negative, with no evidence  of any interatrial shunt.  LEFT VENTRICLE PLAX 2D LVIDd:         4.70 cm     Diastology LVIDs:         2.60 cm     LV e' medial:    6.42 cm/s LV PW:         1.10 cm     LV E/e' medial:  10.3 LV IVS:        1.20 cm     LV e' lateral:   8.81 cm/s LVOT diam:     1.90 cm     LV E/e' lateral: 7.5 LV SV:         68 LV SV Index:   39 LVOT Area:     2.84 cm  LV Volumes (MOD) LV vol d, MOD A2C: 47.2 ml LV vol d, MOD A4C: 67.0 ml LV vol s, MOD A2C: 23.4 ml LV vol s, MOD A4C: 21.3 ml LV SV MOD A2C:     23.8 ml LV SV MOD A4C:     67.0 ml LV SV MOD BP:      36.1 ml RIGHT VENTRICLE             IVC RV S prime:     16.10 cm/s  IVC diam: 1.40 cm TAPSE (M-mode): 2.0 cm LEFT ATRIUM           Index       RIGHT ATRIUM          Index LA diam:      3.30 cm 1.90 cm/m  RA Area:     8.95 cm LA Vol (A2C): 38.8 ml 22.31 ml/m RA Volume:   14.80 ml 8.51 ml/m LA Vol (A4C): 32.8 ml 18.86 ml/m  AORTIC VALVE LVOT Vmax:   115.00 cm/s LVOT Vmean:  76.500 cm/s LVOT VTI:    0.239 m  AORTA Ao Root diam: 3.20 cm Ao Asc diam:  3.40 cm MITRAL VALVE               TRICUSPID VALVE MV Area (PHT): 2.66 cm    TR Peak grad:   23.8 mmHg MV Decel Time: 285 msec    TR Vmax:        244.00 cm/s MV E velocity: 66.00 cm/s MV A velocity: 95.90 cm/s  SHUNTS MV E/A ratio:  0.69        Systemic VTI:  0.24 m                            Systemic Diam: 1.90 cm Kirk Ruths MD Electronically signed by Kirk Ruths MD Signature Date/Time: 08/27/2020/12:04:28 PM    Final     CT HEAD CODE STROKE  WO CONTRAST   Result Date: 08/27/2020 CLINICAL DATA:  Code stroke. EXAM: CT HEAD WITHOUT CONTRAST TECHNIQUE: Contiguous axial images were obtained from the base of the skull through the vertex without intravenous contrast. COMPARISON:  None. FINDINGS: Brain: No evidence of acute infarction, hemorrhage, hydrocephalus, extra-axial collection or mass  lesion/mass effect. Remote infarct in the parasagittal right frontal lobe, ACA distribution. Much smaller distal left ACA territory infarct in the parasagittal high left frontal cortex and white matter. No hemorrhage, hydrocephalus, or masslike finding Vascular: No hyperdense vessel or unexpected calcification. Skull: Normal. Negative for fracture or focal lesion. Sinuses/Orbits: No acute finding. Other: These results were communicated to Dr. Leonel Ramsay at 4:17 amon 6/24/2022by text page via the [facility] messaging system. ASPECTS [facility] Stroke Program Early CT Score) Not scored without localizing symptoms IMPRESSION: 1. No acute finding. 2. Remote bilateral ACA distribution infarcts, more extensive on the right. Electronically Signed   By: Monte Fantasia M.D.   On: 08/27/2020 04:18   CT ANGIO HEAD NECK W WO CM W PERF (CODE STROKE)   Result Date: 08/27/2020 CLINICAL DATA:  Stroke workup EXAM: CT ANGIOGRAPHY HEAD AND NECK CT PERFUSION BRAIN TECHNIQUE: Multidetector CT imaging of the head and neck was performed using the standard protocol during bolus administration of intravenous contrast. Multiplanar CT image reconstructions and MIPs were obtained to evaluate the vascular anatomy. Carotid stenosis measurements (when applicable) are obtained utilizing NASCET criteria, using the distal internal carotid diameter as the denominator. Multiphase CT imaging of the brain was performed following IV bolus contrast injection. Subsequent parametric perfusion maps were calculated using RAPID software. CONTRAST:  19mL OMNIPAQUE IOHEXOL 350 MG/ML SOLN COMPARISON:  Receding noncontrast head CT FINDINGS: CTA NECK FINDINGS Aortic arch: Atheromatous plaque with 3 vessel branching. No acute finding or dilatation. Right carotid system: Scattered atheromatous plaque. No stenosis or ulceration. 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 No ulceration or dissection.  Vertebral arteries: Notable low-density plaque at the left proximal subclavian but no flow limiting stenosis or ulceration. Left vertebral artery is smoothly contoured and widely patent to the dura. Very diminutive right vertebral artery with faint flow and essentially no contribution to the basilar. Skeleton: No acute finding Other neck: No acute finding Upper chest: Negative Review of the MIP images confirms the above findings CTA HEAD FINDINGS Anterior circulation: Diffuse atheromatous plaque involving the carotid siphons without focal and flow limiting stenosis. Severe irregularity of bilateral MCA and especially ACA branches. The ACA show multiple flow gaps due to the degree of severe stenosis. Moderate right M1 segment stenosis. No large vessel occlusion is seen. No evidence of aneurysm. Posterior circulation: Diffuse atheromatous irregularity and undulation of the left vertebral and basilar arteries. Thready flow is seen in the right PCA due to extensive atheromatous plaque. Moderate atheromatous irregularity throughout the left PCA. Negative for aneurysm. Venous sinuses: Negative in the arterial phase Anatomic variants: None significant Review of the MIP images confirms the above findings CT Brain Perfusion Findings: ASPECTS: 10 CBF (<30%) Volume: 15mL Perfusion (Tmax>6.0s) volume: 50mL Mismatch Volume: 31mL IMPRESSION: 1. Severe and generalized intracranial atherosclerosis most heavily affecting the right PCA and bilateral ACA vessels. 2. Thready flow in the non dominant right vertebral artery beginning from its origin. 3. 50% atheromatous narrowing at the proximal left ICA. 4. Delayed perfusion in the right PCA distribution correlating with the severe atheromatous disease. Electronically Signed   By: Monte Fantasia M.D.   On: 08/27/2020 04:40  Microbiology:        Recent Results (from the past 240 hour(s))  Resp Panel by RT-PCR (Flu A&B, Covid) Nasopharyngeal Swab     Status: None    Collection  Time: 08/27/20  4:06 AM    Specimen: Nasopharyngeal Swab; Nasopharyngeal(NP) swabs in vial transport medium  Result Value Ref Range Status    SARS Coronavirus 2 by RT PCR NEGATIVE NEGATIVE Final      Comment: (NOTE) SARS-CoV-2 target nucleic acids are NOT DETECTED.   The SARS-CoV-2 RNA is generally detectable in upper respiratory specimens during the acute phase of infection. The lowest concentration of SARS-CoV-2 viral copies this assay can detect is 138 copies/mL. A negative result does not preclude SARS-Cov-2 infection and should not be used as the sole basis for treatment or other patient management decisions. A negative result may occur with improper specimen collection/handling, submission of specimen other than nasopharyngeal swab, presence of viral mutation(s) within the areas targeted by this assay, and inadequate number of viral copies(<138 copies/mL). A negative result must be combined with clinical observations, patient history, and epidemiological information. The expected result is Negative.   Fact Sheet for Patients: EntrepreneurPulse.com.au   Fact Sheet for Healthcare Providers: IncredibleEmployment.be   This test is no t yet approved or cleared by the [place] FDA and has been authorized for detection and/or diagnosis of SARS-CoV-2 by FDA under an Emergency Use Authorization (EUA). This EUA will remain in effect (meaning this test can be used) for the duration of the COVID-19 declaration under Section 564(b)(1) of the Act, 21 U.S.C.section 360bbb-3(b)(1), unless the authorization is terminated or revoked sooner.          Influenza A by PCR NEGATIVE NEGATIVE Final    Influenza B by PCR NEGATIVE NEGATIVE Final      Comment: (NOTE) The Xpert Xpress SARS-CoV-2/FLU/RSV plus assay is intended as an aid in the diagnosis of influenza from Nasopharyngeal swab specimens and should not be used as a sole basis for treatment. Nasal  washings and aspirates are unacceptable for Xpert Xpress SARS-CoV-2/FLU/RSV testing.   Fact Sheet for Patients: EntrepreneurPulse.com.au   Fact Sheet for Healthcare Providers: IncredibleEmployment.be   This test is not yet approved or cleared by the [place] FDA and has been authorized for detection and/or diagnosis of SARS-CoV-2 by FDA under an Emergency Use Authorization (EUA). This EUA will remain in effect (meaning this test can be used) for the duration of the COVID-19 declaration under Section 564(b)(1) of the Act, 21 U.S.C. section 360bbb-3(b)(1), unless the authorization is terminated or revoked.   Performed at [facility], [place] [address], [place], [place] [address]        Labs: Basic Metabolic Panel: Last Labs        Recent Labs  Lab 08/27/20 0410 08/27/20 0417 08/28/20 0245  NA 137 138 138  K 3.8 3.9 4.0  CL 103 107 105  CO2 24  -- 25  GLUCOSE 136* 136* 124*  BUN 26* 25* 19  CREATININE 1.53* 1.50* 1.30*  CALCIUM 9.1  -- 8.9  PHOS  --  -- 3.6      Liver Function Tests: Last Labs       Recent Labs  Lab 08/27/20 0410 08/28/20 0245  AST 22  --  ALT 25  --  ALKPHOS 67  --  BILITOT 0.7  --  PROT 6.5  --  ALBUMIN 3.2* 3.1*      Last Labs   No results for input(s): LIPASE,  AMYLASE in the last 168 hours.   Last Labs   No results for input(s): AMMONIA in the last 168 hours.   CBC: Last Labs        Recent Labs  Lab 08/27/20 0410 08/27/20 0417 08/28/20 0245  WBC 9.8  -- 8.5  NEUTROABS 5.4  --  --  HGB 10.4* 10.9* 10.5*  HCT 33.0* 32.0* 33.5*  MCV 85.7  -- 85.0  PLT 256  -- 265      Cardiac Enzymes: Last Labs      Recent Labs  Lab 08/27/20 1223  CKTOTAL 68      BNP: BNP (last 3 results) Recent Labs (within last 365 days)  No results for input(s): BNP in the last 8760 hours.     ProBNP (last 3 results) Recent Labs (within last 365 days)  No results for input(s): PROBNP in the  last 8760 hours.     CBG: Last Labs   No results for input(s): GLUCAP in the last 168 hours.           Signed:   Annita Brod, MD [facility] 08/28/2020, 2:11 PM             Note Details  Author Annita Brod, MD File Time 08/28/2020  2:16 PM  Author Type Physician Status Signed  Last Editor Annita Brod, [place] # [id number] Admit Date 08/27/2020

## 2020-08-28 NOTE — Progress Notes (Signed)
Bilateral lower extremity venous duplex completed. Refer to "CV Proc" under chart review to view preliminary results.  08/28/2020 2:45 PM Kelby Aline., MHA, RVT, RDCS, RDMS

## 2020-08-28 NOTE — Care Management Obs Status (Signed)
[facility] NOTIFICATION   Patient Details  Name: Ariel [name] MRN: [id number] Date of Birth: June 20, 1945   Medicare Observation Status Notification Given:  Yes    Carles Collet, RN 08/28/2020, 2:35 PM

## 2020-08-28 NOTE — Discharge Planning (Deleted)
Discharge Summary  HANAKO TIPPING TWS:[id number] DOB: [birth year]  PCP: Patient, No Pcp Per (Inactive)  Admit date: 08/27/2020 Discharge date: 08/28/2020  Time spent: 35 minutes  Recommendations for Outpatient Follow-up:  Cardiology will follow up with patient and send her a 30-day event monitor. New medication: Plavix 75 mg p.o. daily x20 days New medication: Aspirin 325 mg p.o. daily Medication change: Note Nexium on hold while patient on Plavix New medication: Lipitor 40 mg p.o. daily Patient will with home health PT and OT & 3 in 1 Patient will follow-up with neurology in 6 weeks  Discharge Diagnoses:  Active Hospital Problems   Diagnosis Date Noted   CVA (cerebral vascular accident) ([place]) 08/27/2020   Acute left-sided weakness 08/27/2020   AKI (acute kidney injury) ([place]) 08/27/2020   Benign essential HTN 08/27/2020   Anxiety and depression 08/27/2020   GERD (gastroesophageal reflux disease) 08/27/2020    Resolved Hospital Problems  No resolved problems to display.    Discharge Condition: Improved, being discharged home with home health  Diet recommendation: Heart healthy  Vitals:   08/27/20 2120 08/28/20 0454  BP: (!) 152/68 (!) 152/67  Pulse: 76 72  Resp: 18 18  Temp: 99.4 F (37.4 C) 98.3 F (36.8 C)  SpO2: 96% 95%    History of present illness:  75 year old female with past medical history of hypertension, CVA and depression presented on morning of 6/24 with left-sided numbness and weakness.  Patient had gone to bed the night prior and woke up around 1 AM after sliding out of bed.  Helped back into bed, but then got up at 3 AM to use the restroom using her walker but fell onto the toilet seat and was unable to get up and noticed left-sided weakness.  EMS were called and patient was brought into the emergency room.  Initially seen as a code stroke.  Out of window for tPA.  CT unremarkable and CTA of head/neck noted severe generalized atherosclerosis possibly  affecting right PCA vessels.  Patient admitted to the hospitalist service.  Also noted to have AKI with creatinine 1.53.  Neurology consulted.  MRI noted multiple scattered infarcts throughout many territories consistent with embolic disease.  Hospital Course:  Principal Problem:   CVA (cerebral vascular accident) [facility]) Active Problems:   Acute left-sided weakness   AKI (acute kidney injury) ([place])   Benign essential HTN   Anxiety and depression   GERD (gastroesophageal reflux disease)   CVA (cerebral vascular accident) ([place]) causing acute left-sided weakness: Started on aspirin indefinitely and Plavix for the next 3 weeks.  MRI notes multiple scattered infarcts throughout many territories consistent with emboli.  A1c at 6.1, LDL at 144.  Echocardiogram notes grade 1 diastolic dysfunction.  Patient passed swallow evaluation.  Weakness somewhat improved since admission.  Physical and Occupational Therapy recommending home health.  Discussed with neurology who feels that likely this was embolic and with CT angiogram of neck noting no evidence of significant stenosis, may be paroxysmal atrial fibrillation.  Recommendation is for 30-day event monitor, and if negative, consider loop recorder as outpatient.  Patient will follow up with neurology in 6 weeks   Active Problems:   AKI (acute kidney injury) [facility]): Creatinine 2.5 years ago at 1.03.  1.53 on admission and down to 1.3 today.     Benign essential HTN: On Norvasc and Coreg.  Blood pressure mildly elevated, allowing for some permissive.     Anxiety and depression: Continue home meds.     GERD (  gastroesophageal reflux disease): Continue PPI.   Hyperlipidemia: LDL at 144 with goal of less than 70.  Started on Lipitor 40.  Procedures: Echocardiogram done 6/24  Consultations: Neurology Case discussed with cardiology who will set up 30-day event monitor  Discharge Exam: BP (!) 152/67 (BP Location: Left Arm)   Pulse 72   Temp 98.3 F (36.8  C) (Oral)   Resp 18   Ht 5\' 6"  (1.676 m)   Wt 68 kg   SpO2 95%   BMI 24.21 kg/m   General: Alert and oriented x3, no acute distress Cardiovascular: Regular rate and rhythm, S1-S2 Respiratory: Clear to auscultation bilaterally  Discharge Instructions You were cared for by a hospitalist during your hospital stay. If you have any questions about your discharge medications or the care you received while you were in the hospital after you are discharged, you can call the unit and asked to speak with the hospitalist on call if the hospitalist that took care of you is not available. Once you are discharged, your primary care physician will handle any further medical issues. Please note that NO REFILLS for any discharge medications will be authorized once you are discharged, as it is imperative that you return to your primary care physician (or establish a relationship with a primary care physician if you do not have one) for your aftercare needs so that they can reassess your need for medications and monitor your lab values.  Discharge Instructions     Ambulatory referral to Neurology   Complete by: As directed    Follow up with stroke clinic NP (Jessica Vanschaick or Cecille Rubin, if both not available, consider Zachery Dauer, or Ahern) at [facility] in about 4 weeks. Thanks.   Diet - low sodium heart healthy   Complete by: As directed    Increase activity slowly   Complete by: As directed       Allergies as of 08/28/2020       Reactions   Lisinopril    Other reaction(s): Cough   Perflutren Lipid Microspheres    Other reaction(s): Abdominal Pain, Muscle Pain        Medication List     STOP taking these medications    esomeprazole 40 MG capsule Commonly known as: NEXIUM       TAKE these medications    amLODipine 10 MG tablet Commonly known as: NORVASC Take 10 mg by mouth at bedtime.   aspirin 325 MG tablet Take 1 tablet (325 mg total) by mouth daily. Start taking on:  August 29, 2020   atorvastatin 40 MG tablet Commonly known as: LIPITOR Take 1 tablet (40 mg total) by mouth daily. Start taking on: August 29, 2020   buPROPion 300 MG 24 hr tablet Commonly known as: WELLBUTRIN XL Take 300 mg by mouth at bedtime.   carvedilol 12.5 MG tablet Commonly known as: COREG Take 12.5 mg by mouth 2 (two) times daily with a meal.   clopidogrel 75 MG tablet Commonly known as: PLAVIX Take 1 tablet (75 mg total) by mouth daily for 20 days. Start taking on: August 29, 2020   fexofenadine 180 MG tablet Commonly known as: ALLEGRA Take 180 mg by mouth daily as needed for allergies.   fluticasone 50 MCG/ACT nasal spray Commonly known as: FLONASE Place 1 spray into both nostrils daily as needed for allergies.   gabapentin 100 MG capsule Commonly known as: NEURONTIN Take 100 mg by mouth 3 (three) times daily as needed (pain).   ondansetron  4 MG disintegrating tablet Commonly known as: Zofran ODT Take 1 tablet (4 mg total) by mouth every 8 (eight) hours as needed for nausea or vomiting.   sertraline 100 MG tablet Commonly known as: ZOLOFT Take 100 mg by mouth at bedtime.               Durable Medical Equipment  (From admission, onward)           Start     Ordered   08/28/20 1403  DME 3-in-1  Once        08/28/20 1403   08/28/20 1141  For home use only DME 3 n 1  Once        08/28/20 1140           Allergies  Allergen Reactions   Lisinopril     Other reaction(s): Cough   Perflutren Lipid Microspheres     Other reaction(s): Abdominal Pain, Muscle Pain    Follow-up Information     [place] Follow up.   Specialty: [place] Why: for home health services Contact information: [place] [address] [phone]         [facility]. Schedule an appointment as soon as possible for a visit in 1 month(s).   Specialty: Neurology Why: stroke clinic Contact  information: [address] [place] [phone]                 The results of significant diagnostics from this hospitalization (including imaging, microbiology, ancillary and laboratory) are listed below for reference.    Significant Diagnostic Studies: MR BRAIN WO CONTRAST  Result Date: 08/27/2020 CLINICAL DATA:  Neuro deficit, acute stroke suspected. EXAM: MRI HEAD WITHOUT CONTRAST TECHNIQUE: Multiplanar, multiecho pulse sequences of the brain and surrounding structures were obtained without intravenous contrast. COMPARISON:  Same day CT and CTA FINDINGS: Brain: Numerous small cortical and subcortical acute infarcts in the high right frontal and parietal lobes. Additional small acute infarct in the left occipital lobe. Mild edema without significant mass effect. Remote right greater than left bifrontal infarcts with associated encephalomalacia and gliosis. Moderate additional scattered T2/FLAIR hyperintensities within the white matter, most likely related to chronic microvascular ischemic disease. No evidence of acute hemorrhage. Generalized atrophy with ex vacuo ventricular dilation. No hydrocephalus. No extra-axial fluid collections. No midline shift. Basal cisterns are patent. Vascular: Better evaluated on same day CTA. Skull and upper cervical spine: Normal marrow signal. Mild ethmoid air cell mucosal thickening. Unremarkable orbits. Sinuses/Orbits: Mild ethmoid air cell mucosal thickening. Unremarkable orbits. Other: No sizable mastoid effusions. IMPRESSION: 1. Numerous small cortical and subcortical acute infarcts in the high right frontal and parietal lobes, likely predominantly right ACA territory although a component of right ACA/MCA watershed ischemia is possible. Mild edema without significant mass effect. 2. Additional small acute infarct in the left occipital lobe. 3. Remote right greater than left bifrontal infarcts. Electronically Signed    By: Margaretha Sheffield MD   On: 08/27/2020 12:05   ECHOCARDIOGRAM COMPLETE BUBBLE STUDY  Result Date: 08/27/2020    ECHOCARDIOGRAM REPORT   Patient Name:   RONYA GILCREST Coover Date of Exam: 08/27/2020 Medical Rec #:  [id number]     Height:       66.0 in Accession #:    [id number]    Weight:       144.0 lb Date of Birth:  1945/11/08     BSA:  1.739 m Patient Age:    89 years      BP:           127/64 mmHg Patient Gender: F             HR:           68 bpm. Exam Location:  Inpatient Procedure: 2D Echo, 3D Echo, Cardiac Doppler, Color Doppler and Saline Contrast            Bubble Study Indications:    Stroke  History:        Patient has no prior history of Echocardiogram examinations.                 Risk Factors:Hypertension.  Sonographer:    Roseanna Rainbow RDCS Referring Phys: [id number] [place]  Sonographer Comments: Technically difficult study due to poor echo windows and suboptimal subcostal window. Images off axis medial. IMPRESSIONS  1. Left ventricular ejection fraction, by estimation, is 60 to 65%. The left ventricle has normal function. The left ventricle has no regional wall motion abnormalities. There is mild left ventricular hypertrophy. Left ventricular diastolic parameters are consistent with Grade I diastolic dysfunction (impaired relaxation).  2. Right ventricular systolic function is normal. The right ventricular size is normal. There is normal pulmonary artery systolic pressure.  3. The mitral valve is normal in structure. Mild mitral valve regurgitation. No evidence of mitral stenosis.  4. The aortic valve is tricuspid. Aortic valve regurgitation is not visualized. Mild aortic valve sclerosis is present, with no evidence of aortic valve stenosis.  5. The inferior vena cava is normal in size with greater than 50% respiratory variability, suggesting right atrial pressure of 3 mmHg.  6. Agitated saline contrast bubble study was negative, with no evidence of any interatrial shunt. FINDINGS  Left  Ventricle: Left ventricular ejection fraction, by estimation, is 60 to 65%. The left ventricle has normal function. The left ventricle has no regional wall motion abnormalities. The left ventricular internal cavity size was normal in size. There is  mild left ventricular hypertrophy. Left ventricular diastolic parameters are consistent with Grade I diastolic dysfunction (impaired relaxation). Right Ventricle: The right ventricular size is normal. Right ventricular systolic function is normal. There is normal pulmonary artery systolic pressure. The tricuspid regurgitant velocity is 2.44 m/s, and with an assumed right atrial pressure of 3 mmHg,  the estimated right ventricular systolic pressure is 63.7 mmHg. Left Atrium: Left atrial size was normal in size. Right Atrium: Right atrial size was normal in size. Pericardium: There is no evidence of pericardial effusion. Mitral Valve: The mitral valve is normal in structure. Mild mitral valve regurgitation. No evidence of mitral valve stenosis. Tricuspid Valve: The tricuspid valve is normal in structure. Tricuspid valve regurgitation is mild . No evidence of tricuspid stenosis. Aortic Valve: The aortic valve is tricuspid. Aortic valve regurgitation is not visualized. Mild aortic valve sclerosis is present, with no evidence of aortic valve stenosis. Pulmonic Valve: The pulmonic valve was normal in structure. Pulmonic valve regurgitation is trivial. No evidence of pulmonic stenosis. Aorta: The aortic root is normal in size and structure. Venous: The inferior vena cava is normal in size with greater than 50% respiratory variability, suggesting right atrial pressure of 3 mmHg. IAS/Shunts: The interatrial septum is aneurysmal. The interatrial septum was not well visualized. Agitated saline contrast was given intravenously to evaluate for intracardiac shunting. Agitated saline contrast bubble study was negative, with no evidence  of any interatrial shunt.  LEFT VENTRICLE  PLAX 2D  LVIDd:         4.70 cm     Diastology LVIDs:         2.60 cm     LV e' medial:    6.42 cm/s LV PW:         1.10 cm     LV E/e' medial:  10.3 LV IVS:        1.20 cm     LV e' lateral:   8.81 cm/s LVOT diam:     1.90 cm     LV E/e' lateral: 7.5 LV SV:         68 LV SV Index:   39 LVOT Area:     2.84 cm  LV Volumes (MOD) LV vol d, MOD A2C: 47.2 ml LV vol d, MOD A4C: 67.0 ml LV vol s, MOD A2C: 23.4 ml LV vol s, MOD A4C: 21.3 ml LV SV MOD A2C:     23.8 ml LV SV MOD A4C:     67.0 ml LV SV MOD BP:      36.1 ml RIGHT VENTRICLE             IVC RV S prime:     16.10 cm/s  IVC diam: 1.40 cm TAPSE (M-mode): 2.0 cm LEFT ATRIUM           Index       RIGHT ATRIUM          Index LA diam:      3.30 cm 1.90 cm/m  RA Area:     8.95 cm LA Vol (A2C): 38.8 ml 22.31 ml/m RA Volume:   14.80 ml 8.51 ml/m LA Vol (A4C): 32.8 ml 18.86 ml/m  AORTIC VALVE LVOT Vmax:   115.00 cm/s LVOT Vmean:  76.500 cm/s LVOT VTI:    0.239 m  AORTA Ao Root diam: 3.20 cm Ao Asc diam:  3.40 cm MITRAL VALVE               TRICUSPID VALVE MV Area (PHT): 2.66 cm    TR Peak grad:   23.8 mmHg MV Decel Time: 285 msec    TR Vmax:        244.00 cm/s MV E velocity: 66.00 cm/s MV A velocity: 95.90 cm/s  SHUNTS MV E/A ratio:  0.69        Systemic VTI:  0.24 m                            Systemic Diam: 1.90 cm Kirk Ruths MD Electronically signed by Kirk Ruths MD Signature Date/Time: 08/27/2020/12:04:28 PM    Final    CT HEAD CODE STROKE WO CONTRAST  Result Date: 08/27/2020 CLINICAL DATA:  Code stroke. EXAM: CT HEAD WITHOUT CONTRAST TECHNIQUE: Contiguous axial images were obtained from the base of the skull through the vertex without intravenous contrast. COMPARISON:  None. FINDINGS: Brain: No evidence of acute infarction, hemorrhage, hydrocephalus, extra-axial collection or mass lesion/mass effect. Remote infarct in the parasagittal right frontal lobe, ACA distribution. Much smaller distal left ACA territory infarct in the parasagittal high left frontal cortex  and white matter. No hemorrhage, hydrocephalus, or masslike finding Vascular: No hyperdense vessel or unexpected calcification. Skull: Normal. Negative for fracture or focal lesion. Sinuses/Orbits: No acute finding. Other: These results were communicated to Dr. Leonel Ramsay at 4:17 amon 6/24/2022by text page via the [facility] messaging system. ASPECTS [facility] Stroke Program Early CT Score) Not scored without localizing  symptoms IMPRESSION: 1. No acute finding. 2. Remote bilateral ACA distribution infarcts, more extensive on the right. Electronically Signed   By: Monte Fantasia M.D.   On: 08/27/2020 04:18   CT ANGIO HEAD NECK W WO CM W PERF (CODE STROKE)  Result Date: 08/27/2020 CLINICAL DATA:  Stroke workup EXAM: CT ANGIOGRAPHY HEAD AND NECK CT PERFUSION BRAIN TECHNIQUE: Multidetector CT imaging of the head and neck was performed using the standard protocol during bolus administration of intravenous contrast. Multiplanar CT image reconstructions and MIPs were obtained to evaluate the vascular anatomy. Carotid stenosis measurements (when applicable) are obtained utilizing NASCET criteria, using the distal internal carotid diameter as the denominator. Multiphase CT imaging of the brain was performed following IV bolus contrast injection. Subsequent parametric perfusion maps were calculated using RAPID software. CONTRAST:  174mL OMNIPAQUE IOHEXOL 350 MG/ML SOLN COMPARISON:  Receding noncontrast head CT FINDINGS: CTA NECK FINDINGS Aortic arch: Atheromatous plaque with 3 vessel branching. No acute finding or dilatation. Right carotid system: Scattered atheromatous plaque. No stenosis or ulceration. 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 No ulceration or dissection. Vertebral arteries: Notable low-density plaque at the left proximal subclavian but no flow limiting stenosis or ulceration. Left vertebral artery is smoothly contoured and widely patent to  the dura. Very diminutive right vertebral artery with faint flow and essentially no contribution to the basilar. Skeleton: No acute finding Other neck: No acute finding Upper chest: Negative Review of the MIP images confirms the above findings CTA HEAD FINDINGS Anterior circulation: Diffuse atheromatous plaque involving the carotid siphons without focal and flow limiting stenosis. Severe irregularity of bilateral MCA and especially ACA branches. The ACA show multiple flow gaps due to the degree of severe stenosis. Moderate right M1 segment stenosis. No large vessel occlusion is seen. No evidence of aneurysm. Posterior circulation: Diffuse atheromatous irregularity and undulation of the left vertebral and basilar arteries. Thready flow is seen in the right PCA due to extensive atheromatous plaque. Moderate atheromatous irregularity throughout the left PCA. Negative for aneurysm. Venous sinuses: Negative in the arterial phase Anatomic variants: None significant Review of the MIP images confirms the above findings CT Brain Perfusion Findings: ASPECTS: 10 CBF (<30%) Volume: 63mL Perfusion (Tmax>6.0s) volume: 52mL Mismatch Volume: 84mL IMPRESSION: 1. Severe and generalized intracranial atherosclerosis most heavily affecting the right PCA and bilateral ACA vessels. 2. Thready flow in the non dominant right vertebral artery beginning from its origin. 3. 50% atheromatous narrowing at the proximal left ICA. 4. Delayed perfusion in the right PCA distribution correlating with the severe atheromatous disease. Electronically Signed   By: Monte Fantasia M.D.   On: 08/27/2020 04:40    Microbiology: Recent Results (from the past 240 hour(s))  Resp Panel by RT-PCR (Flu A&B, Covid) Nasopharyngeal Swab     Status: None   Collection Time: 08/27/20  4:06 AM   Specimen: Nasopharyngeal Swab; Nasopharyngeal(NP) swabs in vial transport medium  Result Value Ref Range Status   SARS Coronavirus 2 by RT PCR NEGATIVE NEGATIVE Final     Comment: (NOTE) SARS-CoV-2 target nucleic acids are NOT DETECTED.  The SARS-CoV-2 RNA is generally detectable in upper respiratory specimens during the acute phase of infection. The lowest concentration of SARS-CoV-2 viral copies this assay can detect is 138 copies/mL. A negative result does not preclude SARS-Cov-2 infection and should not be used as the sole basis for treatment or other patient management decisions. A negative result may occur with  improper  specimen collection/handling, submission of specimen other than nasopharyngeal swab, presence of viral mutation(s) within the areas targeted by this assay, and inadequate number of viral copies(<138 copies/mL). A negative result must be combined with clinical observations, patient history, and epidemiological information. The expected result is Negative.  Fact Sheet for Patients:  EntrepreneurPulse.com.au  Fact Sheet for Healthcare Providers:  IncredibleEmployment.be  This test is no t yet approved or cleared by the [place] FDA and  has been authorized for detection and/or diagnosis of SARS-CoV-2 by FDA under an Emergency Use Authorization (EUA). This EUA will remain  in effect (meaning this test can be used) for the duration of the COVID-19 declaration under Section 564(b)(1) of the Act, 21 U.S.C.section 360bbb-3(b)(1), unless the authorization is terminated  or revoked sooner.       Influenza A by PCR NEGATIVE NEGATIVE Final   Influenza B by PCR NEGATIVE NEGATIVE Final    Comment: (NOTE) The Xpert Xpress SARS-CoV-2/FLU/RSV plus assay is intended as an aid in the diagnosis of influenza from Nasopharyngeal swab specimens and should not be used as a sole basis for treatment. Nasal washings and aspirates are unacceptable for Xpert Xpress SARS-CoV-2/FLU/RSV testing.  Fact Sheet for Patients: EntrepreneurPulse.com.au  Fact Sheet for Healthcare  Providers: IncredibleEmployment.be  This test is not yet approved or cleared by the [place] FDA and has been authorized for detection and/or diagnosis of SARS-CoV-2 by FDA under an Emergency Use Authorization (EUA). This EUA will remain in effect (meaning this test can be used) for the duration of the COVID-19 declaration under Section 564(b)(1) of the Act, 21 U.S.C. section 360bbb-3(b)(1), unless the authorization is terminated or revoked.  Performed at [facility], [place] [address] [place], [place] [address]      Labs: Basic Metabolic Panel: Recent Labs  Lab 08/27/20 0410 08/27/20 0417 08/28/20 0245  NA 137 138 138  K 3.8 3.9 4.0  CL 103 107 105  CO2 24  --  25  GLUCOSE 136* 136* 124*  BUN 26* 25* 19  CREATININE 1.53* 1.50* 1.30*  CALCIUM 9.1  --  8.9  PHOS  --   --  3.6   Liver Function Tests: Recent Labs  Lab 08/27/20 0410 08/28/20 0245  AST 22  --   ALT 25  --   ALKPHOS 67  --   BILITOT 0.7  --   PROT 6.5  --   ALBUMIN 3.2* 3.1*   No results for input(s): LIPASE, AMYLASE in the last 168 hours. No results for input(s): AMMONIA in the last 168 hours. CBC: Recent Labs  Lab 08/27/20 0410 08/27/20 0417 08/28/20 0245  WBC 9.8  --  8.5  NEUTROABS 5.4  --   --   HGB 10.4* 10.9* 10.5*  HCT 33.0* 32.0* 33.5*  MCV 85.7  --  85.0  PLT 256  --  265   Cardiac Enzymes: Recent Labs  Lab 08/27/20 1223  CKTOTAL 68   BNP: BNP (last 3 results) No results for input(s): BNP in the last 8760 hours.  ProBNP (last 3 results) No results for input(s): PROBNP in the last 8760 hours.  CBG: No results for input(s): GLUCAP in the last 168 hours.     Signed:  Annita Brod, MD [facility] 08/28/2020, 2:11 PM

## 2020-08-28 NOTE — Progress Notes (Signed)
Physical Therapy Treatment Patient Details Name: Ariel [name] MRN: [id number] DOB: [birth year] Today's Date: 08/28/2020    History of Present Illness Pt is a 75 y/o female admitted 6/24 secondary to increased L sided numbness and weakness. MRI revealed R frontal and parietal lobe infarcts and L occiptial lobe infarcts. PMH includes  hypertension, CVA, anxiety, depression, and cataracts.    PT Comments    The pt was seen for progression of OOB mobility and balance training in anticipation of d/c home. The pt reports continued improvement, was able to dramatically improve ambulation tolerance, but continues to be challenged by limited strength in LLE and poor dynamic stability when standing. The pt and her niece were educated on progressive mobility HEP for home, importance of continued activity, and fall risk reduction strategies for home. The pt is still safe to return home with HHPT once medially clear.     Follow Up Recommendations  Home health PT;Supervision for mobility/OOB     Equipment Recommendations  3in1 (PT)    Recommendations for Other Services       Precautions / Restrictions Precautions Precautions: Fall Restrictions Weight Bearing Restrictions: No    Mobility  Bed Mobility Overal bed mobility: Needs Assistance Bed Mobility: Supine to Sit;Sit to Supine     Supine to sit: Min guard Sit to supine: Supervision   General bed mobility comments: pt completed transition to sitting with use of bed rails, no assist to complete movements    Transfers Overall transfer level: Needs assistance Equipment used: 1 person hand held assist Transfers: Sit to/from Stand Sit to Stand: Min assist         General transfer comment: minA to steady, good power to rise from bed  Ambulation/Gait Ambulation/Gait assistance: Min assist Gait Distance (Feet): 75 Feet Assistive device: 1 person hand held assist Gait Pattern/deviations: Step-through pattern;Decreased step length -  right;Decreased stance time - left;Decreased weight shift to left;Shuffle Gait velocity: Decreased Gait velocity interpretation: <1.8 ft/sec, indicate of risk for recurrent falls General Gait Details: pt needing max cues to elongate stride length. Able to maintain for 3-5 steps prior to needing additional cues. minA to steady     Modified Rankin (Stroke Patients Only) Modified Rankin (Stroke Patients Only) Pre-Morbid Rankin Score: Slight disability Modified Rankin: Moderately severe disability     Balance Overall balance assessment: Needs assistance Sitting-balance support: No upper extremity supported;Feet supported Sitting balance-Leahy Scale: Good     Standing balance support: Bilateral upper extremity supported;During functional activity Standing balance-Leahy Scale: Fair Standing balance comment: reliant on UE support                            Cognition Arousal/Alertness: Awake/alert Behavior During Therapy: WFL for tasks assessed/performed Overall Cognitive Status: Impaired/Different from baseline Area of Impairment: Problem solving;Safety/judgement                         Safety/Judgement: Decreased awareness of deficits;Decreased awareness of safety   Problem Solving: Slow processing General Comments: cued for safety, mobility at home with intermittent repetition of cues/commands      Exercises Other Exercises Other Exercises: walking marches x10 each leg, minA of 2    General Comments General comments (skin integrity, edema, etc.): VSS, niece present and very encouraging      Pertinent Vitals/Pain Pain Assessment: No/denies pain Pain Intervention(s): Monitored during session     PT Goals (current goals can now be found  in the care plan section) Acute Rehab PT Goals Patient Stated Goal: to go home PT Goal Formulation: With patient/family Time For Goal Achievement: 09/10/20 Potential to Achieve Goals: Good Progress towards PT  goals: Progressing toward goals    Frequency    Min 4X/week      PT Plan Current plan remains appropriate       AM-PAC PT "6 Clicks" Mobility   Outcome Measure  Help needed turning from your back to your side while in a flat bed without using bedrails?: A Little Help needed moving from lying on your back to sitting on the side of a flat bed without using bedrails?: A Little Help needed moving to and from a bed to a chair (including a wheelchair)?: A Little Help needed standing up from a chair using your arms (e.g., wheelchair or bedside chair)?: A Little Help needed to walk in hospital room?: A Little Help needed climbing 3-5 steps with a railing? : A Little 6 Click Score: 18    End of Session Equipment Utilized During Treatment: Gait belt Activity Tolerance: Patient tolerated treatment well Patient left: in bed;with call bell/phone within reach;with family/visitor present Nurse Communication: Mobility status PT Visit Diagnosis: Unsteadiness on feet (R26.81);Muscle weakness (generalized) (M62.81)     Time: 4314-2767 PT Time Calculation (min) (ACUTE ONLY): 21 min  Charges:  $Gait Training: 8-22 mins                     Karma Ganja, PT, DPT   Acute Rehabilitation Department Pager #: [phone]   Otho Bellows 08/28/2020, 3:59 PM

## 2020-08-28 NOTE — Progress Notes (Signed)
SLP Cancellation Note  Patient Details Name: Ariel [name] MRN: [id number] DOB: 09/21/1945   Cancelled treatment:       Reason Eval/Treat Not Completed: SLP screened - note that pt has already passed a Yale swallow screen and has been initiated on a diet of regular solids and thin liquids. SLP will therefore hold swallowing evaluation at this time.   Please reorder if there are any acute changes or concern for difficulty on current diet. Also recommend that MD order SLP cognitive-linguistic evaluation given infarcts noted on MRI.     Osie Bond., M.A. [place] [facility] Pager [phone] Office [phone]  08/28/2020, 7:17 AM

## 2020-08-28 NOTE — TOC Initial Note (Addendum)
Transition of Care [facility]) - Initial/Assessment Note    Patient Details  Name: Ariel [name] MRN: [id number] Date of Birth: 02-10-1946  Transition of [facility]) CM/SW Contact:    Carles Collet, RN Phone Number: 08/28/2020, 11:53 AM  Clinical Narrative:           Damaris Schooner w patient at bedside to discuss DC plan. She states that she lives at home w her niece at: [address] [place], [place], [address] She has a RW at home, would like a 3/1. Ordered and requested to be dleivered to the room through [employer]. [place] referral made to  [facility] - Accepted           Expected Discharge Plan: [place] Barriers to Discharge: Continued Medical Work up   Patient Goals and CMS Choice Patient states their goals for this hospitalization and ongoing recovery are:: to go home CMS Medicare.gov Compare Post Acute Care list provided to:: Patient Choice offered to / list presented to : Patient  Expected Discharge Plan and Services Expected Discharge Plan: [place]   Discharge Planning Services: CM Consult Post Acute Care Choice: Durable Medical Equipment, Home Health Living arrangements for the past 2 months: Single Family Home                 DME Arranged: 3-N-1 DME Agency:  Celesta Aver) Date DME Agency Contacted: 08/28/20 Time DME Agency Contacted: 62 Representative spoke with at DME Agency: Brenton Grills HH Arranged: PT, OT [place] Agency: [facility] [place] Date [place]: 08/28/20 Time [place]: 90 Representative spoke with at [place]: Lattie Haw (pending)  Prior Living Arrangements/Services Living arrangements for the past 2 months: Single Family Home Lives with:: Relatives (Niece)   Do you feel safe going back to the place where you live?: Yes          Current home services: DME    Activities of Daily Living Home Assistive Devices/Equipment: Gilford Rile (specify type) ADL Screening (condition at time of admission) Patient's cognitive  ability adequate to safely complete daily activities?: Yes Is the patient deaf or have difficulty hearing?: No Does the patient have difficulty seeing, even when wearing glasses/contacts?: No Does the patient have difficulty concentrating, remembering, or making decisions?: No Patient able to express need for assistance with ADLs?: Yes Does the patient have difficulty dressing or bathing?: Yes Independently performs ADLs?: Yes (appropriate for developmental age) Does the patient have difficulty walking or climbing stairs?: Yes Weakness of Legs: Both Weakness of Arms/Hands: Left  Permission Sought/Granted                  Emotional Assessment              Admission diagnosis:  CVA (cerebral vascular accident) ([place]) [I63.9] Acute left-sided weakness [R53.1] Left-sided weakness [R53.1] Patient Active Problem List   Diagnosis Date Noted   Acute left-sided weakness 08/27/2020   CVA (cerebral vascular accident) ([place]) 08/27/2020   AKI (acute kidney injury) ([place]) 08/27/2020   Benign essential HTN 08/27/2020   Anxiety and depression 08/27/2020   GERD (gastroesophageal reflux disease) 08/27/2020   PCP:  Patient, No Pcp Per (Inactive) Pharmacy:   [facility] [place], [place] [address] AT [facility] [place] [address] Phone: [phone] Fax: [phone]     Social Determinants of Health (SDOH) Interventions    Readmission Risk Interventions No flowsheet data found.

## 2020-08-28 NOTE — Care Management CC44 (Signed)
Condition Code 44 Documentation Completed  Patient Details  Name: Ariel [name] MRN: [id number] Date of Birth: 1945-04-26   Condition Code 44 given:  Yes Patient signature on Condition Code 44 notice:  Yes Documentation of 2 MD's agreement:  Yes Code 44 added to claim:  Yes    Carles Collet, RN 08/28/2020, 2:35 PM

## 2020-08-30 ENCOUNTER — Encounter: Payer: Self-pay | Admitting: *Deleted

## 2020-08-30 NOTE — Progress Notes (Signed)
Patient ID: Ariel [name], female   DOB: 1945-07-21, 75 y.o.   MRN: [id number] Patient enrolled for [employer] to ship a 30 day Cardiac event monitor to [address], [place], [place]  [address]. Letter with instructions mailed to same address.

## 2020-09-04 ENCOUNTER — Encounter: Payer: Self-pay | Admitting: Neurology

## 2020-09-04 ENCOUNTER — Ambulatory Visit (INDEPENDENT_AMBULATORY_CARE_PROVIDER_SITE_OTHER): Payer: Medicare HMO

## 2020-09-04 DIAGNOSIS — I4891 Unspecified atrial fibrillation: Secondary | ICD-10-CM | POA: Diagnosis not present

## 2020-09-04 DIAGNOSIS — I6340 Cerebral infarction due to embolism of unspecified cerebral artery: Secondary | ICD-10-CM | POA: Diagnosis not present

## 2020-09-09 ENCOUNTER — Telehealth: Payer: Self-pay | Admitting: Cardiology

## 2020-09-09 NOTE — Telephone Encounter (Signed)
Received a page from preventive monitor company regarding alarm trigger. Appears monitor placed in the setting of CVA. Per monitor company they received an auto-trigger at 3:59am for possible syncope. Rhythm noted was sinus rhythm rates in the 60s. They attempted to contact the patient without success. I have called number for patient listed in the chart twice without success and VM left. Also called number listed for patient's niece and left VM this morning. Will re-attempt call to patient and family contact listed later this morning. Also forward to the office for staff to follow up with as well.

## 2020-09-09 NOTE — Telephone Encounter (Signed)
Received faxed copy of critical report day 6 of [employer] monitor wearing for CVA.  It is pt reported symptom of "passed out"  Pt activity "unavailable".  7//22 at 4:42 am CST Sinus Rhythm   Did not reach pt. Left messages on patient's and niece Sharon's VM to call back.

## 2020-09-09 NOTE — Telephone Encounter (Signed)
Patient's niece returned call.  Pt is background on call as well.  Pt lives w her niece.  She did not intentionally record passing out.  She gets up during the night to go to the bathroom and could have unintentionally pressed that button.   She did not pass out.

## 2020-09-24 ENCOUNTER — Other Ambulatory Visit: Payer: Self-pay | Admitting: Physician Assistant

## 2020-09-24 DIAGNOSIS — I6340 Cerebral infarction due to embolism of unspecified cerebral artery: Secondary | ICD-10-CM

## 2020-09-24 DIAGNOSIS — I4891 Unspecified atrial fibrillation: Secondary | ICD-10-CM

## 2020-10-07 ENCOUNTER — Encounter: Payer: Self-pay | Admitting: Adult Health

## 2020-10-07 ENCOUNTER — Ambulatory Visit: Payer: Medicare HMO | Admitting: Adult Health

## 2020-10-07 VITALS — BP 120/69 | HR 71 | Ht 66.0 in | Wt 178.0 lb

## 2020-10-07 DIAGNOSIS — E785 Hyperlipidemia, unspecified: Secondary | ICD-10-CM | POA: Diagnosis not present

## 2020-10-07 DIAGNOSIS — I10 Essential (primary) hypertension: Secondary | ICD-10-CM

## 2020-10-07 DIAGNOSIS — I69354 Hemiplegia and hemiparesis following cerebral infarction affecting left non-dominant side: Secondary | ICD-10-CM | POA: Diagnosis not present

## 2020-10-07 DIAGNOSIS — I639 Cerebral infarction, unspecified: Secondary | ICD-10-CM | POA: Diagnosis not present

## 2020-10-07 DIAGNOSIS — R7303 Prediabetes: Secondary | ICD-10-CM

## 2020-10-07 NOTE — Progress Notes (Signed)
I agree with the above plan 

## 2020-10-07 NOTE — Patient Instructions (Signed)
Continue working with home health therapies for likely ongoing recovery -if you would like to participate in additional therapy outpatient, please let either myself or your primary care provider know so we can place orders  You will be called by cardiology to further discuss placement of a loop recorder which will further evaluate for possible atrial fibrillation  Continue aspirin 81 mg daily  and atorvastatin  for secondary stroke prevention  Continue to follow up with PCP regarding cholesterol and blood pressure management  Maintain strict control of hypertension with blood pressure goal below 130/90 and cholesterol with LDL cholesterol (bad cholesterol) goal below 70 mg/dL.       Followup in the future with me in 3 months or call earlier if needed       Thank you for coming to see [place] at [facility]. I hope we have been able to provide you high quality care today.  You may receive a patient satisfaction survey over the next few weeks. We would appreciate your feedback and comments so that we may continue to improve ourselves and the health of our patients.

## 2020-10-07 NOTE — Progress Notes (Signed)
[facility] [address] [place] [address] [phone]       HOSPITAL FOLLOW UP NOTE  Ms. ZALEYAH ROOKSTOOL Date of Birth:  1945/05/28 Medical Record Number:  [id number]   Reason for Referral:  hospital stroke follow up    SUBJECTIVE:   CHIEF COMPLAINT:  Chief Complaint  Patient presents with   Follow-up    Rm 3 with niece sharon  Pt is well, just having weakness on L side.     HPI:   Ms. SHANIQUA HUYSER is a 75 y.o. female with multiple previous strokes, CAD, former tobacco use, medication noncompliance, dm2 and debility who presented on 08/27/2020 with worsening left-sided weakness.  Personally reviewed hospitalization pertinent progress notes, lab work and imaging with summary provided.  Stroke work-up -multiple scattered small punctate infarcts bilaterally, etiology unclear but likely cardioembolic.  Recommended 30 cardiac event monitor outpatient - if neg, consider ILR.  EF 60%.  CTA head/neck severe generalized intracranial arthrosclerosis (mostly R PCA and bilat ACAs, LICA [id number]).  Remote right greater than left bifrontal infarcts on MRI.  LDL 144.  A1c 6.1.  Initiated DAPT for 3 weeks and aspirin as well as atorvastatin 40 mg daily.  Resumed home BP meds of amlodipine and Coreg.  Therapies recommended HH PT/OT.   Today, 10/07/2020, Ms. Masino is being seen for hospital follow-up accompanied by her niece, Ivin Booty.  Reports residual left-sided weakness and gait impairment.  She also reports chronic swallowing difficulties with larger pills which slightly worsened since her stroke.  Denies difficulty swallowing food or liquid.  PCP recently placed order for [facility] OT/SLP but not yet started.  She continues to work with [facility] PT. Ambulates with RW - using approx 1 month prior to her stroke.  Denies any recent falls.  Moved in with her niece around March -continues to reside with her niece who assists as needed.  Denies new stroke/TIA symptoms.  Compliant on aspirin and  atorvastatin -denies side effects.  Blood pressure today 120/69.  Routinely monitored at home - stable.  30-day cardiac monitor completed - neg for A. Fib.  No further concerns at this time.     ROS:   14 system review of systems performed and negative with exception of those listed in HPI  PMH:  Past Medical History:  Diagnosis Date   Hypertension    Stroke ([place])     PSH:  Past Surgical History:  Procedure Laterality Date   TUBAL LIGATION     Performed at the age of 76 years old    Social History:  Social History   Socioeconomic History   Marital status: Widowed    Spouse name: Not on file   Number of children: Not on file   Years of education: Not on file   Highest education level: Not on file  Occupational History   Not on file  Tobacco Use   Smoking status: Never   Smokeless tobacco: Never  Vaping Use   Vaping Use: Never used  Substance and Sexual Activity   Alcohol use: Never   Drug use: Never   Sexual activity: Not on file  Other Topics Concern   Not on file  Social History Narrative   Not on file   Social Determinants of Health   Financial Resource Strain: Not on file  Food Insecurity: Not on file  Transportation Needs: Not on file  Physical Activity: Not on file  Stress: Not on file  Social Connections: Not on file  Intimate Partner Violence: Not on file    Family History:  Family History  Problem Relation Age of Onset   COPD Mother    Heart disease Father    CVA Maternal Grandmother    Heart disease Paternal Grandmother    CVA Paternal Grandfather     Medications:   Current Outpatient Medications on File Prior to Visit  Medication Sig Dispense Refill   amLODipine (NORVASC) 10 MG tablet Take 10 mg by mouth at bedtime.     aspirin 325 MG tablet Take 1 tablet (325 mg total) by mouth daily. 30 tablet 1   atorvastatin (LIPITOR) 40 MG tablet Take 1 tablet (40 mg total) by mouth daily. 30 tablet 1   buPROPion (WELLBUTRIN XL) 300 MG 24 hr  tablet Take 300 mg by mouth at bedtime.     carvedilol (COREG) 12.5 MG tablet Take 12.5 mg by mouth 2 (two) times daily with a meal.     fexofenadine (ALLEGRA) 180 MG tablet Take 180 mg by mouth daily as needed for allergies.     fluticasone (FLONASE) 50 MCG/ACT nasal spray Place 1 spray into both nostrils daily as needed for allergies.     gabapentin (NEURONTIN) 100 MG capsule Take 100 mg by mouth 3 (three) times daily as needed (pain).     sertraline (ZOLOFT) 100 MG tablet Take 100 mg by mouth at bedtime.     No current facility-administered medications on file prior to visit.    Allergies:   Allergies  Allergen Reactions   Lisinopril     Other reaction(s): Cough   Perflutren Lipid Microspheres     Other reaction(s): Abdominal Pain, Muscle Pain      OBJECTIVE:  Physical Exam  Vitals:   10/07/20 1113  BP: 120/69  Pulse: 71  Weight: 178 lb (80.7 kg)  Height: '5\' 6"'$  (1.676 m)   Body mass index is 28.73 kg/m. No results found.  Post stroke PHQ 2/9 Depression screen PHQ 2/9 10/07/2020  Decreased Interest 0  Down, Depressed, Hopeless 0  PHQ - 2 Score 0     General: well developed, well nourished, pleasant elderly Caucasian female, seated, in no evident distress Head: head normocephalic and atraumatic.   Neck: supple with no carotid or supraclavicular bruits Cardiovascular: regular rate and rhythm, no murmurs Musculoskeletal: no deformity Skin:  no rash/petichiae Vascular:  Normal pulses all extremities   Neurologic Exam Mental Status: Awake and fully alert.  Fluent speech and language.  Oriented to place and time. Recent and remote memory intact. Attention span, concentration and fund of knowledge appropriate. Mood and affect appropriate.  Cranial Nerves: Fundoscopic exam reveals sharp disc margins. Pupils equal, briskly reactive to light. Extraocular movements full without nystagmus. Visual fields full to confrontation. Hearing intact. Facial sensation intact.  Mild  right facial weakness.  Tongue and palate moves normally and symmetrically.  Motor: Normal bulk and tone. Normal strength in all tested extremity muscles except slight decrease left hand fine motor dexterity and very slight LLE weakness Sensory.: intact to touch , pinprick , position and vibratory sensation.  Coordination: Rapid alternating movements normal in all extremities except decreased left hand. Finger-to-nose and heel-to-shin performed accurately bilaterally.  Orbits right arm over left arm. Gait and Station: Arises from chair without difficulty. Stance is hunched.  Broad-based gait with slow cautious steps and decreased step height with use of rolling walker.  Tandem walk and heel toe not attempted. Reflexes: 1+ and symmetric. Toes downgoing.     NIHSS  1 Modified Rankin  3      ASSESSMENT: BREYONA LIENHART is a 75 y.o. year old female with recent multiple scattered small punctate infarcts bilaterally [id number], embolic secondary to unknown etiology.  Vascular risk factors include HTN, HLD, severe intracranial atherosclerosis, carotid stenosis, hx of prior strokes (per imaging, remote R>L bifrontal infarcts), CAD, advanced age and former tobacco use.      PLAN:  Recurrent strokes:  Residual deficit: Mild left hemiparesis and gait impairment.  Continue working with home health therapies and potentially transition to outpatient therapies once completed indicated. Chronic difficulty swallowing pills slightly worsened post stroke - plans to start working with SLP -may consider MBS if indicated in the future.  No difficulty swallowing food or liquid Cardiac monitor negative for A. Fib -referral placed to cardiac electrophysiology for placement of loop recorder Continue aspirin 81 mg daily  and atorvastatin 40 mg daily for secondary stroke prevention.   Discussed secondary stroke prevention measures and importance of close PCP follow up for aggressive stroke risk factor management. I  have gone over the pathophysiology of stroke, warning signs and symptoms, risk factors and their management in some detail with instructions to go to the closest emergency room for symptoms of concern. HTN: BP goal <130/90.  Stable on Norvasc and Coreg per PCP HLD: LDL goal <70. Recent LDL 144 - currently on fenofibrate per PCP.  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 Recent A1c 6.1.  Continue to be monitored by PCP.     Follow up in 3 months or call earlier if needed   CC:  [facility] provider: Dr. Leonie Man PCP: Willeen Niece, PA    I spent 46 minutes of face-to-face and non-face-to-face time with patient and niece.  This included previsit chart review including review of recent hospitalization, lab review, study review, order entry, electronic health record documentation, patient and niece education regarding recent stroke and potential etiology with further evaluation with repeat order, secondary stroke prevention measures and importance of managing stroke risk factors, residual deficits and typical recovery time and answered all other questions to patient and nieces satisfaction  Frann Rider, AGNP-BC  [facility] [address] [place], [place] [address]  Phone [phone] Fax [phone] Note: This document was prepared with digital dictation and possible smart phrase technology. Any transcriptional errors that result from this process are unintentional.

## 2020-11-23 ENCOUNTER — Other Ambulatory Visit: Payer: Self-pay

## 2020-11-23 ENCOUNTER — Encounter: Payer: Self-pay | Admitting: Cardiology

## 2020-11-23 ENCOUNTER — Ambulatory Visit: Payer: Medicare HMO | Admitting: Cardiology

## 2020-11-23 VITALS — BP 112/64 | HR 62 | Ht 66.0 in | Wt 180.0 lb

## 2020-11-23 DIAGNOSIS — I639 Cerebral infarction, unspecified: Secondary | ICD-10-CM | POA: Diagnosis not present

## 2020-11-23 NOTE — Patient Instructions (Signed)
Medication Instructions:  Your physician recommends that you continue on your current medications as directed. Please refer to the Current Medication list given to you today.  *If you need a refill on your cardiac medications before your next appointment, please call your pharmacy*   Lab Work: None ordered   Testing/Procedures: None ordered   Follow-Up: At [facility], you and your health needs are our priority.  As part of our continuing mission to provide you with exceptional heart care, we have created designated Provider Care Teams.  These Care Teams include your primary Cardiologist (physician) and Advanced Practice Providers (APPs -  Physician Assistants and Nurse Practitioners) who all work together to provide you with the care you need, when you need it.  We recommend signing up for the patient portal called "MyChart".  Sign up information is provided on this After Visit Summary.  MyChart is used to connect with patients for Virtual Visits (Telemedicine).  Patients are able to view lab/test results, encounter notes, upcoming appointments, etc.  Non-urgent messages can be sent to your provider as well.   To learn more about what you can do with MyChart, go to [email or web address].    Your next appointment:   As needed   The format for your next appointment:   In Person  Provider:   Allegra Lai, MD    Thank you for choosing [facility]!!   Trinidad Curet, RN [phone]   Other Instructions  Implantable Loop Recorder Placement An implantable loop recorder is a small electronic device that is placed under the skin of your chest. The device records the electrical activity of your heart over a long period of time. Your health care provider can download these recordings to monitor your heart. You may need an implantable loop recorder if you have periods of abnormal heart activity (arrhythmias) or unexplained fainting (syncope). The recorder can be left in place  for 1 year or longer. Tell a health care provider about: Any allergies you have. All medicines you are taking, including vitamins, herbs, eye drops, creams, and over-the-counter medicines. Any problems you or family members have had with anesthetic medicines. Any blood disorders you have. Any surgeries you have had. Any medical conditions you have. Whether you are pregnant or may be pregnant. What are the risks? Generally, this is a safe procedure. However, problems may occur, including: Infection. Bleeding. Allergic reactions to anesthetic medicines. Damage to nerves or blood vessels. Failure of the device to work. This could require another surgery to replace it. What happens before the procedure?  You may have a physical exam, blood tests, and imaging tests of your heart, such as a chest X-ray. Follow instructions from your health care provider about eating or drinking restrictions. Ask your health care provider about: Changing or stopping your regular medicines. This is especially important if you are taking diabetes medicines or blood thinners. Taking medicines such as aspirin and ibuprofen. These medicines can thin your blood. Do not take these medicines unless your health care provider tells you to take them. Taking over-the-counter medicines, vitamins, herbs, and supplements. Ask your health care provider how your surgical site will be marked or identified. Ask your health care provider what steps will be taken to help prevent infection. These may include: Removing hair at the surgery site. Washing skin with a germ-killing soap. Plan to have someone take you home from the hospital or clinic. Plan to have a responsible adult care for you for at least 24 hours after you  leave the hospital or clinic. This is important. Do not use any products that contain nicotine or tobacco, such as cigarettes and e-cigarettes. If you need help quitting, ask your health care provider. What  happens during the procedure? An IV will be inserted into one of your veins. You may be given one or more of the following: A medicine to help you relax (sedative). A medicine to numb the area (local anesthetic). A small incision will be made on the left side of your upper chest. A pocket will be created under your skin. The device will be placed in the pocket. The incision will be closed with stitches (sutures) or adhesive strips. A bandage (dressing) will be placed over the incision. The procedure may vary among health care providers and hospitals. What happens after the procedure? Your blood pressure, heart rate, breathing rate, and blood oxygen level will be monitored until you leave the hospital or clinic. You may be able to go home on the day of your surgery. Before you go home: Your health care provider will program your recorder. You will learn how to trigger your device with a handheld activator. You will learn how to send recordings to your health care provider. You will get an ID card for your device, and you will be told when to use it. Do not drive for 24 hours if you were given a sedative during your procedure. Summary An implantable loop recorder is a small electronic device that is placed under the skin of your chest to monitor your heart over a long period of time. The recorder can be left in place for 1 year or longer. Plan to have someone take you home from the hospital or clinic. This information is not intended to replace advice given to you by your health care provider. Make sure you discuss any questions you have with your health care provider. Document Revised: 11/11/2019 Document Reviewed: 04/07/2017 Elsevier Patient Education  2022 [place] Placement, Care After This sheet gives you information about how to care for yourself after your procedure. Your health care provider may also give you more specific instructions. If  you have problems or questions, contact your health care provider. What can I expect after the procedure? After the procedure, it is common to have: Soreness or discomfort near the incision. Some swelling or bruising near the incision. Follow these instructions at home: Incision care  Follow instructions from your health care provider about how to take care of your incision. Make sure you: Wash your hands with soap and water before you change your bandage (dressing). If soap and water are not available, use hand sanitizer. Change your dressing as told by your health care provider. Keep your dressing dry. Leave stitches (sutures), skin glue, or adhesive strips in place. These skin closures may need to stay in place for 2 weeks or longer. If adhesive strip edges start to loosen and curl up, you may trim the loose edges. Do not remove adhesive strips completely unless your health care provider tells you to do that. Check your incision area every day for signs of infection. Check for: Redness, swelling, or pain. Fluid or blood. Warmth. Pus or a bad smell. Do not take baths, swim, or use a hot tub until your health care provider approves. Ask your health care provider if you can take showers. Activity  Return to your normal activities as told by your health care provider. Ask your health  care provider what activities are safe for you. Do not drive for 24 hours if you were given a sedative during your procedure. General instructions Follow instructions from your health care provider about how to manage your implantable loop recorder and transmit the information. Learn how to activate a recording if this is necessary for your type of device. Do not go through a metal detection gate, and do not let someone hold a metal detector over your chest. Show your ID card. Do not have an MRI unless you check with your health care provider first. Take over-the-counter and prescription medicines only as told  by your health care provider. Keep all follow-up visits as told by your health care provider. This is important. Contact a health care provider if: You have redness, swelling, or pain around your incision. You have a fever. You have pain that is not relieved by your pain medicine. You have triggered your device because of fainting (syncope) or because of a heartbeat that feels like it is racing, slow, fluttering, or skipping (palpitations). Get help right away if you have: Chest pain. Difficulty breathing. Summary After the procedure, it is common to have soreness or discomfort near the incision. Change your dressing as told by your health care provider. Follow instructions from your health care provider about how to manage your implantable loop recorder and transmit the information. Keep all follow-up visits as told by your health care provider. This is important. This information is not intended to replace advice given to you by your health care provider. Make sure you discuss any questions you have with your health care provider. Document Revised: 06/22/2020 Document Reviewed: 06/22/2020 Elsevier Patient Education  [place].

## 2020-11-23 NOTE — Progress Notes (Signed)
Electrophysiology Office Note   Date:  11/23/2020   ID:  FRANKIE ZITO, DOB Jul 19, 1945, MRN [id number]  PCP:  Willeen Niece, PA  Cardiologist:   Primary Electrophysiologist:  Kamdyn Colborn Meredith Leeds, MD    Chief Complaint: CVA   History of Present Illness: Ariel [name] is a 75 y.o. female who is being seen today for the evaluation of CVA at the request of Frann Rider, NP. Presenting today for electrophysiology evaluation.  She has a history of coronary artery disease, former tobacco abuse, type 2 diabetes, and CVA.  She has had multiple previous strokes.  She presented to hospital 08/27/2020 with worsening left-sided weakness.  Imaging showed multiple scattered small punctate infarcts bilaterally thought cardioembolic.  She wore a cardiac monitor that showed no evidence of atrial fibrillation.  CT of the neck ruled out intracranial stenosis.  Unfortunately, she has continued residual left-sided weakness with gait impairment.  She has chronic swallowing difficulties.  Today, she denies symptoms of palpitations, chest pain, shortness of breath, orthopnea, PND, lower extremity edema, claudication, dizziness, presyncope, syncope, bleeding, or neurologic sequela. The patient is tolerating medications without difficulties.  Today she feels well.  She has no chest pain or shortness of breath.  She is able to all of her daily activities without restriction.  She is unaware of episodes of atrial fibrillation.  She does not have palpitations.  At this point, there is no cause for her stroke as determined, she would like to have a Linq monitor implanted.   Past Medical History:  Diagnosis Date   Hypertension    Stroke [facility])    Past Surgical History:  Procedure Laterality Date   TUBAL LIGATION     Performed at the age of 75 years old     Current Outpatient Medications  Medication Sig Dispense Refill   amLODipine (NORVASC) 10 MG tablet Take 10 mg by mouth at bedtime.     aspirin 325 MG  tablet Take 1 tablet (325 mg total) by mouth daily. 30 tablet 1   atorvastatin (LIPITOR) 40 MG tablet Take 1 tablet (40 mg total) by mouth daily. 30 tablet 1   buPROPion (WELLBUTRIN XL) 300 MG 24 hr tablet Take 300 mg by mouth at bedtime.     carvedilol (COREG) 12.5 MG tablet Take 12.5 mg by mouth 2 (two) times daily with a meal.     fexofenadine (ALLEGRA) 180 MG tablet Take 180 mg by mouth daily as needed for allergies.     gabapentin (NEURONTIN) 100 MG capsule Take 100 mg by mouth 3 (three) times daily as needed (pain).     sertraline (ZOLOFT) 100 MG tablet Take 100 mg by mouth at bedtime.     fluticasone (FLONASE) 50 MCG/ACT nasal spray Place 1 spray into both nostrils daily as needed for allergies.     No current facility-administered medications for this visit.    Allergies:   Lisinopril and Perflutren lipid microspheres   Social History:  The patient  reports that she has never smoked. She has never used smokeless tobacco. She reports that she does not drink alcohol and does not use drugs.   Family History:  The patient's family history includes COPD in her mother; CVA in her maternal grandmother and paternal grandfather; Heart disease in her father and paternal grandmother.    ROS:  Please see the history of present illness.   Otherwise, review of systems is positive for none.   All other systems are reviewed and negative.  PHYSICAL EXAM: VS:  BP 112/64   Pulse 62   Ht 5\' 6"  (1.676 m)   Wt 180 lb (81.6 kg)   SpO2 94%   BMI 29.05 kg/m  , BMI Body mass index is 29.05 kg/m. GEN: Well nourished, well developed, in no acute distress  HEENT: normal  Neck: no JVD, carotid bruits, or masses Cardiac: RRR; no murmurs, rubs, or gallops,no edema  Respiratory:  clear to auscultation bilaterally, normal work of breathing GI: soft, nontender, nondistended, + BS MS: no deformity or atrophy  Skin: warm and dry Neuro:  Strength and sensation are intact Psych: euthymic mood, full  affect  EKG:  EKG is ordered today. Personal review of the ekg ordered shows sinus rhythm, rate 63  Recent Labs: 08/27/2020: ALT 25 08/28/2020: BUN 19; Creatinine, Ser 1.30; Hemoglobin 10.5; Platelets 265; Potassium 4.0; Sodium 138    Lipid Panel     Component Value Date/Time   CHOL 226 (H) 08/27/2020 1223   TRIG 149 08/27/2020 1223   HDL 52 08/27/2020 1223   CHOLHDL 4.3 08/27/2020 1223   VLDL 30 08/27/2020 1223   LDLCALC 144 (H) 08/27/2020 1223     Wt Readings from Last 3 Encounters:  11/23/20 180 lb (81.6 kg)  10/07/20 178 lb (80.7 kg)  08/27/20 150 lb (68 kg)      Other studies Reviewed: Additional studies/ records that were reviewed today include: TTE 08/27/20  Review of the above records today demonstrates:   1. Left ventricular ejection fraction, by estimation, is 60 to 65%. The  left ventricle has normal function. The left ventricle has no regional  wall motion abnormalities. There is mild left ventricular hypertrophy.  Left ventricular diastolic parameters  are consistent with Grade I diastolic dysfunction (impaired relaxation).   2. Right ventricular systolic function is normal. The right ventricular  size is normal. There is normal pulmonary artery systolic pressure.   3. The mitral valve is normal in structure. Mild mitral valve  regurgitation. No evidence of mitral stenosis.   4. The aortic valve is tricuspid. Aortic valve regurgitation is not  visualized. Mild aortic valve sclerosis is present, with no evidence of  aortic valve stenosis.   5. The inferior vena cava is normal in size with greater than 50%  respiratory variability, suggesting right atrial pressure of 3 mmHg.   6. Agitated saline contrast bubble study was negative, with no evidence  of any interatrial shunt.   Cardiac monitor 09/24/2020 personally reviewed Sinus rhythm   Rates 50s to 110s    Average HR in 70s   Rare PVC Episode of syncope associated with SR 70s    ASSESSMENT AND PLAN:  1.   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  She has had multiple strokes, most recently with a cardioembolic pattern.  She has had vascular imaging that showed no obvious stenosis.  She has also had an echo and cardiac monitor without evidence of source.  Due to that, she would benefit from Linq monitor implant.  Risks and benefits of been discussed risk of bleeding and infection.  She understands these risks and has agreed to the procedure.    Current medicines are reviewed at length with the patient today.   The patient does not have concerns regarding her medicines.  The following changes were made today:  none  Labs/ tests ordered today include:  Orders Placed This Encounter  Procedures   EKG 12-Lead     Disposition:   FU with  Sarra Rachels pending link monitor results  Signed, Shonn Farruggia Meredith Leeds, MD  11/23/2020 1:55 PM     [place] [address] [phone] (office) [phone] (fax)  SURGEON:  Allegra Lai, MD     PREPROCEDURE DIAGNOSIS:  Cryptogenic Stroke    POSTPROCEDURE DIAGNOSIS:  Cryptogenic Stroke     PROCEDURES:   1. Implantable loop recorder implantation    INTRODUCTION:  Ariel [name] is a 75 y.o. female with a history of unexplained stroke who presents today for implantable loop implantation.  The patient has had a cryptogenic stroke.  Despite an extensive workup by neurology, no reversible causes have been identified.  she has worn telemetry during which she did not have arrhythmias.  There is significant concern for possible atrial fibrillation as the cause for the patients stroke.  The patient therefore presents today for implantable loop implantation.     DESCRIPTION OF PROCEDURE:  Informed written consent was obtained, and the patient was brought to the electrophysiology lab in a fasting state.  The patient required no sedation for the procedure today.  Mapping over the patient's chest was  performed by the EP lab staff to identify the area where electrograms were most prominent for ILR recording.  This area was found to be the left parasternal region over the 3rd-4th intercostal space. The patients left chest was therefore prepped and draped in the usual sterile fashion by the EP lab staff. The skin overlying the left parasternal region was infiltrated with lidocaine for local analgesia.  A 0.5-cm incision was made over the left parasternal region over the 3rd intercostal space.  A subcutaneous ILR pocket was fashioned using a combination of sharp and blunt dissection.  A Medtronic Reveal Linq model [place] [id number] implantable loop recorder was then placed into the pocket  R waves were very prominent and measured 0.19mV. EBL<1 ml.  Steri- Strips and a sterile dressing were then applied.  There were no early apparent complications.     CONCLUSIONS:   1. Successful implantation of a Medtronic Reveal LINQ implantable loop recorder for cryptogenic stroke  2. No early apparent complications.

## 2020-12-01 ENCOUNTER — Telehealth: Payer: Self-pay

## 2020-12-01 NOTE — Telephone Encounter (Signed)
No answer/no voicemail. The patient needs to open her mobile app and hit the "got it" for the app to work.

## 2020-12-09 NOTE — Telephone Encounter (Signed)
I spoke with the patient niece Ivin Booty (dpr) and asked her to get into the mobile monitor for the patient and hit "got it". I told her to make sure it stay plug in by her bedside. I gave her the device clinic direct phone number in case she needs help.

## 2020-12-17 ENCOUNTER — Other Ambulatory Visit (HOSPITAL_COMMUNITY): Payer: Self-pay | Admitting: *Deleted

## 2020-12-17 DIAGNOSIS — R1310 Dysphagia, unspecified: Secondary | ICD-10-CM

## 2020-12-23 ENCOUNTER — Ambulatory Visit (HOSPITAL_COMMUNITY)
Admission: RE | Admit: 2020-12-23 | Discharge: 2020-12-23 | Disposition: A | Discharge status: Home / Self Care | Payer: Medicare HMO | Source: Ambulatory Visit | Attending: Family Medicine | Admitting: Family Medicine

## 2020-12-23 ENCOUNTER — Other Ambulatory Visit: Payer: Self-pay

## 2020-12-23 DIAGNOSIS — R1310 Dysphagia, unspecified: Secondary | ICD-10-CM | POA: Insufficient documentation

## 2020-12-23 DIAGNOSIS — I639 Cerebral infarction, unspecified: Secondary | ICD-10-CM | POA: Insufficient documentation

## 2020-12-23 IMAGING — RF DG SWALLOWING FUNCTION
12 of 16 series · 12 of 24 positions shown · non-contrast
Comparison: None.

CLINICAL DATA: Dysphagia.

EXAM:
MODIFIED BARIUM SWALLOW
TECHNIQUE: Different consistencies of barium were administered orally to the
patient by the Speech Pathologist. Imaging of the pharynx was
performed in the lateral projection. The radiologist was present in
the fluoroscopy room for this study, providing personal supervision.
FLUOROSCOPY TIME:  Fluoroscopy Time:  1 minute and 29 seconds
Radiation Exposure Index (if provided by the fluoroscopic device):
Not applicable.
Number of Acquired Spot Images: 0

[Series 1: run · 1 of 12 frames shown (1 of 12)]
[frame 11/12]
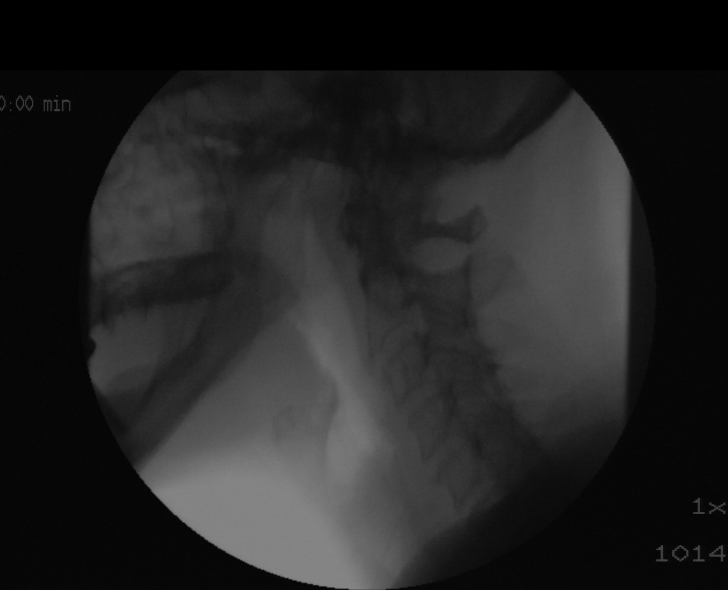

[Series 3: run · 1 of 64 frames shown (2 of 12)]
[frame 2/64]
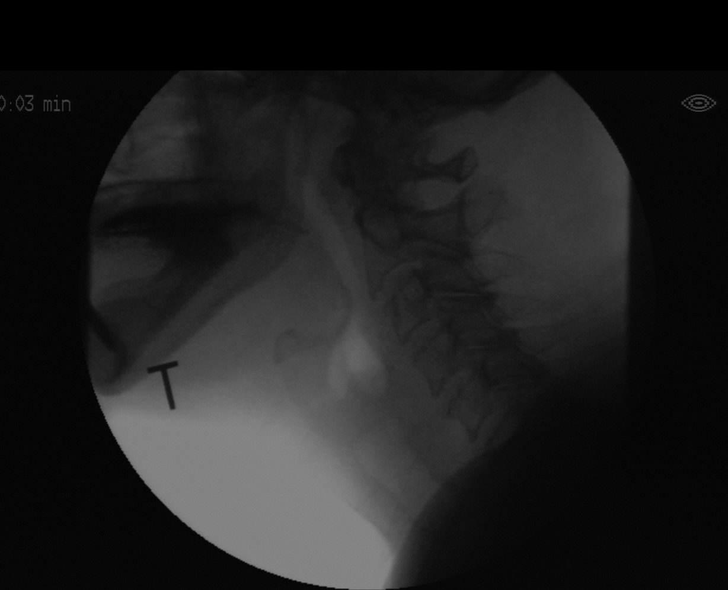

[Series 4: run · 1 of 147 frames shown (3 of 12)]
[frame 76/147]
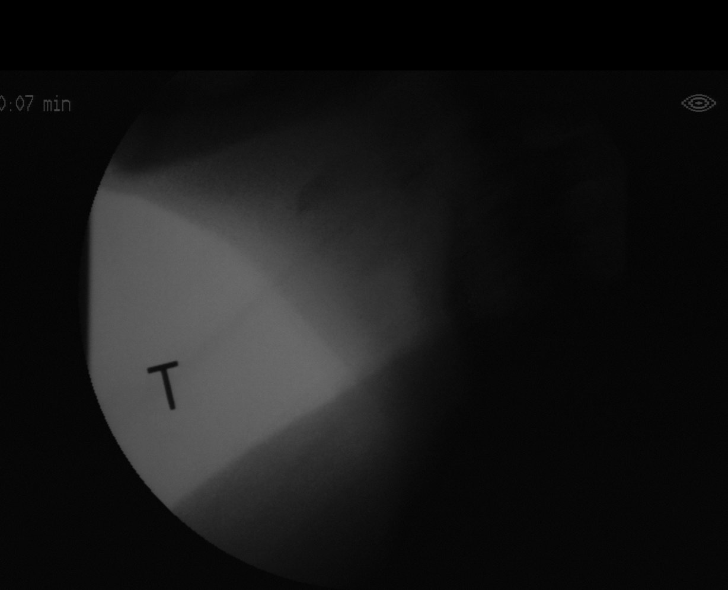

[Series 5: run · 1 of 147 frames shown (4 of 12)]
[frame 125/147]
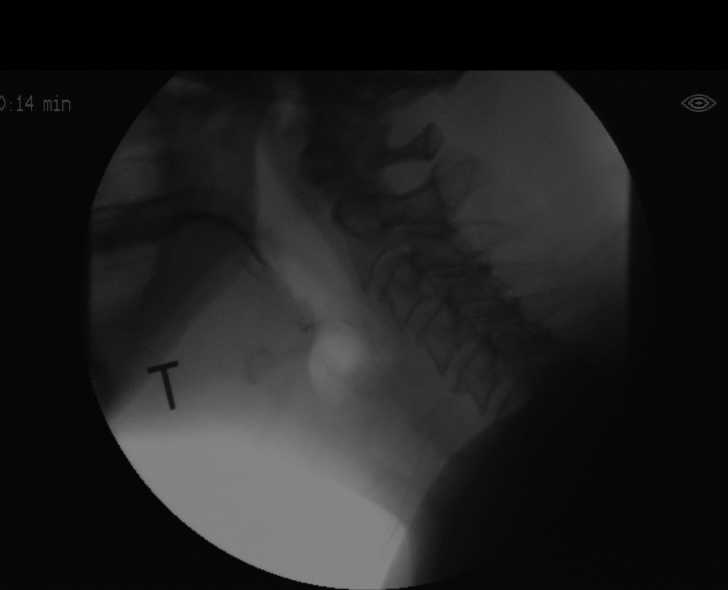

[Series 7: run · 1 of 250 frames shown (5 of 12)]
[frame 126/250]
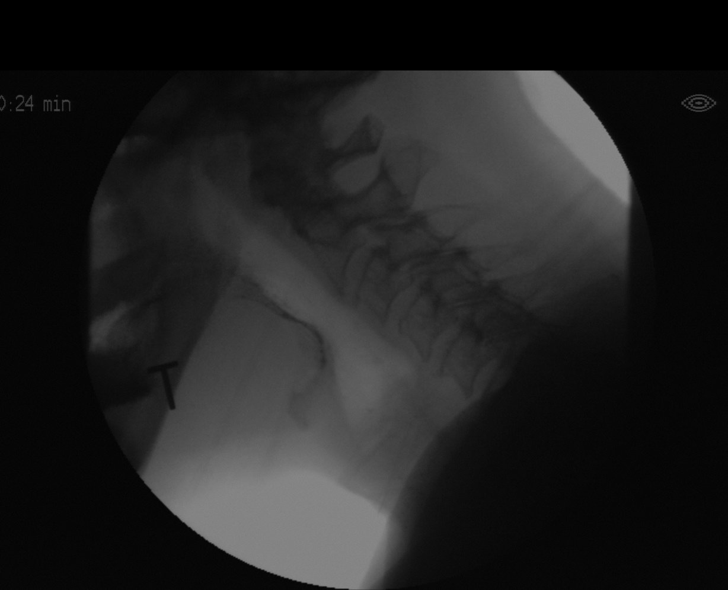

[Series 8: run · 1 of 132 frames shown (6 of 12)]
[frame 99/132]
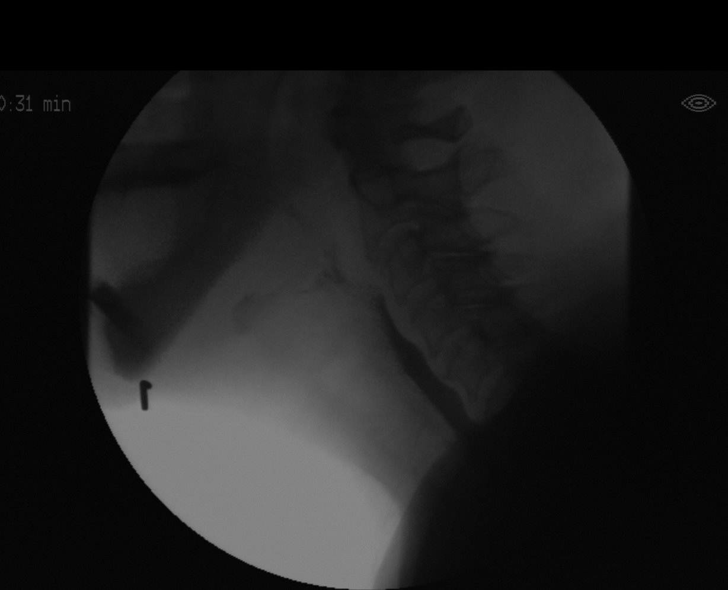

[Series 10: run · 1 of 209 frames shown (7 of 12)]
[frame 32/209]
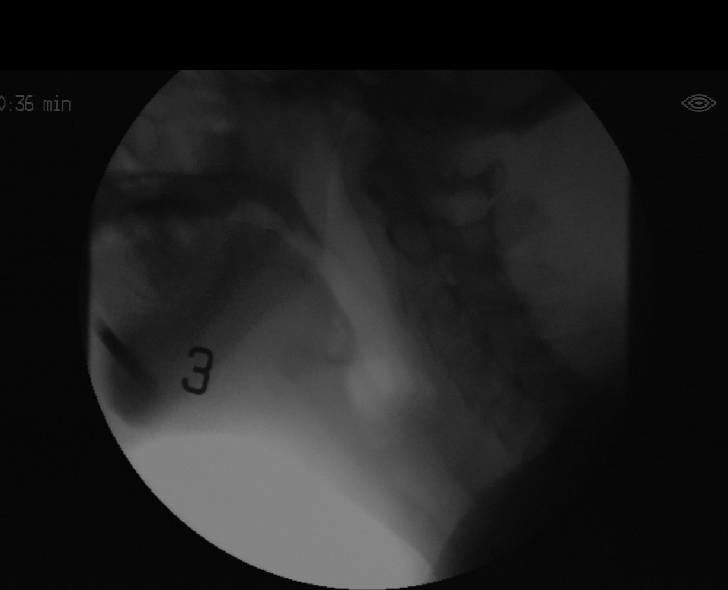

[Series 11: run · 1 of 585 frames shown (8 of 12)]
[frame 88/585]
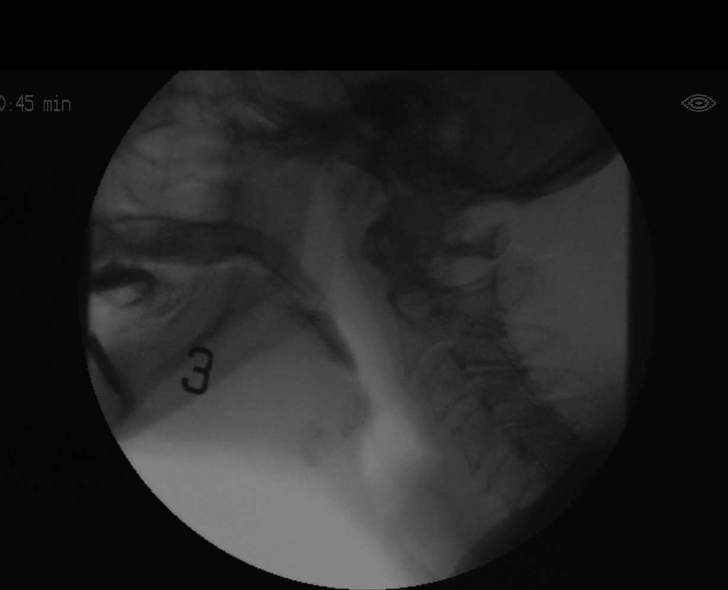

[Series 12: run · 1 of 224 frames shown (9 of 12)]
[frame 191/224]
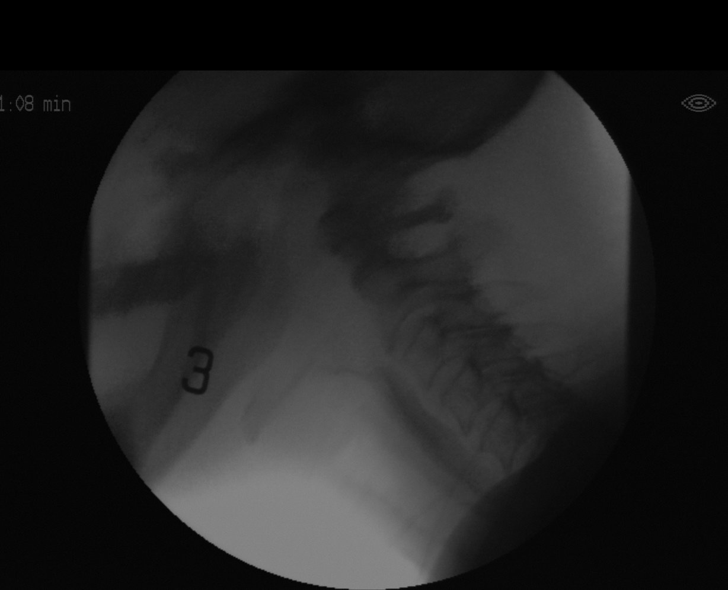

[Series 14: run · 1 of 200 frames shown (10 of 12)]
[frame 3/200]
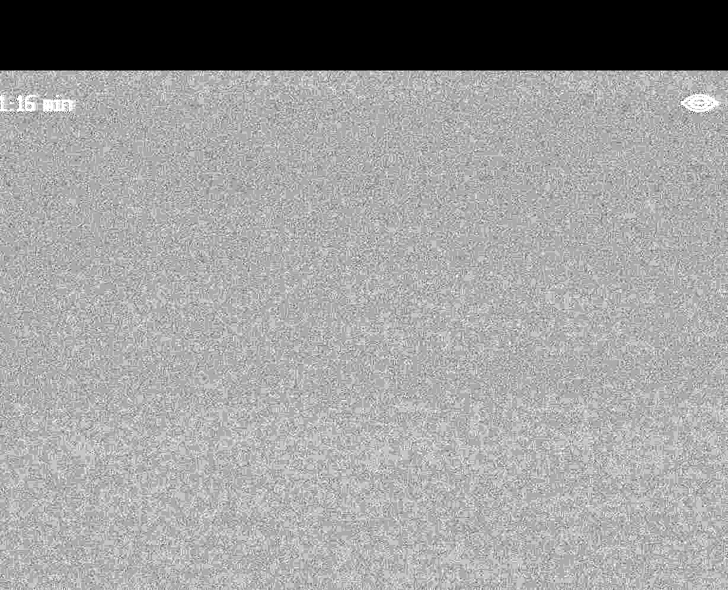

[Series 15: run · 1 of 127 frames shown (11 of 12)]
[frame 64/127]
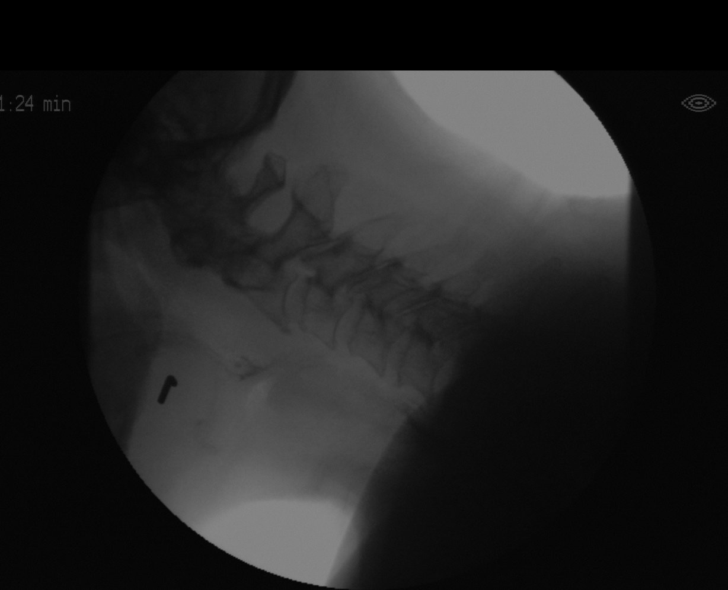

[Series 16: run · 1 of 96 frames shown (12 of 12)]
[frame 82/96]
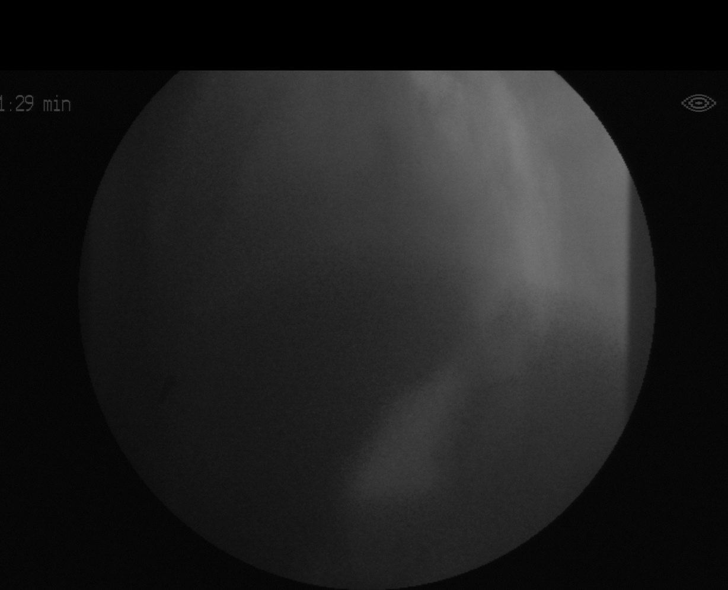

[12 of 24 positions shown; findings below may reference images not displayed]

FINDINGS: With thin liquids and puree thickness, no swallow dysfunction
identified.
IMPRESSION: No evidence of swallow dysfunction.

Please refer to the Speech Pathologists report for complete details
and recommendations.

## 2020-12-23 NOTE — Progress Notes (Signed)
Modified Barium Swallow Progress Note  Patient Details  Name: Ariel [name] MRN: [id number] Date of Birth: 12-15-1945  Today's Date: 12/23/2020  Modified Barium Swallow completed.  Full report located under Chart Review in the Imaging Section.  Brief recommendations include the following:  Clinical Impression  Pt has adequate swallowing function. No weakness or timing impairment. Pt tolerated all textures though needed extra time for mastication given missing dentition. She was unable to orally transit pill; consistently separated pill from liquid or puree bolus and held anteriorally and spit out. SLP attempted to cue pt with strategies to no effect. Recommend pt avoid large pills with assist of MD and pharmacist.   Swallow Evaluation Recommendations       SLP Diet Recommendations: Regular solids;Thin liquid   Liquid Administration via: Cup;Straw   Medication Administration: Whole meds with liquid (small whole pills, avoid large pills when possible)   Supervision: Patient able to self feed   Compensations: Slow rate;Small sips/bites   Postural Changes: Seated upright at 90 degrees            Sokha Craker, Katherene Ponto 12/23/2020,1:20 PM

## 2020-12-31 ENCOUNTER — Telehealth: Payer: Self-pay

## 2020-12-31 NOTE — Telephone Encounter (Signed)
I called the patient to help her with her mobile app. No answer/no voicemail. I called her niece Ivin Booty and it was a work number.

## 2021-01-04 ENCOUNTER — Telehealth: Payer: Self-pay

## 2021-01-04 NOTE — Telephone Encounter (Signed)
I spoke with Ivin Booty and she states she is going to take a look at the mobile monitor when she get home. I gave her the option of me ordering the patient a bedside monitor but she declined at this time.

## 2021-01-05 ENCOUNTER — Ambulatory Visit: Payer: Medicare HMO | Admitting: Adult Health

## 2021-01-05 ENCOUNTER — Encounter: Payer: Self-pay | Admitting: Adult Health

## 2021-01-05 ENCOUNTER — Telehealth: Payer: Self-pay

## 2021-01-05 ENCOUNTER — Ambulatory Visit: Payer: Medicare HMO | Attending: Physician Assistant | Admitting: Speech Pathology

## 2021-01-05 NOTE — Progress Notes (Deleted)
[facility] [address] [place]. [place] [address] [phone]       STROKE FOLLOW UP NOTE  Ariel [name] Date of Birth:  Jan 08, 1946 Medical Record Number:  [id number]   Reason for Referral: stroke follow up    SUBJECTIVE:   CHIEF COMPLAINT:  No chief complaint on file.   HPI:   Update 01/05/2021 JM: Returns for 3-month stroke follow-up  Overall stable from stroke standpoint without new stroke/TIA symptoms Reports residual *** Underwent MBS 10/20 which showed adequate swallowing function but noted difficulty swallowing pills - advised to avoid large pills  Compliant on aspirin and fenofibrate -denies side effects.  Difficulty swallowing atorvastatin 40 mg tablet Recent repeat lipid panel *** Blood pressure today ***  S/p ILR placement 9/20 by Dr. Curt Bears - no down available. Per epic review, working with cardiology to correct this issue - using mobile monitor. Bedside monitor offered but niece declined      History provided for reference purposes only Initial visit 10/07/2020 JM: Ariel [name] is being seen for hospital follow-up accompanied by her niece, Ivin Booty.  Reports residual left-sided weakness and gait impairment.  She also reports chronic swallowing difficulties with larger pills which slightly worsened since her stroke.  Denies difficulty swallowing food or liquid.  PCP recently placed order for [facility] OT/SLP but not yet started.  She continues to work with [facility] PT. Ambulates with RW - using approx 1 month prior to her stroke.  Denies any recent falls.  Moved in with her niece around March -continues to reside with her niece who assists as needed.  Denies new stroke/TIA symptoms.  Compliant on aspirin and atorvastatin -denies side effects.  Blood pressure today 120/69.  Routinely monitored at home - stable.  30-day cardiac monitor completed - neg for A. Fib.  No further concerns at this time.  Stroke admission 08/27/2020 Ariel [name] is a 75 y.o.  female with multiple previous strokes, CAD, former tobacco use, medication noncompliance, dm2 and debility who presented on 08/27/2020 with worsening left-sided weakness.  Personally reviewed hospitalization pertinent progress notes, lab work and imaging with summary provided.  Stroke work-up -multiple scattered small punctate infarcts bilaterally, etiology unclear but likely cardioembolic.  Recommended 30 cardiac event monitor outpatient - if neg, consider ILR.  EF 60%.  CTA head/neck severe generalized intracranial arthrosclerosis (mostly R PCA and bilat ACAs, LICA 10%).  Remote right greater than left bifrontal infarcts on MRI.  LDL 144.  A1c 6.1.  Initiated DAPT for 3 weeks and aspirin as well as atorvastatin 40 mg daily.  Resumed home BP meds of amlodipine and Coreg.  Therapies recommended HH PT/OT.      ROS:   14 system review of systems performed and negative with exception of those listed in HPI  PMH:  Past Medical History:  Diagnosis Date   Hypertension    Stroke ([place])     PSH:  Past Surgical History:  Procedure Laterality Date   TUBAL LIGATION     Performed at the age of 75 years old    Social History:  Social History   Socioeconomic History   Marital status: Widowed    Spouse name: Not on file   Number of children: Not on file   Years of education: Not on file   Highest education level: Not on file  Occupational History   Not on file  Tobacco Use   Smoking status: Never   Smokeless tobacco: Never  Vaping Use   Vaping  Use: Never used  Substance and Sexual Activity   Alcohol use: Never   Drug use: Never   Sexual activity: Not on file  Other Topics Concern   Not on file  Social History Narrative   Not on file   Social Determinants of Health   Financial Resource Strain: Not on file  Food Insecurity: Not on file  Transportation Needs: Not on file  Physical Activity: Not on file  Stress: Not on file  Social Connections: Not on file  Intimate Partner  Violence: Not on file    Family History:  Family History  Problem Relation Age of Onset   COPD Mother    Heart disease Father    CVA Maternal Grandmother    Heart disease Paternal Grandmother    CVA Paternal Grandfather     Medications:   Current Outpatient Medications on File Prior to Visit  Medication Sig Dispense Refill   amLODipine (NORVASC) 10 MG tablet Take 10 mg by mouth at bedtime.     aspirin 325 MG tablet Take 1 tablet (325 mg total) by mouth daily. 30 tablet 1   atorvastatin (LIPITOR) 40 MG tablet Take 1 tablet (40 mg total) by mouth daily. 30 tablet 1   buPROPion (WELLBUTRIN XL) 300 MG 24 hr tablet Take 300 mg by mouth at bedtime.     carvedilol (COREG) 12.5 MG tablet Take 12.5 mg by mouth 2 (two) times daily with a meal.     fexofenadine (ALLEGRA) 180 MG tablet Take 180 mg by mouth daily as needed for allergies.     fluticasone (FLONASE) 50 MCG/ACT nasal spray Place 1 spray into both nostrils daily as needed for allergies.     gabapentin (NEURONTIN) 100 MG capsule Take 100 mg by mouth 3 (three) times daily as needed (pain).     sertraline (ZOLOFT) 100 MG tablet Take 100 mg by mouth at bedtime.     No current facility-administered medications on file prior to visit.    Allergies:   Allergies  Allergen Reactions   Lisinopril     Other reaction(s): Cough   Perflutren Lipid Microspheres     Other reaction(s): Abdominal Pain, Muscle Pain      OBJECTIVE:  Physical Exam  There were no vitals filed for this visit.  There is no height or weight on file to calculate BMI. No results found.  General: well developed, well nourished, pleasant elderly Caucasian female, seated, in no evident distress Head: head normocephalic and atraumatic.   Neck: supple with no carotid or supraclavicular bruits Cardiovascular: regular rate and rhythm, no murmurs Musculoskeletal: no deformity Skin:  no rash/petichiae Vascular:  Normal pulses all extremities   Neurologic  Exam Mental Status: Awake and fully alert.  Fluent speech and language.  Oriented to place and time. Recent and remote memory intact. Attention span, concentration and fund of knowledge appropriate. Mood and affect appropriate.  Cranial Nerves: Pupils equal, briskly reactive to light. Extraocular movements full without nystagmus. Visual fields full to confrontation. Hearing intact. Facial sensation intact.  Mild right facial weakness.  Tongue and palate moves normally and symmetrically.  Motor: Normal bulk and tone. Normal strength in all tested extremity muscles except slight decrease left hand fine motor dexterity and very slight LLE weakness Sensory.: intact to touch , pinprick , position and vibratory sensation.  Coordination: Rapid alternating movements normal in all extremities except decreased left hand. Finger-to-nose and heel-to-shin performed accurately bilaterally.  Orbits right arm over left arm. Gait and Station: Arises from  chair without difficulty. Stance is hunched.  Broad-based gait with slow cautious steps and decreased step height with use of rolling walker.  Tandem walk and heel toe not attempted. Reflexes: 1+ and symmetric. Toes downgoing.          ASSESSMENT: KLOI BRODMAN is a 75 y.o. year old female with recent multiple scattered small punctate infarcts bilaterally 3/79/0240, embolic secondary to unknown etiology.  Vascular risk factors include HTN, HLD, severe intracranial atherosclerosis, carotid stenosis, hx of prior strokes (per imaging, remote R>L bifrontal infarcts), CAD, advanced age and former tobacco use.      PLAN:  Recurrent strokes:  Residual deficit: Mild left hemiparesis and gait impairment.  Continue working with home health therapies and potentially transition to outpatient therapies once completed indicated. MBS 12/23/2020: Adequate swallowing function although noted difficulty swallowing pills and advised to work with PCP/pharmacy to avoid large  pills ILR placed 11/23/2020 - no downloads available - working with cardiology to resolve issue Continue aspirin 81 mg daily  and fenofibrate for secondary stroke prevention.   Discussed secondary stroke prevention measures and importance of close PCP follow up for aggressive stroke risk factor management. I have gone over the pathophysiology of stroke, warning signs and symptoms, risk factors and their management in some detail with instructions to go to the closest emergency room for symptoms of concern. HTN: BP goal <130/90.  Stable on Norvasc and Coreg per PCP HLD: LDL goal <70.  Prior LDL 144 - currently on fenofibrate per PCP.  Difficulty swallowing atorvastatin. 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 prior A1c 6.1.  needs repeat A1c ***    Follow up in 3 months or call earlier if needed   CC:  [facility] provider: Dr. Leonie Man PCP: Willeen Niece, PA    I spent 46 minutes of face-to-face and non-face-to-face time with patient and niece.  This included previsit chart review, lab review, study review, order entry, electronic health record documentation, patient and niece education regarding prior stroke and potential etiology, secondary stroke prevention measures and importance of managing stroke risk factors, residual deficits and typical recovery time and answered all other questions to patient and nieces satisfaction  Frann Rider, AGNP-BC  [facility] [address] [place],  [address]  Phone [phone] Fax [phone] Note: This document was prepared with digital dictation and possible smart phrase technology. Any transcriptional errors that result from this process are unintentional.

## 2021-01-05 NOTE — Telephone Encounter (Signed)
The patient niece Ivin Booty (dpr) called to get help with her monitor. I called [employer] to get additional help. Transmission received.

## 2021-01-17 ENCOUNTER — Other Ambulatory Visit: Payer: Self-pay

## 2021-01-17 ENCOUNTER — Emergency Department (HOSPITAL_COMMUNITY): Payer: Medicare HMO

## 2021-01-17 ENCOUNTER — Observation Stay (HOSPITAL_COMMUNITY): Payer: Medicare HMO

## 2021-01-17 ENCOUNTER — Observation Stay (HOSPITAL_COMMUNITY)
Admission: EM | Admit: 2021-01-17 | Discharge: 2021-01-19 | Disposition: A | Discharge status: Home / Self Care | Payer: Medicare HMO | Attending: Internal Medicine | Admitting: Internal Medicine

## 2021-01-17 DIAGNOSIS — Z20822 Contact with and (suspected) exposure to covid-19: Secondary | ICD-10-CM | POA: Diagnosis not present

## 2021-01-17 DIAGNOSIS — N179 Acute kidney failure, unspecified: Secondary | ICD-10-CM | POA: Insufficient documentation

## 2021-01-17 DIAGNOSIS — I639 Cerebral infarction, unspecified: Principal | ICD-10-CM | POA: Diagnosis present

## 2021-01-17 DIAGNOSIS — I129 Hypertensive chronic kidney disease with stage 1 through stage 4 chronic kidney disease, or unspecified chronic kidney disease: Secondary | ICD-10-CM | POA: Insufficient documentation

## 2021-01-17 DIAGNOSIS — Z7982 Long term (current) use of aspirin: Secondary | ICD-10-CM | POA: Diagnosis not present

## 2021-01-17 DIAGNOSIS — K219 Gastro-esophageal reflux disease without esophagitis: Secondary | ICD-10-CM | POA: Diagnosis present

## 2021-01-17 DIAGNOSIS — R531 Weakness: Secondary | ICD-10-CM

## 2021-01-17 DIAGNOSIS — Z79899 Other long term (current) drug therapy: Secondary | ICD-10-CM | POA: Diagnosis not present

## 2021-01-17 DIAGNOSIS — F419 Anxiety disorder, unspecified: Secondary | ICD-10-CM | POA: Diagnosis present

## 2021-01-17 DIAGNOSIS — N1832 Chronic kidney disease, stage 3b: Secondary | ICD-10-CM | POA: Insufficient documentation

## 2021-01-17 DIAGNOSIS — F32A Depression, unspecified: Secondary | ICD-10-CM | POA: Diagnosis present

## 2021-01-17 DIAGNOSIS — I10 Essential (primary) hypertension: Secondary | ICD-10-CM

## 2021-01-17 DIAGNOSIS — R2681 Unsteadiness on feet: Secondary | ICD-10-CM | POA: Diagnosis not present

## 2021-01-17 DIAGNOSIS — M545 Low back pain, unspecified: Secondary | ICD-10-CM

## 2021-01-17 LAB — BASIC METABOLIC PANEL
Anion gap: 8 (ref 5–15)
BUN: 27 mg/dL — ABNORMAL HIGH (ref 8–23)
CO2: 25 mmol/L (ref 22–32)
Calcium: 9 mg/dL (ref 8.9–10.3)
Chloride: 100 mmol/L (ref 98–111)
Creatinine, Ser: 1.98 mg/dL — ABNORMAL HIGH (ref 0.44–1.00)
GFR, Estimated: 26 mL/min — ABNORMAL LOW (ref >60)
Glucose, Bld: 122 mg/dL — ABNORMAL HIGH (ref 70–99)
Potassium: 4.1 mmol/L (ref 3.5–5.1)
Sodium: 133 mmol/L — ABNORMAL LOW (ref 135–145)

## 2021-01-17 LAB — LIPID PANEL
Cholesterol: 122 mg/dL (ref 0–200)
HDL: 48 mg/dL (ref >40)
LDL Cholesterol: 57 mg/dL (ref 0–99)
Total CHOL/HDL Ratio: 2.5 RATIO
Triglycerides: 85 mg/dL (ref <150)
VLDL: 17 mg/dL (ref 0–40)

## 2021-01-17 LAB — CBC
HCT: 35.5 % — ABNORMAL LOW (ref 36.0–46.0)
Hemoglobin: 11.5 g/dL — ABNORMAL LOW (ref 12.0–15.0)
MCH: 27.5 pg (ref 26.0–34.0)
MCHC: 32.4 g/dL (ref 30.0–36.0)
MCV: 84.9 fL (ref 80.0–100.0)
Platelets: 351 10*3/uL (ref 150–400)
RBC: 4.18 MIL/uL (ref 3.87–5.11)
RDW: 15.5 % (ref 11.5–15.5)
WBC: 8.7 10*3/uL (ref 4.0–10.5)
nRBC: 0 % (ref 0.0–0.2)

## 2021-01-17 LAB — URINALYSIS, ROUTINE W REFLEX MICROSCOPIC
Bilirubin Urine: NEGATIVE
Glucose, UA: NEGATIVE mg/dL
Hgb urine dipstick: NEGATIVE
Ketones, ur: NEGATIVE mg/dL
Leukocytes,Ua: NEGATIVE
Nitrite: NEGATIVE
Protein, ur: NEGATIVE mg/dL
Specific Gravity, Urine: 1.012 (ref 1.005–1.030)
pH: 6 (ref 5.0–8.0)

## 2021-01-17 LAB — VITAMIN B12: Vitamin B-12: 248 pg/mL (ref 180–914)

## 2021-01-17 LAB — HEMOGLOBIN A1C
Hgb A1c MFr Bld: 5.9 % — ABNORMAL HIGH (ref 4.8–5.6)
Mean Plasma Glucose: 122.63 mg/dL

## 2021-01-17 LAB — RESP PANEL BY RT-PCR (FLU A&B, COVID) ARPGX2
Influenza A by PCR: NEGATIVE
Influenza B by PCR: NEGATIVE
SARS Coronavirus 2 by RT PCR: NEGATIVE

## 2021-01-17 LAB — CBG MONITORING, ED: Glucose-Capillary: 126 mg/dL — ABNORMAL HIGH (ref 70–99)

## 2021-01-17 LAB — TSH: TSH: 4.115 u[IU]/mL (ref 0.350–4.500)

## 2021-01-17 IMAGING — CT CT HEAD W/O CM
3 series · 15 of 47 positions shown, 18 images · non-contrast
Comparison: [DATE]

CLINICAL DATA: Patient complains of increased bilateral leg
weakness.

EXAM:
CT HEAD WITHOUT CONTRAST
TECHNIQUE: Contiguous axial images were obtained from the base of the skull
through the vertex without intravenous contrast.

[Series 3: head 5.0 h30s · axial · 0.45mm/px · z∈[-95,+35]mm · 9 of 32 slices shown, 12 images]
[im 3/32  brain]
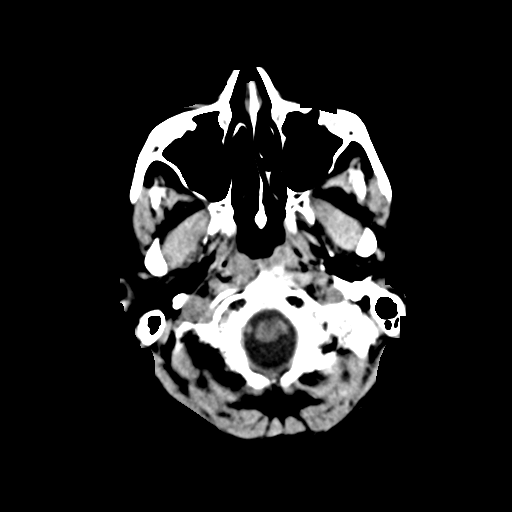
[im 3/32  bone]
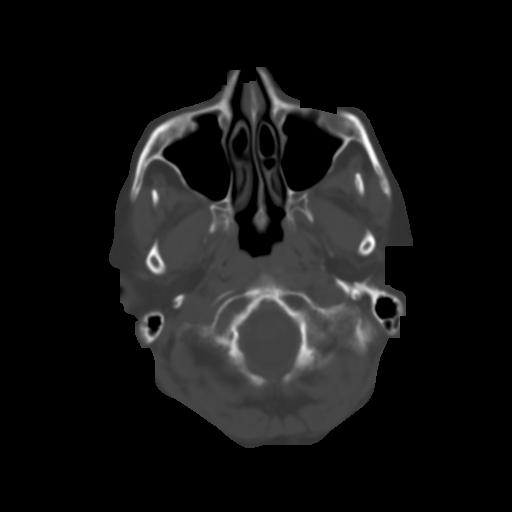
[im 6/32  brain]
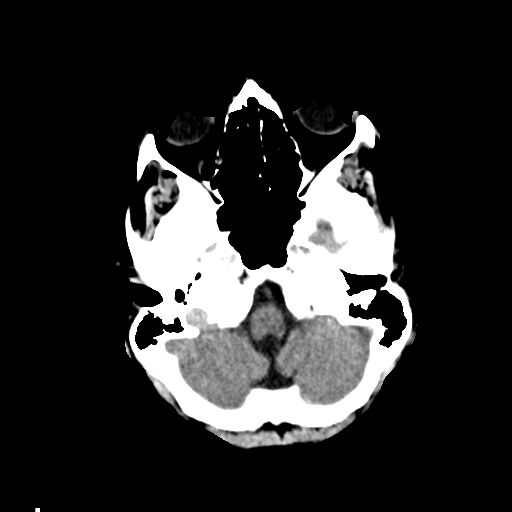
[im 9/32  brain]
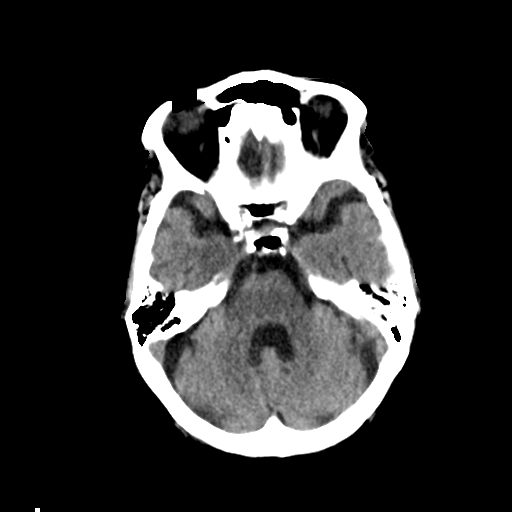
[im 12/32  brain]
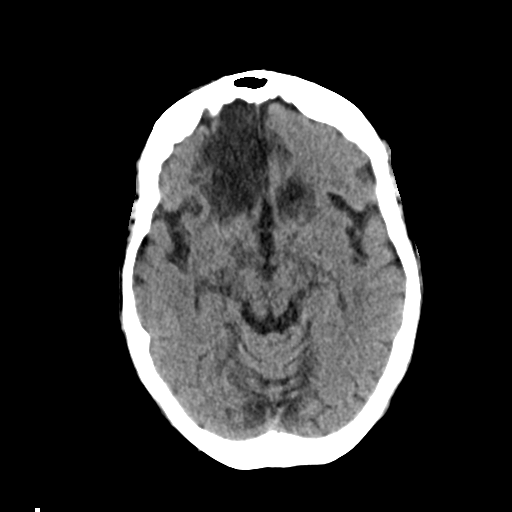
[im 17/32  brain]
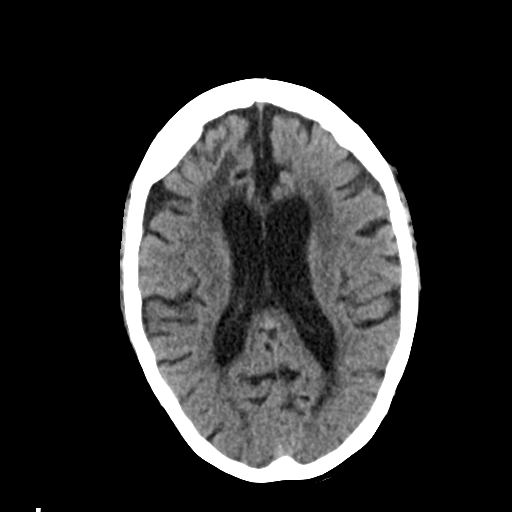
[im 17/32  bone]
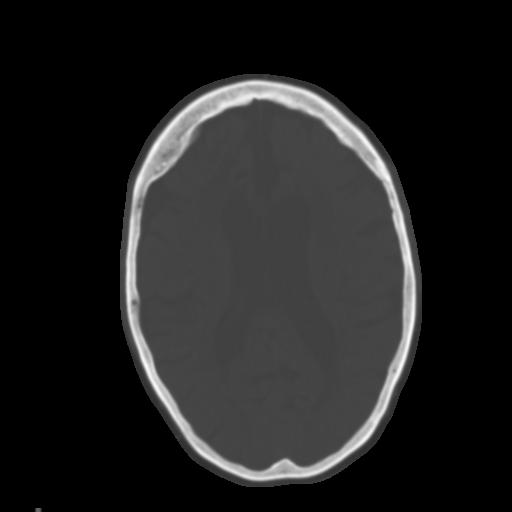
[im 20/32  brain]
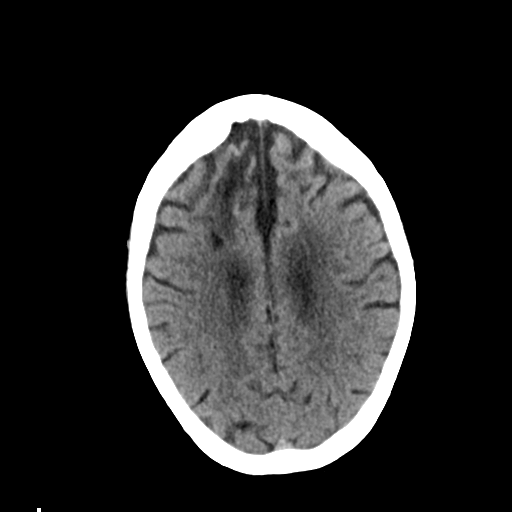
[im 23/32  brain]
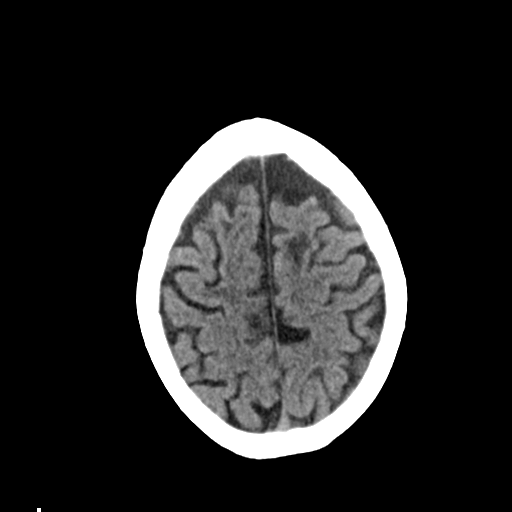
[im 26/32  brain]
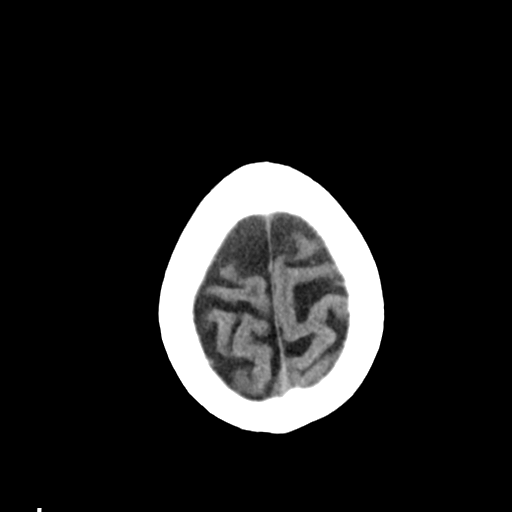
[im 29/32  brain]
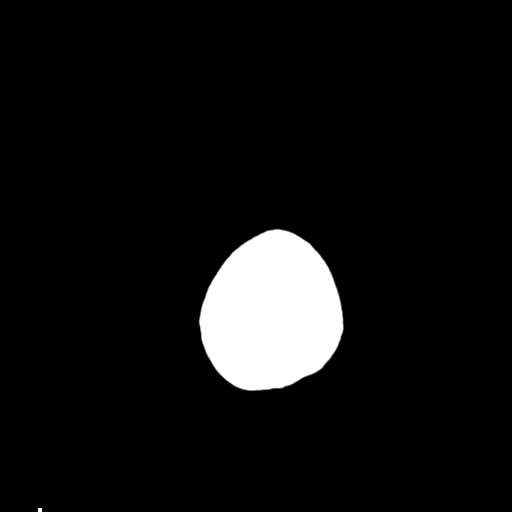
[im 29/32  bone]
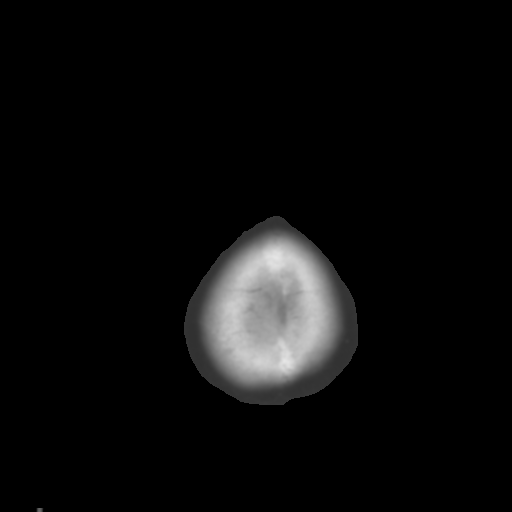

[Series 5: head 3.0 mpr cor · coronal · 0.36mm/px · 3 of 80 slices shown]
[im 27/80  brain]
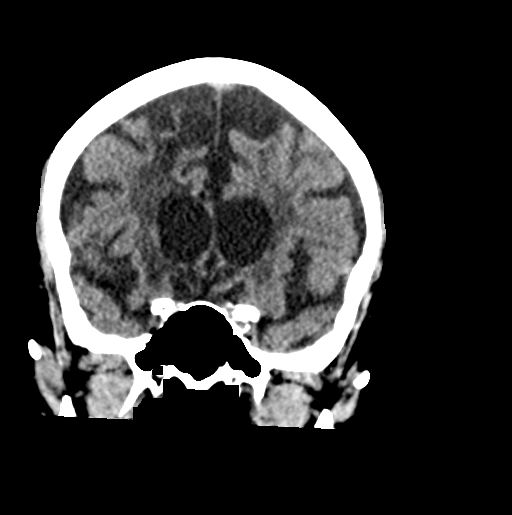
[im 36/80  brain]
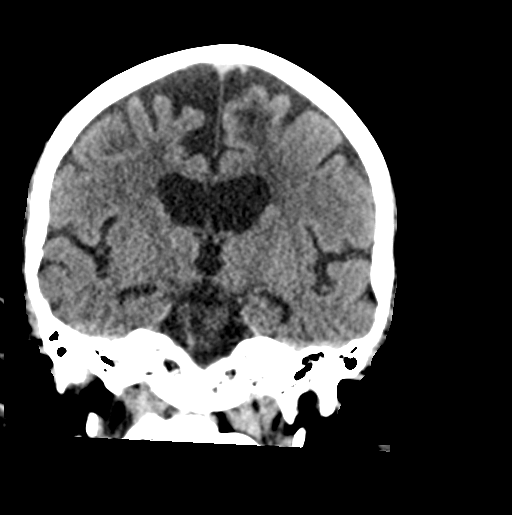
[im 44/80  brain]
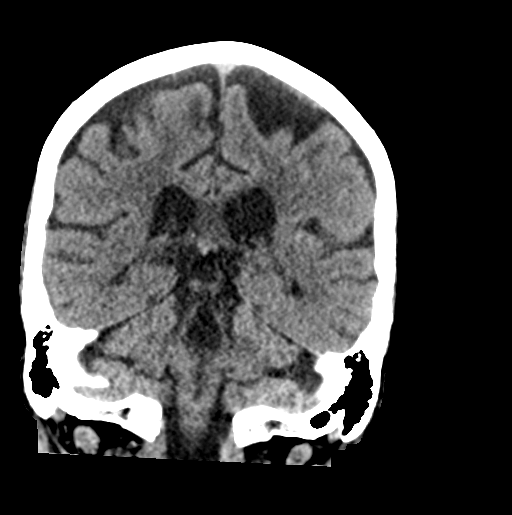

[Series 6: head 3.0 mpr sag · sagittal · 0.35mm/px · 3 of 67 slices shown]
[im 23/67  brain]
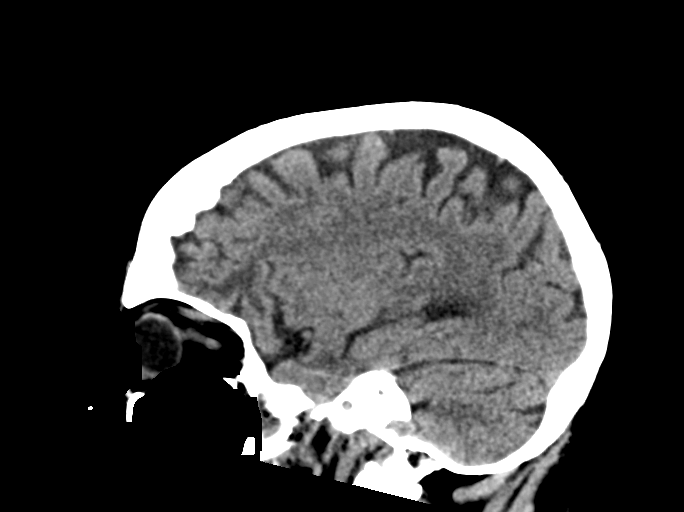
[im 34/67  brain]
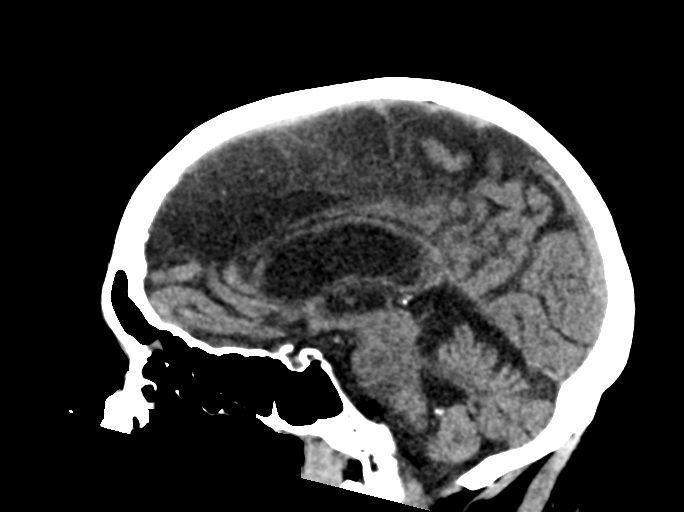
[im 45/67  brain]
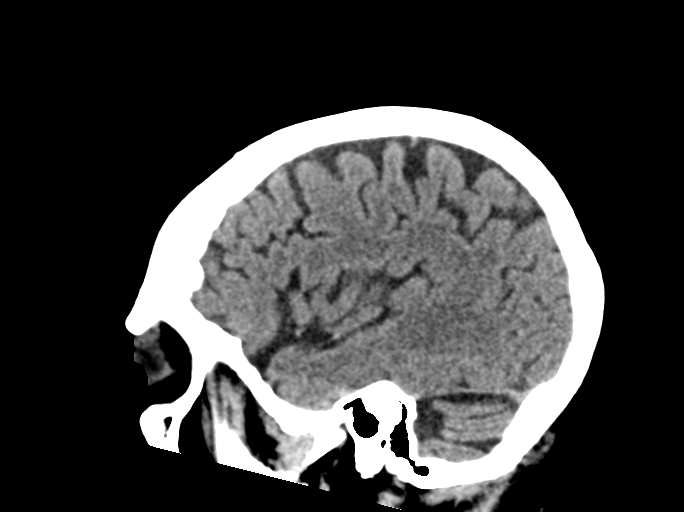

[15 of 47 positions shown; findings below may reference images not displayed]

FINDINGS: Brain: No evidence of acute infarction, hemorrhage, hydrocephalus,
extra-axial collection or mass lesion/mass effect. Encephalomalacia
within bilateral frontal lobes is again identified compatible with
previous infarcts. Chronic lacunar infarcts within the left basal
ganglia is noted and appears unchanged. There is moderate to marked
low-attenuation within the subcortical and periventricular white
matter compatible with chronic microvascular disease.

Vascular: No hyperdense vessel or unexpected calcification.

Skull: Normal. Negative for fracture or focal lesion.

Sinuses/Orbits: No acute finding.

Other: None.
IMPRESSION: 1. No acute intracranial abnormalities.
2. Chronic small vessel ischemic disease and brain atrophy.
3. Chronic bilateral frontal lobe and left basal ganglia infarcts.

## 2021-01-17 IMAGING — MR MR THORACIC SPINE W/O CM
4 of 6 series · 18 of 48 positions shown · non-contrast
Comparison: Prior MRI is not available for comparison.

CLINICAL DATA: Back pain, bilateral lower extremity weakness,
difficulty walking

EXAM:
MRI CERVICAL AND THORACIC SPINE WITHOUT CONTRAST
TECHNIQUE: Multiplanar and multiecho pulse sequences of the cervical spine, to
include the craniocervical junction and cervicothoracic junction,
and the thoracic spine, were obtained without intravenous contrast.

[Series 4: T1 · sagittal · 3.0mm · 0.90mm/px · 3 of 15 slices shown (1 of 2)]
[im 1/15]
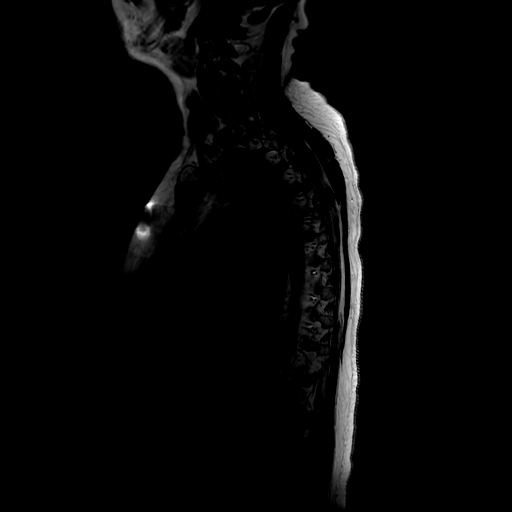
[im 8/15]
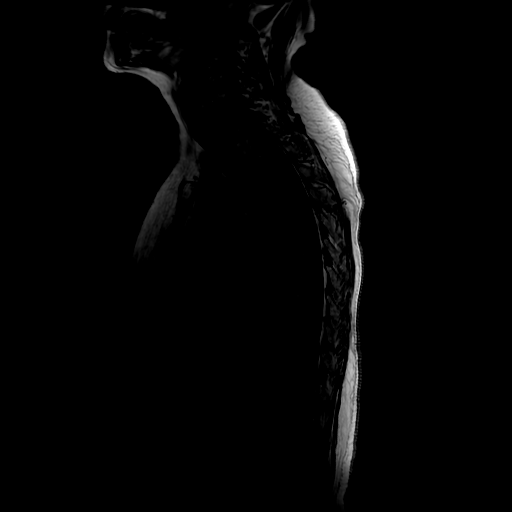
[im 15/15]
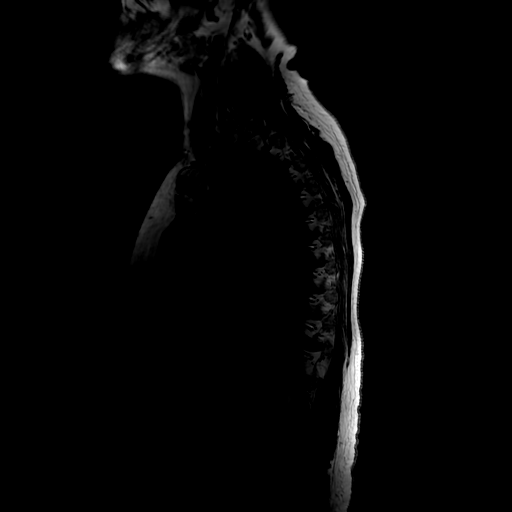

[Series 5: T2 · sagittal · 3.0mm · 0.66mm/px · 5 of 18 slices shown (1 of 2)]
[im 1/18]
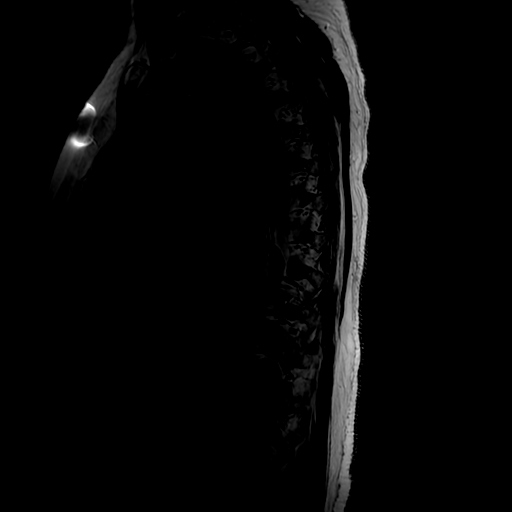
[im 5/18]
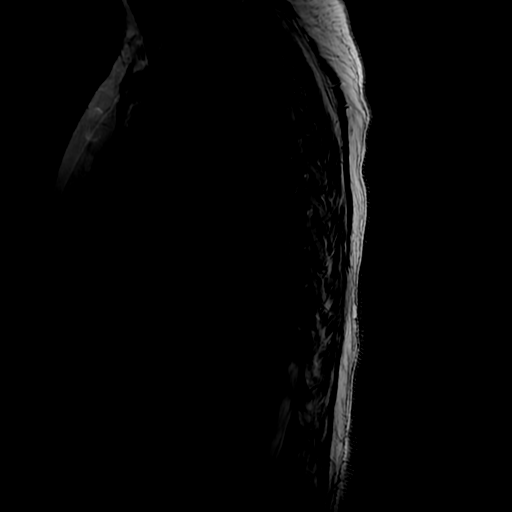
[im 9/18]
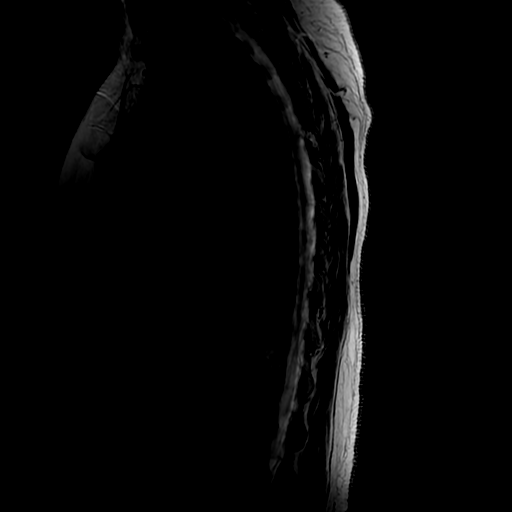
[im 13/18]
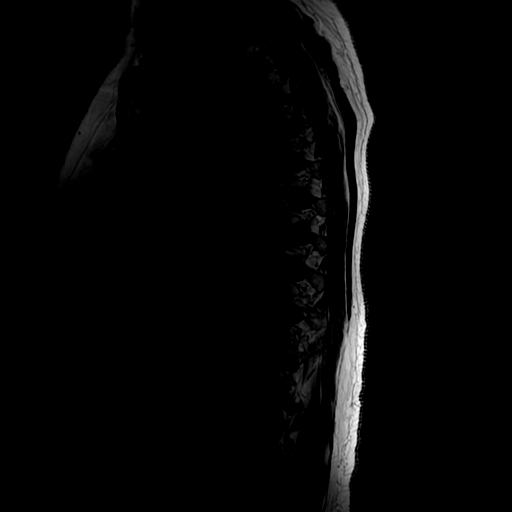
[im 18/18]
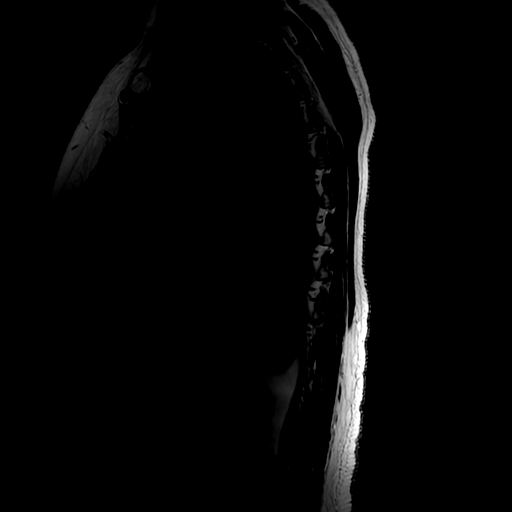

[Series 7: T1 · sagittal · 3.0mm · 0.66mm/px · 3 of 18 slices shown (2 of 2)]
[im 1/18]
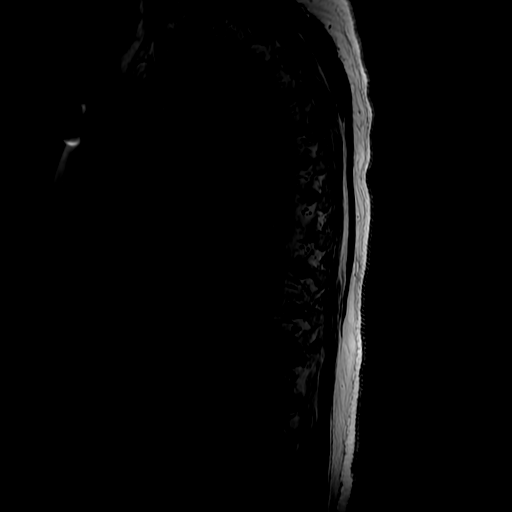
[im 9/18]
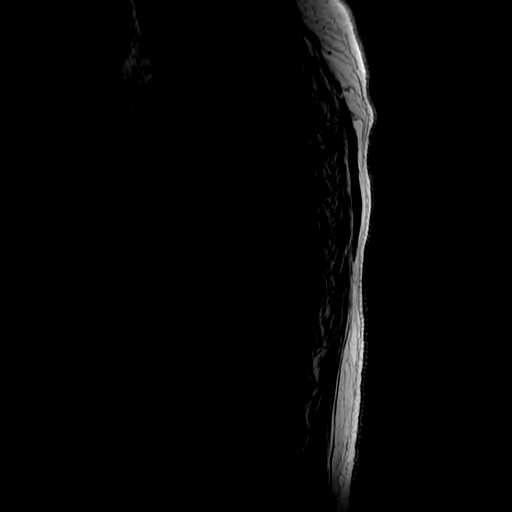
[im 18/18]
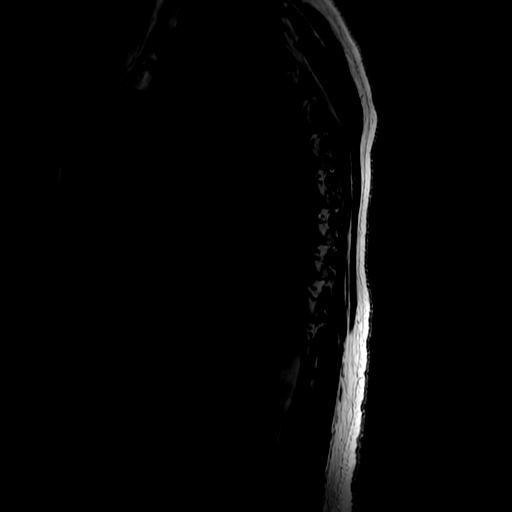

[Series 8: T2 · axial · 4.0mm · 0.39mm/px · z∈[-480,-211]mm · 7 of 59 slices shown (2 of 2)]
[im 1/59]
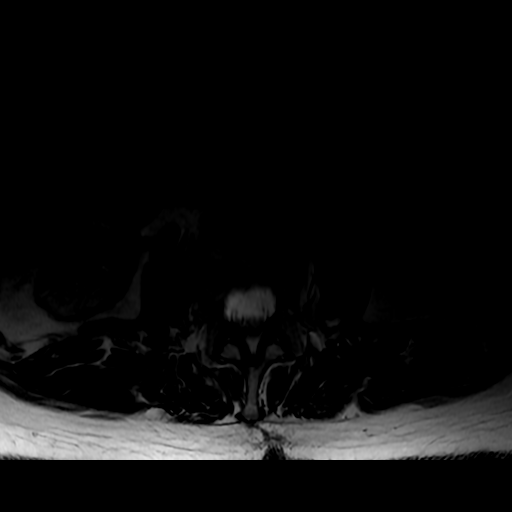
[im 9/59]
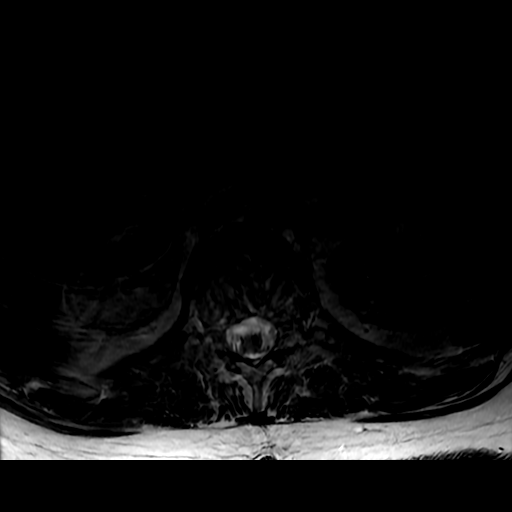
[im 17/59]
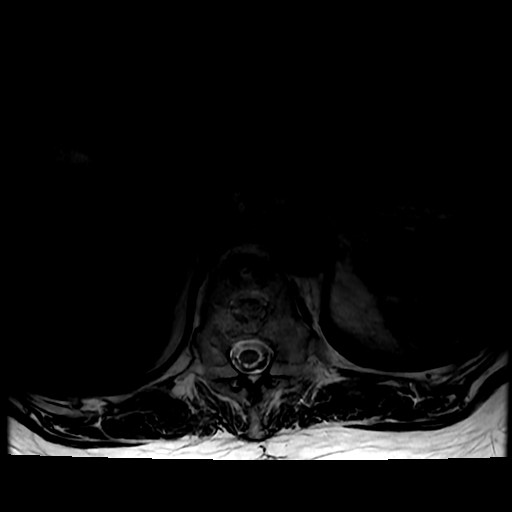
[im 25/59]
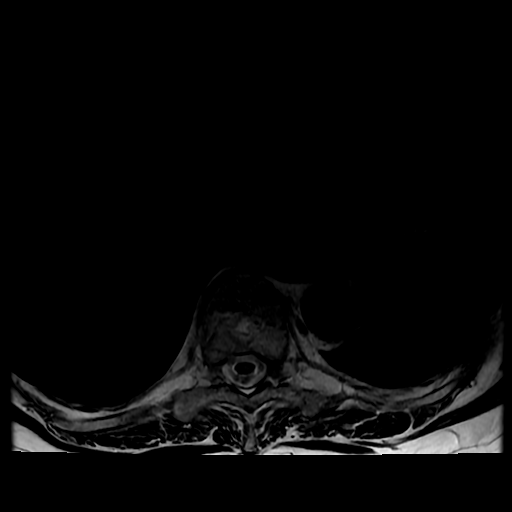
[im 30/59]
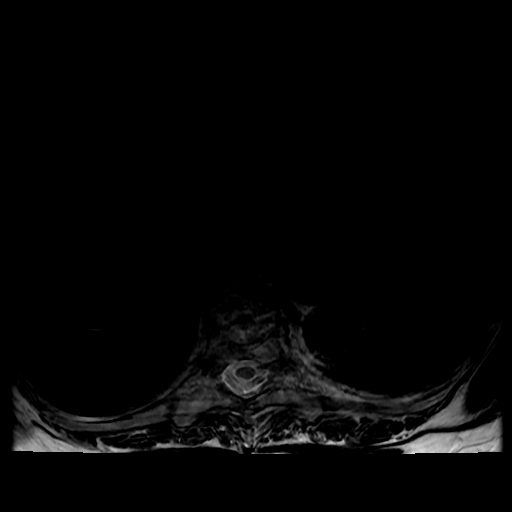
[im 34/59]
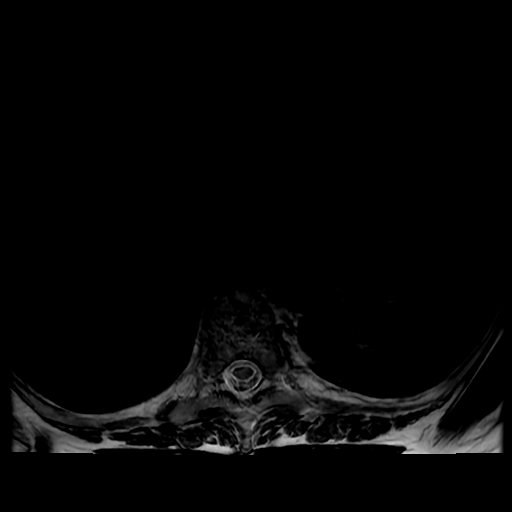
[im 50/59]
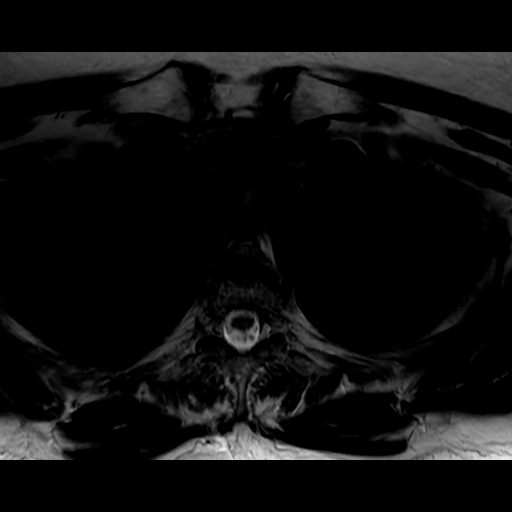

[18 of 48 positions shown; findings below may reference images not displayed]

FINDINGS: MRI CERVICAL SPINE FINDINGS

Evaluation is somewhat limited by motion artifact.

Alignment: Physiologic.

Vertebrae: No fracture, evidence of discitis, or bone lesion.

Cord: Normal signal and morphology.

Posterior Fossa, vertebral arteries, paraspinal tissues: Please see
same day MRI for posterior fossa findings and same-day MRA for
vertebral artery findings. The paraspinal tissues are normal.

Disc levels:

C2-C3: Minimal disc bulge. Facet arthropathy. No spinal canal
stenosis or neural foraminal narrowing.

C3-C4: No significant disc bulge. No spinal canal stenosis.
Left-greater-than-right facet arthropathy. Uncovertebral
hypertrophy. Mild right neural foraminal narrowing.

C4-C5: Right eccentric disc bulge. Right greater than left
uncovertebral hypertrophy. Facet arthropathy. No spinal canal
stenosis. Moderate right and mild left neural foraminal narrowing.

C5-C6: Mild right eccentric disc bulge, which abuts the ventral
cord. Mild spinal canal stenosis. Uncovertebral and facet
arthropathy. Severe right and mild left neural foraminal narrowing.

C6-C7: Broad-based disc bulge. No spinal canal stenosis.
Uncovertebral and facet arthropathy. Severe left and moderate to
severe right neural foraminal narrowing.

C7-T1: No significant disc bulge. No spinal canal stenosis or
neuroforaminal narrowing.

MRI THORACIC SPINE FINDINGS

Alignment:  Physiologic.

Vertebrae: No acute fracture or suspicious osseous lesion.

Cord:  Normal signal and morphology.

Paraspinal and other soft tissues: Negative.

Disc levels:

No spinal canal stenosis. No significant neural foraminal narrowing.
Some small disc bulges and facet arthropathy.
IMPRESSION: 1. C5-C6 mild spinal canal stenosis, with severe right and mild left
neural foraminal narrowing.
2. C6-C7 severe left and moderate to severe right neural foraminal
narrowing.
3. C4-C5 moderate right and mild left neural foraminal narrowing.
4. No significant spinal canal stenosis or neural foraminal
narrowing in the thoracic spine.

## 2021-01-17 IMAGING — MR MR HEAD W/O CM
9 of 10 series · 35 of 48 positions shown · non-contrast
Comparison: [DATE] CT, CTA, and MRI.

CLINICAL DATA: Neuro deficit, stroke suspected

EXAM:
MRI HEAD WITHOUT CONTRAST
TECHNIQUE: Multiplanar, multiecho pulse sequences of the brain and surrounding
structures were obtained without intravenous contrast.

[Series 3: DWI · axial · 3.0mm · 1.09mm/px · z∈[-121,+25]mm · 8 of 102 slices shown (1 of 4)]
[im 1/102]
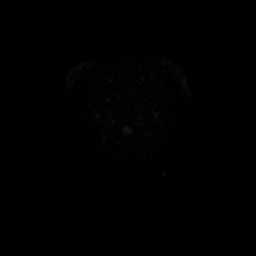
[im 12/102]
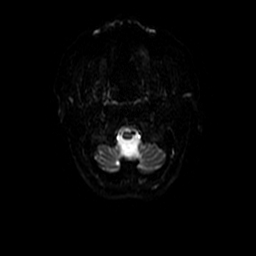
[im 34/102]
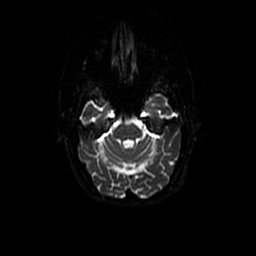
[im 45/102]
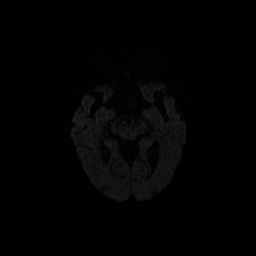
[im 57/102]
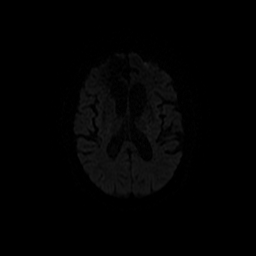
[im 68/102]
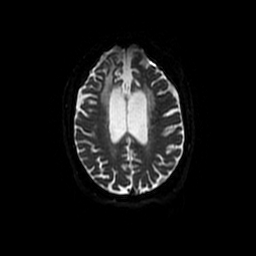
[im 90/102]
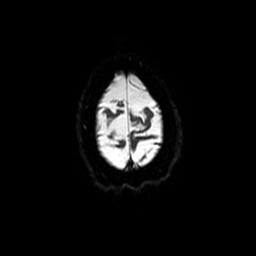
[im 102/102]
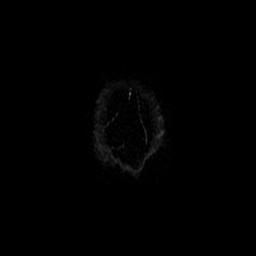

[Series 4: DWI · coronal · 5.0mm · 1.09mm/px · 7 of 72 slices shown (2 of 4)]
[im 1/72]
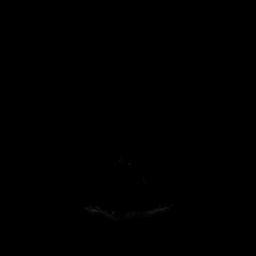
[im 12/72]
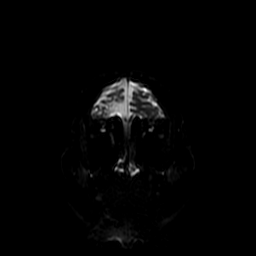
[im 24/72]
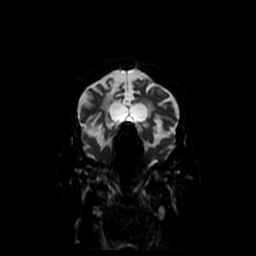
[im 36/72]
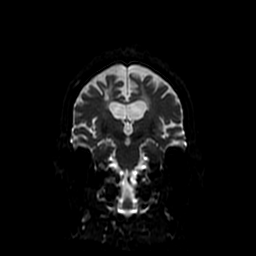
[im 48/72]
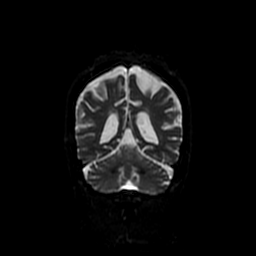
[im 60/72]
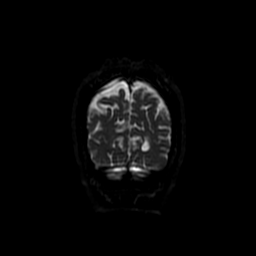
[im 72/72]
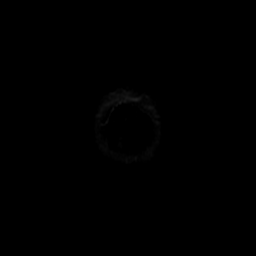

[Series 5: T1 · sagittal · 5.0mm · 0.47mm/px · 2 of 23 slices shown (1 of 2)]
[im 1/23]
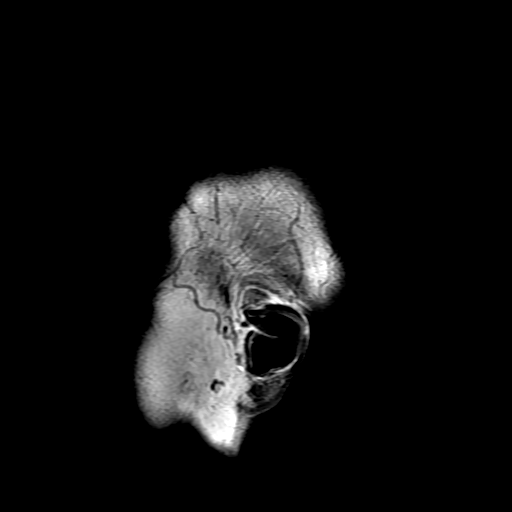
[im 23/23]
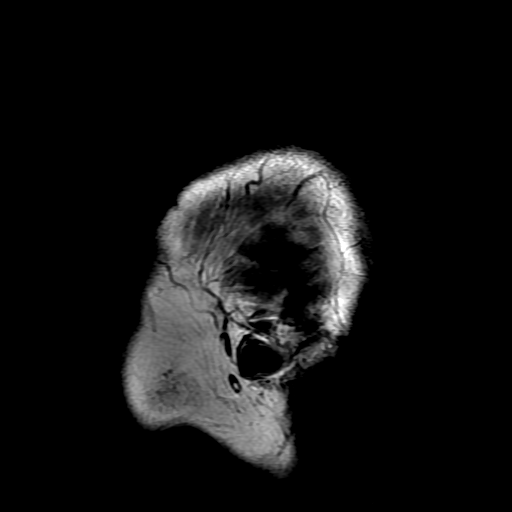

[Series 6: T2 · axial · 5.0mm · 0.43mm/px · z∈[-121,+29]mm · 3 of 27 slices shown (1 of 2)]
[im 1/27]
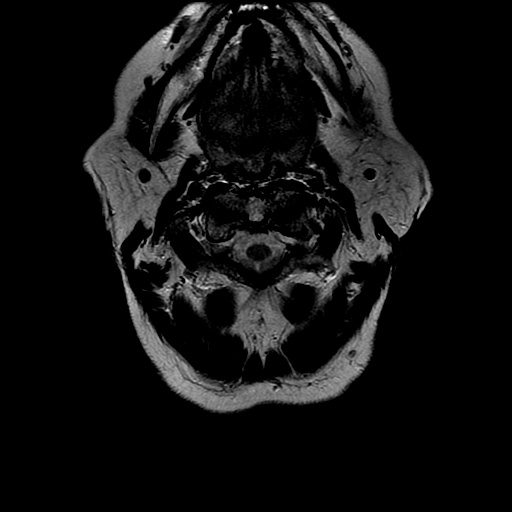
[im 14/27]
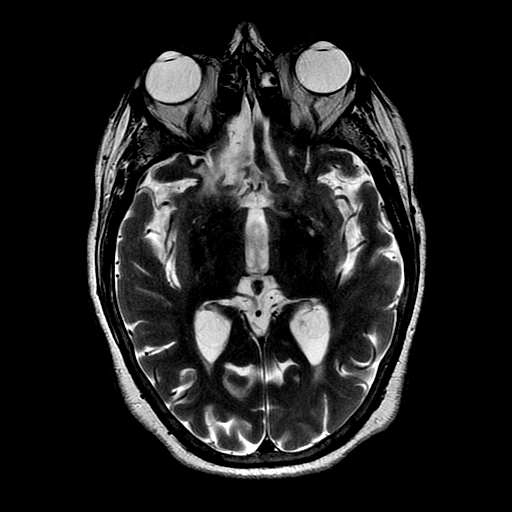
[im 27/27]
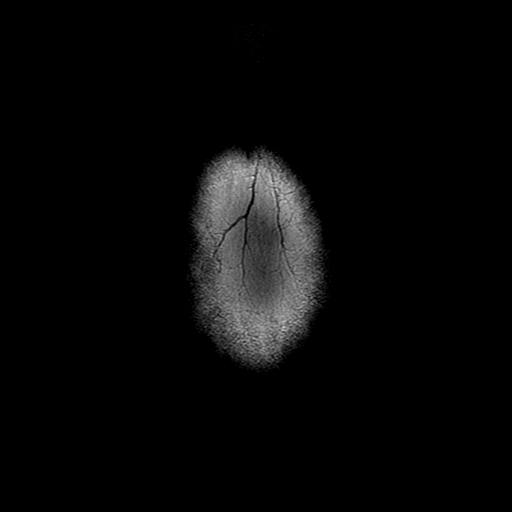

[Series 7: FLAIR · axial · 3.0mm · 0.43mm/px · z∈[-121,+29]mm · 3 of 27 slices shown]
[im 1/27]
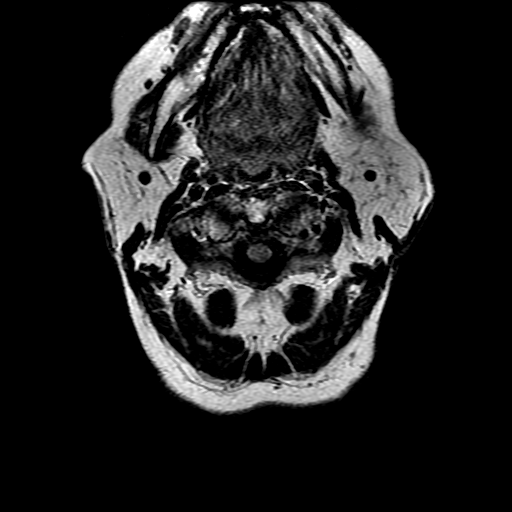
[im 14/27]
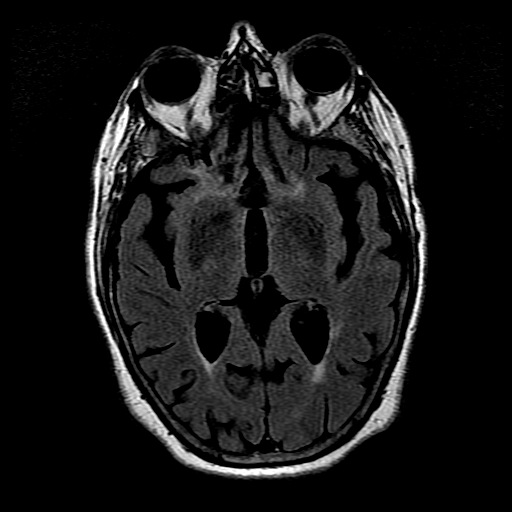
[im 27/27]
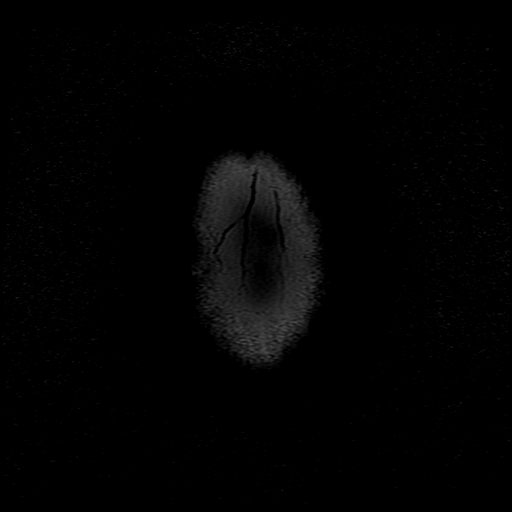

[Series 9: T1 · axial · 3.0mm · 0.47mm/px · 1 of 108 slices shown (2 of 2)]
[im 1/108]
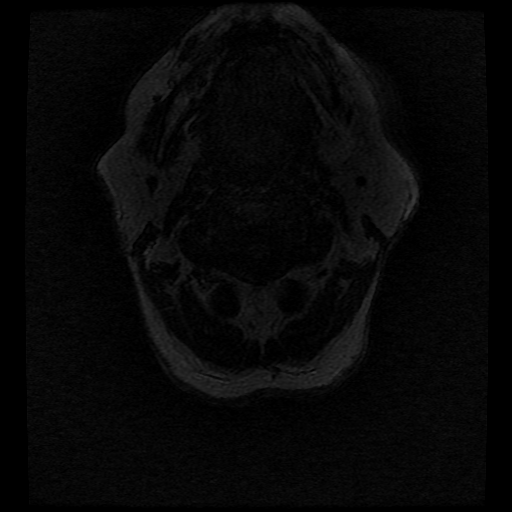

[Series 10: T2 · coronal · 5.0mm · 0.39mm/px · 3 of 28 slices shown (2 of 2)]
[im 1/28]
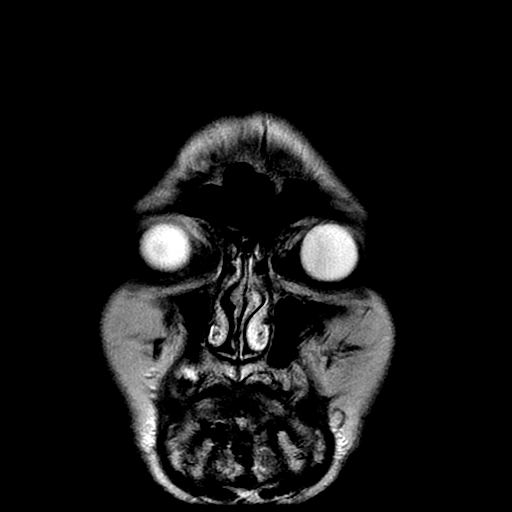
[im 14/28]
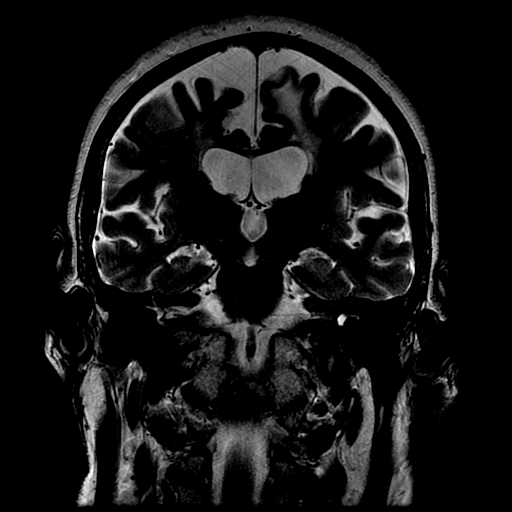
[im 28/28]
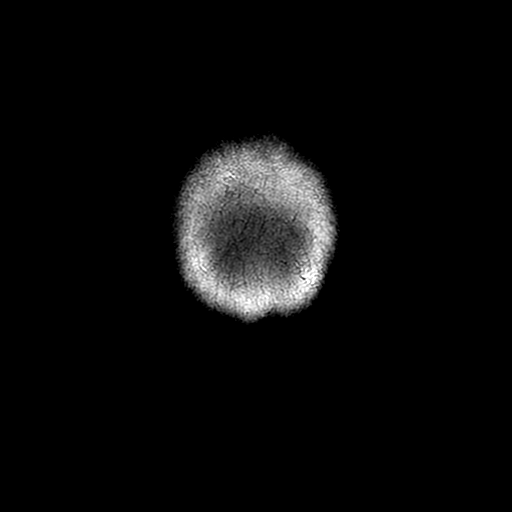

[Series 300: DWI · axial · 3.0mm · 1.09mm/px · z∈[-121,+25]mm · 5 of 51 slices shown (3 of 4)]
[im 1/51]
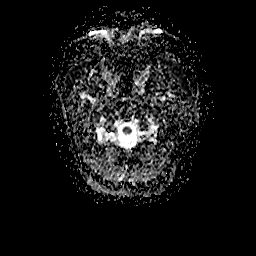
[im 13/51]
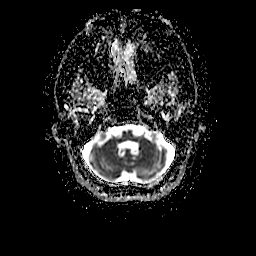
[im 26/51]
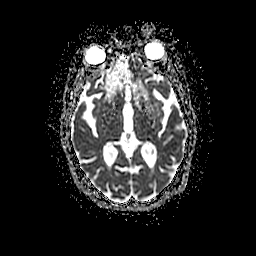
[im 38/51]
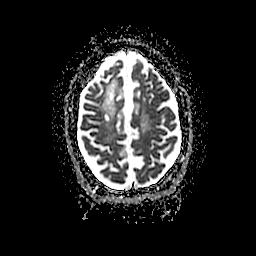
[im 51/51]
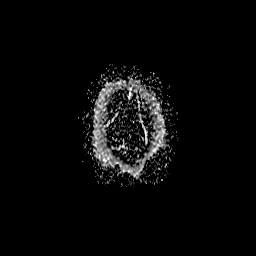

[Series 400: DWI · coronal · 5.0mm · 1.09mm/px · 3 of 35 slices shown (4 of 4)]
[im 1/35]
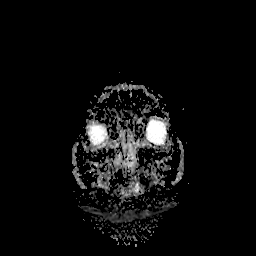
[im 18/35]
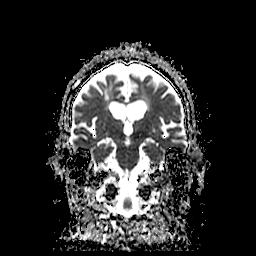
[im 35/35]
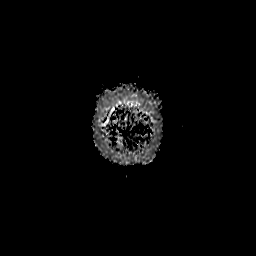

[35 of 48 positions shown; findings below may reference images not displayed]

FINDINGS: Brain: Subcentimeter focus of restricted diffusion in the left
anterior frontal lobe (series 3, image 37 and series 4, image 32),
with ADC correlate and without significant increased T2 signal.
Sequela of prior right greater than left frontal lobe infarcts with
associated encephalomalacia and gliosis. Lacunar infarcts in the
right greater than left corona radiata. Confluent T2 hyperintense
signal in the periventricular white matter, likely the sequela of
severe chronic small vessel ischemic disease.

No mass, mass effect, or midline shift. No acute hemorrhage. No
hydrocephalus or extra-axial collection.

Vascular: Loss of the flow void in right vertebral artery, unchanged
from the prior exam. Otherwise normal flow voids.

Skull and upper cervical spine: Normal marrow signal.

Sinuses/Orbits: Minimal mucosal thickening in the ethmoid air cells.
Status post bilateral lens replacements.

Other: The mastoids are well aerated.
IMPRESSION: 1. Subcentimeter focus of restricted diffusion in the left anterior
frontal lobe, consistent with acute infarct.
2. Redemonstrated sequela of prior right-greater-than-left frontal
and parietal lobe infarcts.
3. Redemonstrated poor flow void in the right vertebral artery which
is unchanged and correlates with poor flow in the right vertebral
artery on the [DATE] CTA.

These results were called by telephone at the time of interpretation
on [DATE] at [DATE] to provider [email or web address] , who verbally
acknowledged these results.

## 2021-01-17 IMAGING — DX DG PELVIS 1-2V
1 series · 1 of 1 positions shown · non-contrast
Comparison: None.

CLINICAL DATA: Bilateral hip pain

EXAM:
PELVIS - 1 VIEW

[pelvis ap]
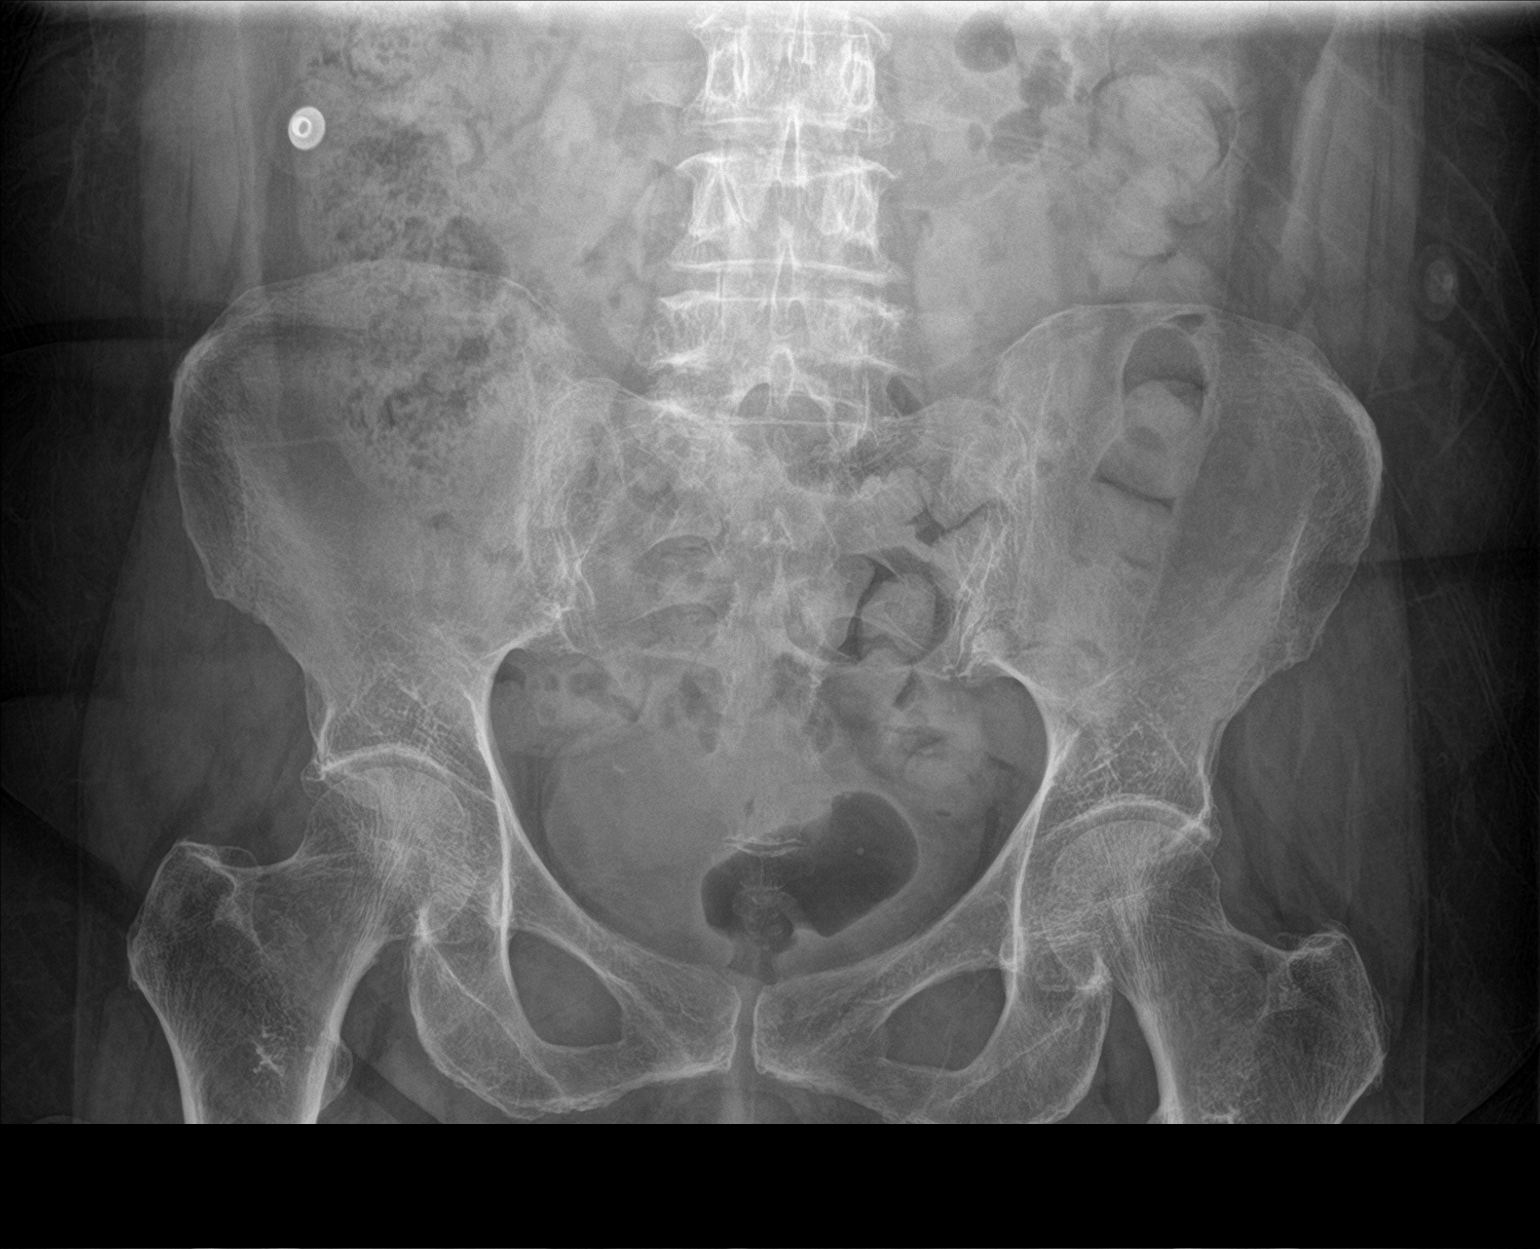

[1 of 1 positions shown; findings below may reference images not displayed]

FINDINGS: There is no evidence of pelvic fracture or diastasis. Mild
degenerative changes of the bilateral SI joints. Minimal
degenerative changes of the bilateral hip joints. Soft tissues are
unremarkable.
IMPRESSION: No acute osseous abnormality.

## 2021-01-17 IMAGING — MR MR MRA NECK W/O CM
1 of 3 series · 18 of 48 positions shown · non-contrast
Comparison: MRI brain [DATE], correlation is also made with CTA
head neck [DATE].

CLINICAL DATA: Neuro deficit, stroke suspected

EXAM:
MRA NECK WITHOUT CONTRAST
MRA HEAD WITHOUT CONTRAST
TECHNIQUE: Angiographic images of the Circle of Willis were acquired using MRA
technique without intravenous contrast.

[Series 3: sag inhance (id) · sagittal · 1.2mm · 0.47mm/px · 18 of 357 slices shown]
[im 1/357]
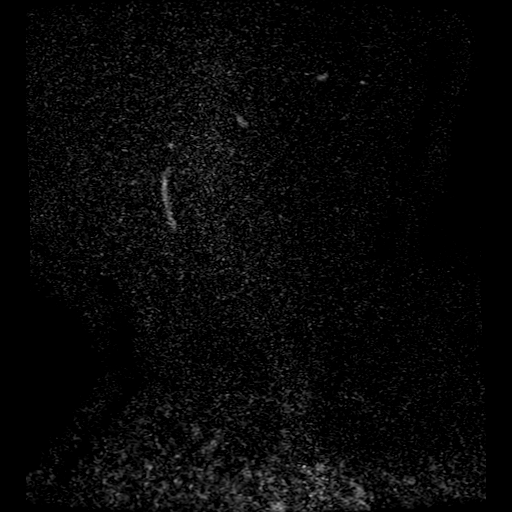
[im 12/357]
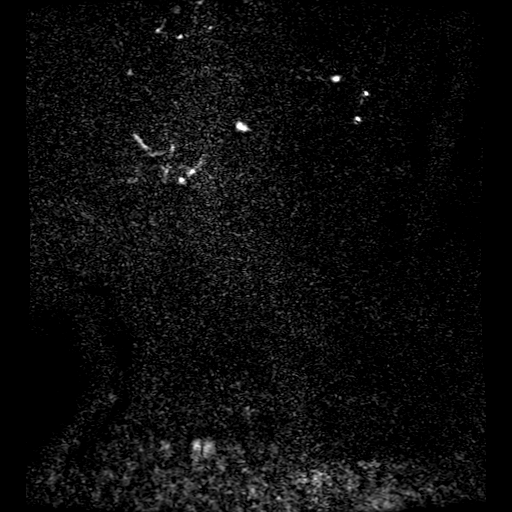
[im 23/357]
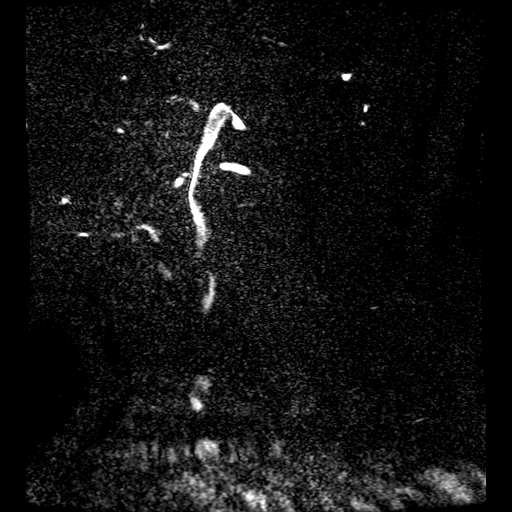
[im 34/357]
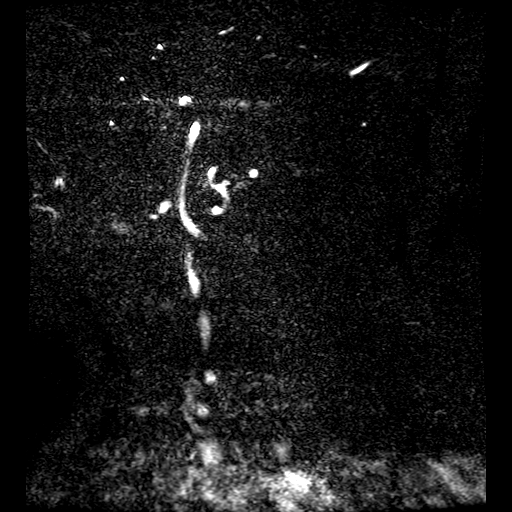
[im 45/357]
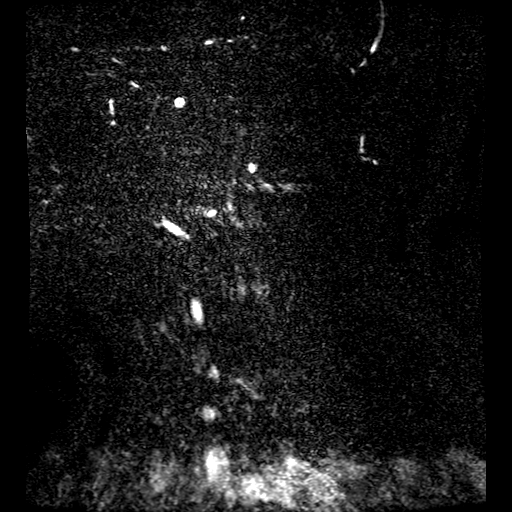
[im 56/357]
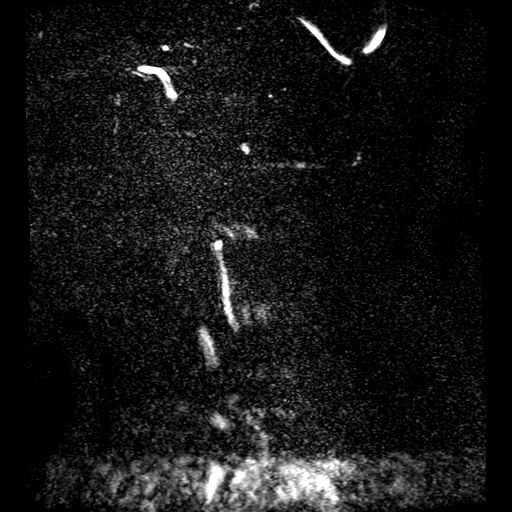
[im 67/357]
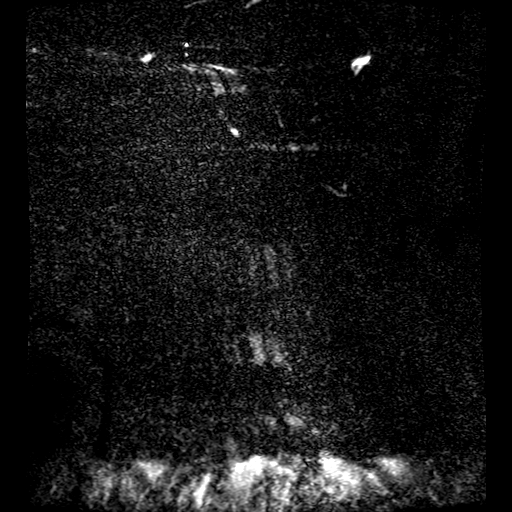
[im 78/357]
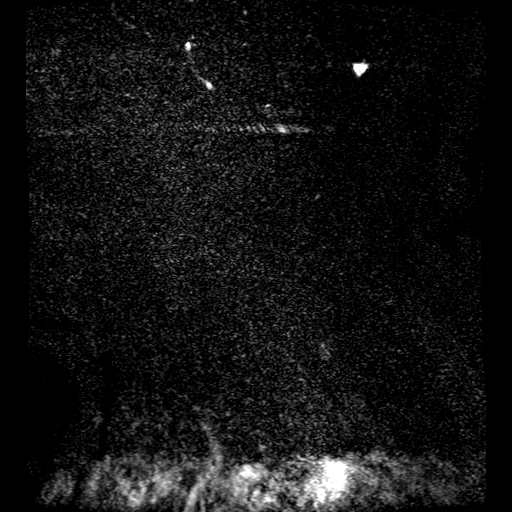
[im 90/357]
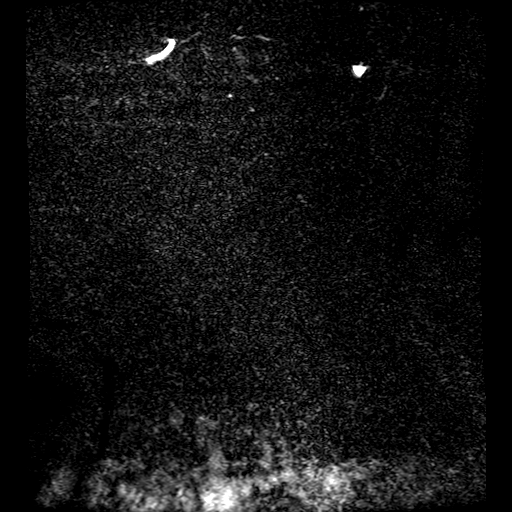
[im 101/357]
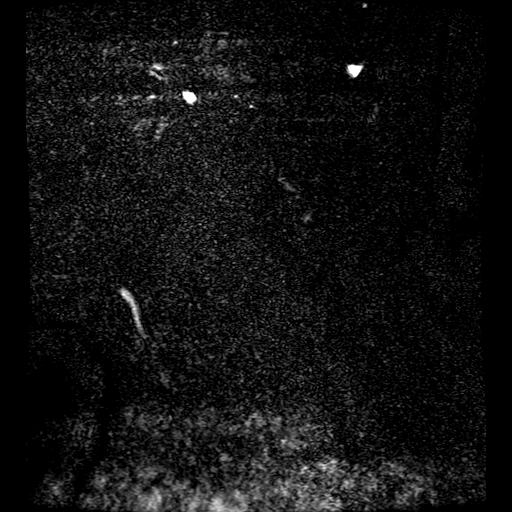
[im 112/357]
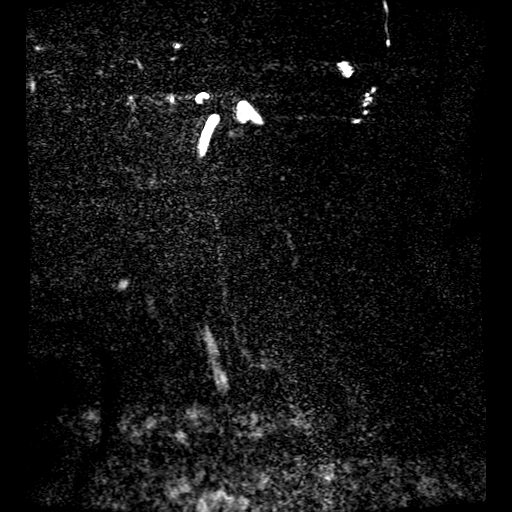
[im 156/357]
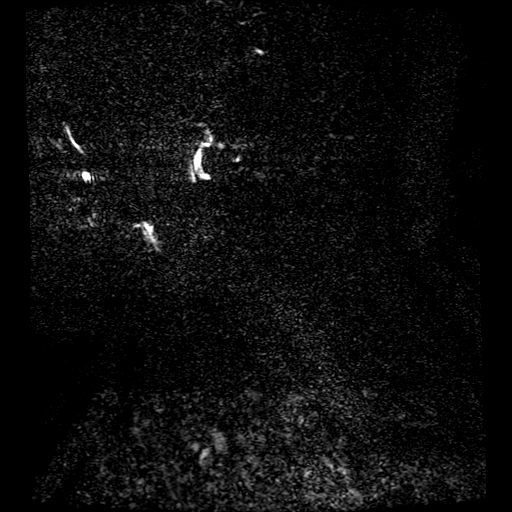
[im 179/357]
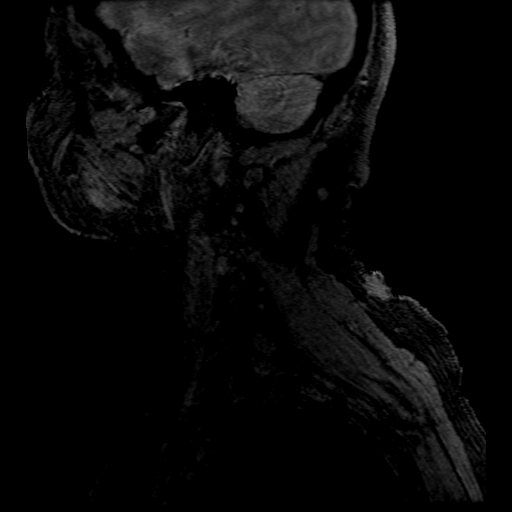
[im 201/357]
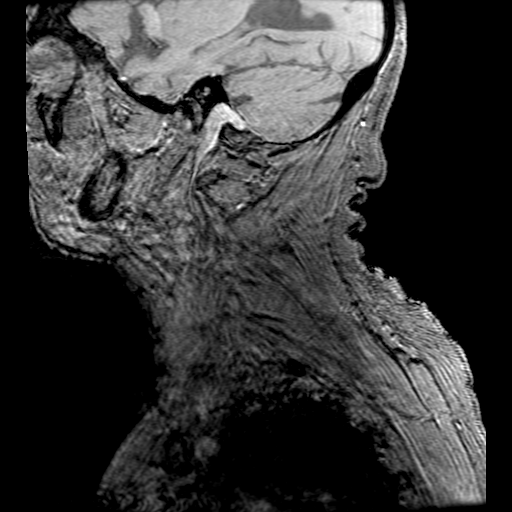
[im 245/357]
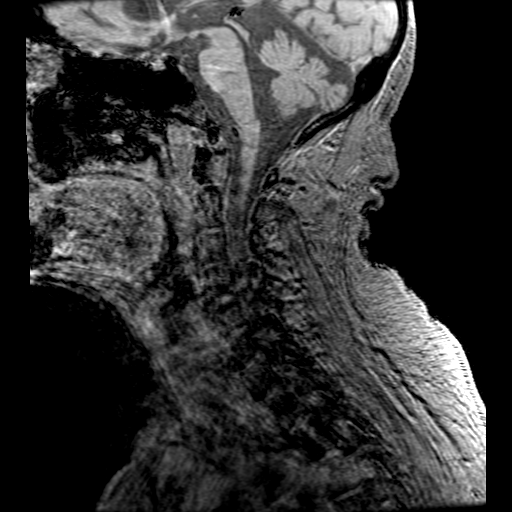
[im 290/357]
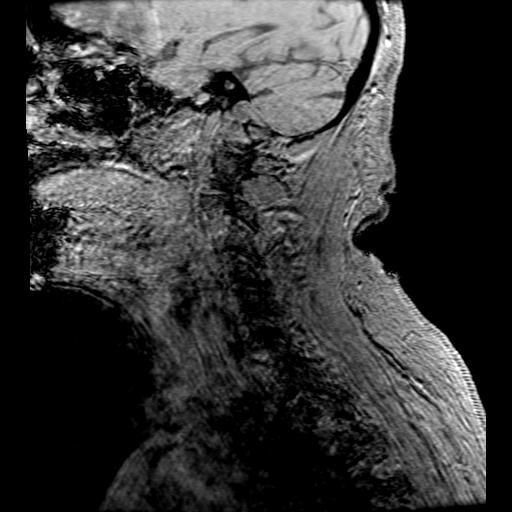
[im 301/357]
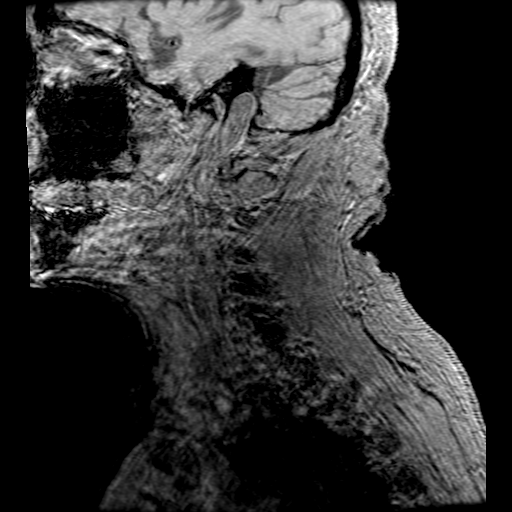
[im 334/357]
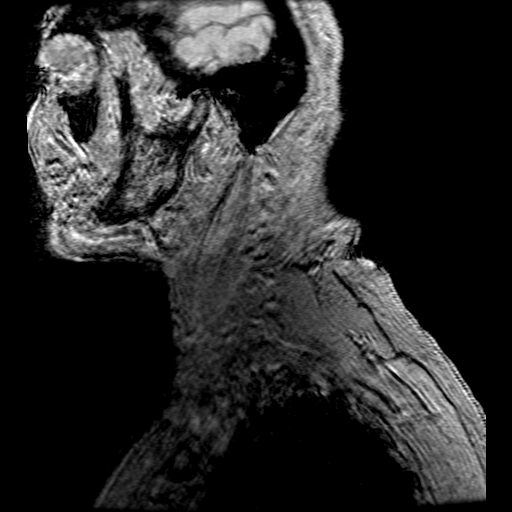

[18 of 48 positions shown; findings below may reference images not displayed]

FINDINGS: MRA NECK FINDINGS

Standard aortic branching. No acute dissection or aneurysm in the
imaged portion.

The bilateral common and internal carotid arteries are patent,
without significant stenosis.

The left vertebral artery is patent from its origin to the
vertebrobasilar junction. The right vertebral artery is poorly
visualized at its origin, possibly due to artifact; the right V2 and
V3 segments are diminutive. The right V4 segment does not
demonstrate flow related signal, likely unchanged from the prior
CTA.

MRA HEAD FINDINGS

Both internal carotid arteries are patent to the termini, with
multifocal irregularity in the carotid siphons, likely atheromatous
disease. The left A1 segment is not visualized. The right A1 segment
is irregular with a focal area of narrowing proximal to the anterior
communicating artery (series 3, image 89). Normal anterior
communicating artery. Focal narrowing in the proximal right A2
segment (series 3, image 91). More distal ACA segments demonstrate
poor flow and multifocal narrowing. Multifocal narrowing in the
bilateral M1 segments, most prominent in the right M1. Normal MCA
bifurcations. Distal MCA branches demonstrate multifocal narrowing
but appear perfused to their distal aspects.

No flow is seen in the intracranial right vertebral artery. The left
vertebral artery is patent to the vertebrobasilar junction.
Multifocal narrowing of the basilar artery. Superior cerebellar
arteries are patent bilaterally. Poor flow is seen in the right PCA,
with narrowing of the right P1 (series 3, image 84) and poor
visualization of the remainder of the right PCA. Focal narrowing of
the proximal left P2 segment (series 3, image 81), with a patent but
irregular left PCA.
IMPRESSION: 1. Severe multifocal narrowing of the right PCA and bilateral ACA.
Less significant irregularity is seen the bilateral MCAs, basilar
artery, and left PCA. No large vessel occlusion.
2. No hemodynamically significant stenosis in the neck.
Redemonstrated diminutive right vertebral artery, with absence of
flow in the V4 segment, which is not significantly different from
the [DATE] CTA.

## 2021-01-17 IMAGING — MR MR CERVICAL SPINE W/O CM
4 of 5 series · 18 of 48 positions shown · non-contrast
Comparison: Prior MRI is not available for comparison.

CLINICAL DATA: Back pain, bilateral lower extremity weakness,
difficulty walking

EXAM:
MRI CERVICAL AND THORACIC SPINE WITHOUT CONTRAST
TECHNIQUE: Multiplanar and multiecho pulse sequences of the cervical spine, to
include the craniocervical junction and cervicothoracic junction,
and the thoracic spine, were obtained without intravenous contrast.

[Series 3: T2 · sagittal · 3.0mm · 0.35mm/px · 3 of 18 slices shown (1 of 2)]
[im 1/18]
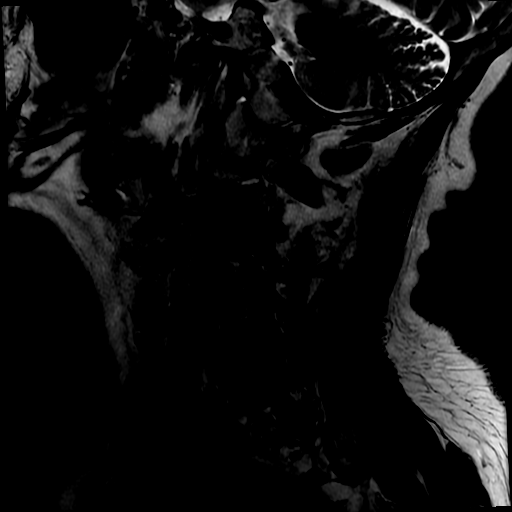
[im 9/18]
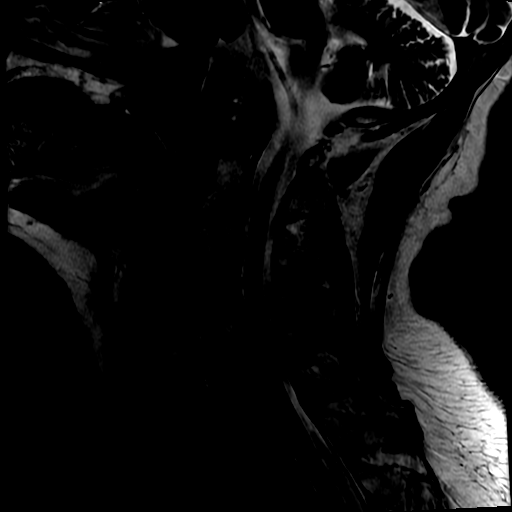
[im 18/18]
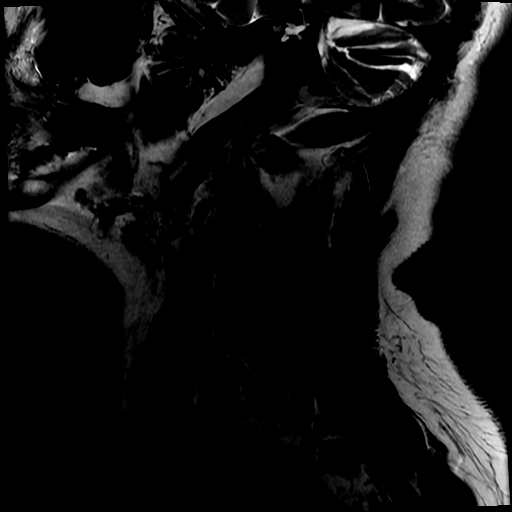

[Series 5: STIR · sagittal · 3.0mm · 0.35mm/px · 3 of 20 slices shown]
[im 1/20]
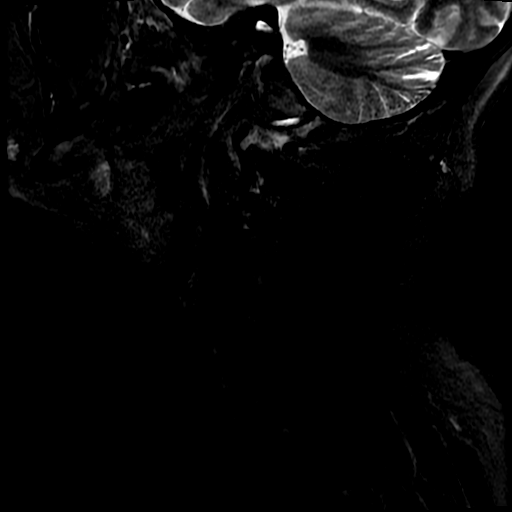
[im 10/20]
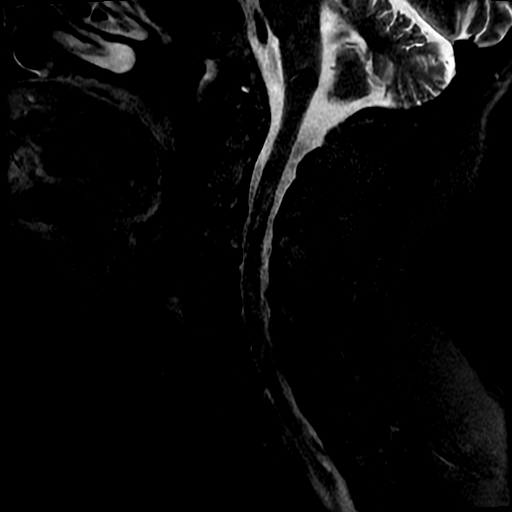
[im 20/20]
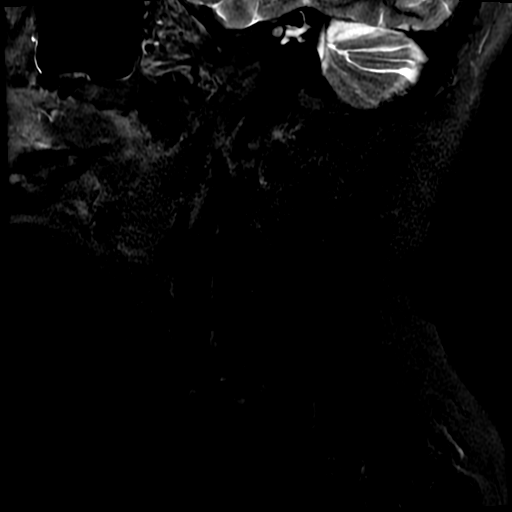

[Series 6: FLAIR · sagittal · 3.0mm · 0.35mm/px · 3 of 20 slices shown]
[im 1/20]
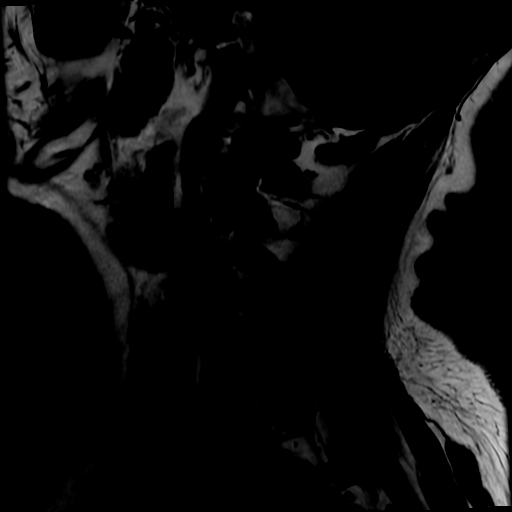
[im 10/20]
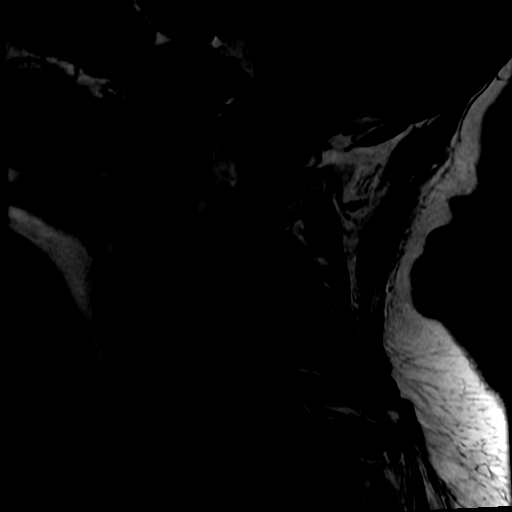
[im 20/20]
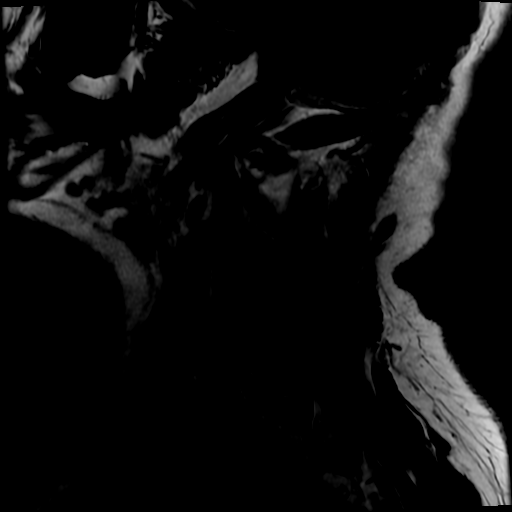

[Series 8: T2 · axial · 3.0mm · 0.35mm/px · z∈[-201,-65]mm · 9 of 41 slices shown (2 of 2)]
[im 1/41]
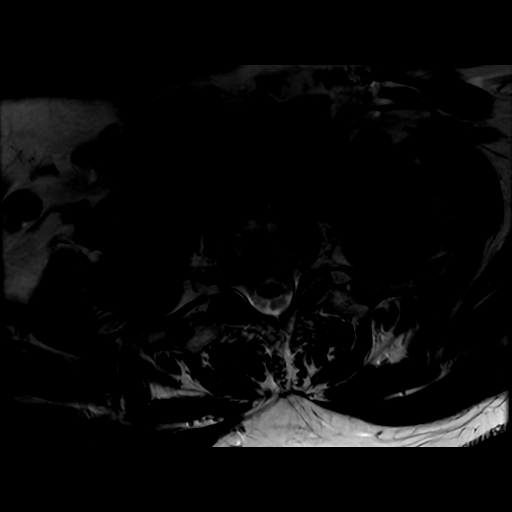
[im 6/41]
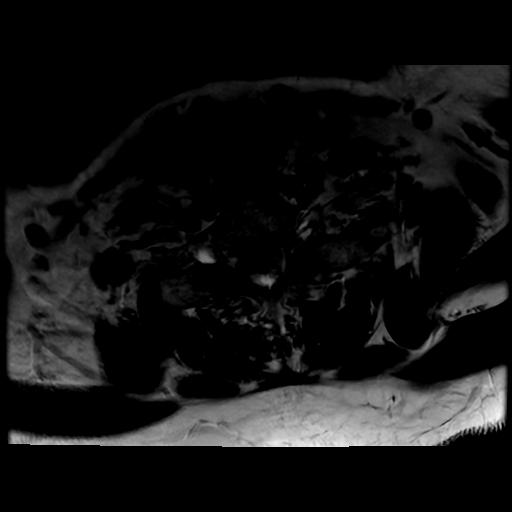
[im 11/41]
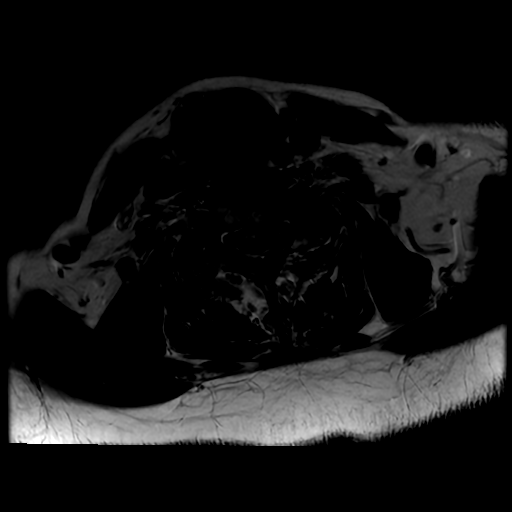
[im 16/41]
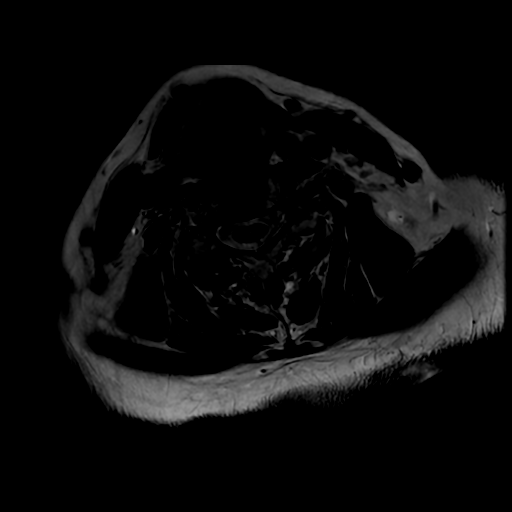
[im 21/41]
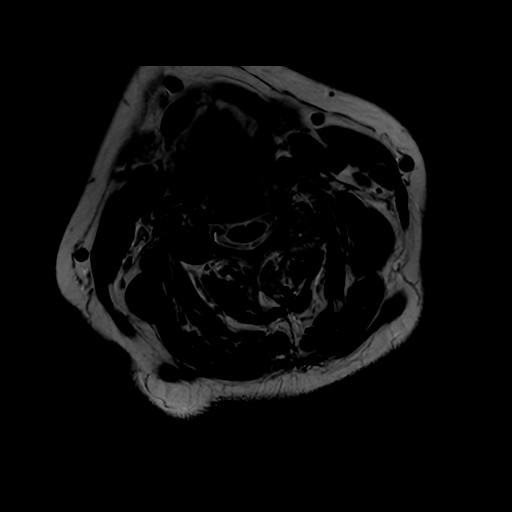
[im 26/41]
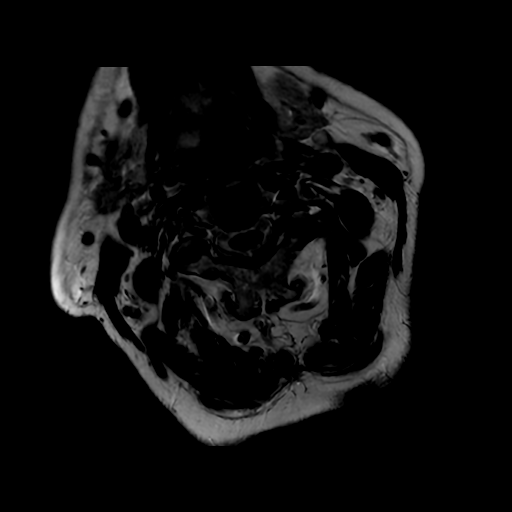
[im 31/41]
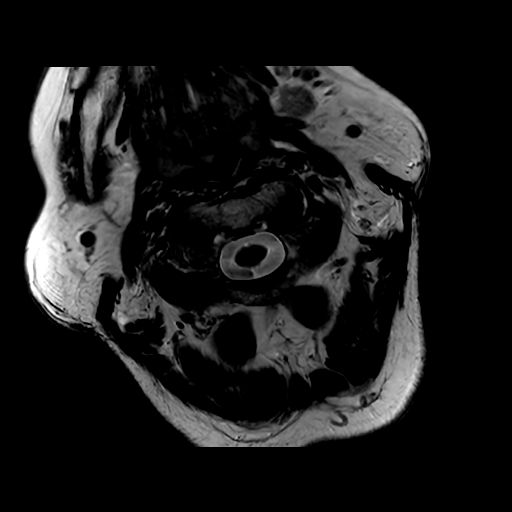
[im 36/41]
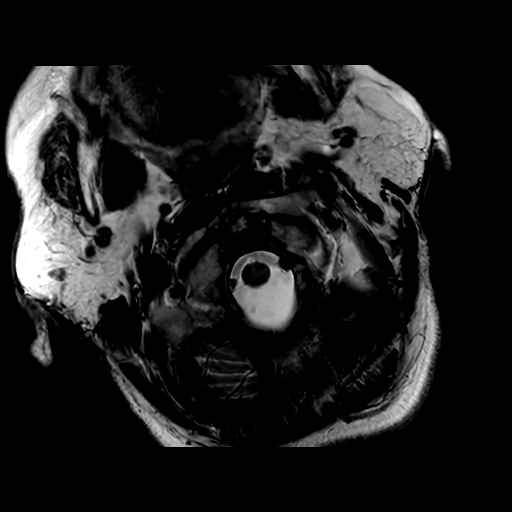
[im 41/41]
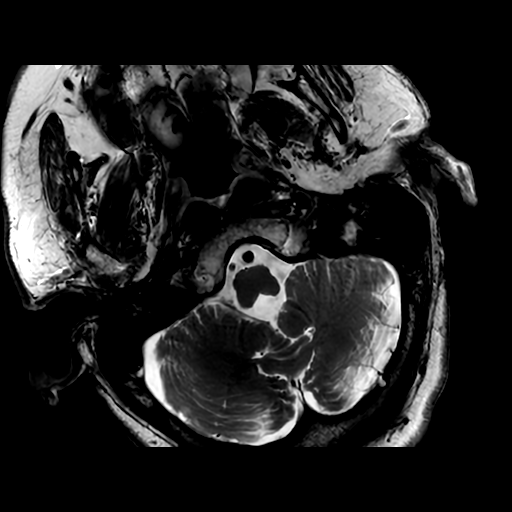

[18 of 48 positions shown; findings below may reference images not displayed]

FINDINGS: MRI CERVICAL SPINE FINDINGS

Evaluation is somewhat limited by motion artifact.

Alignment: Physiologic.

Vertebrae: No fracture, evidence of discitis, or bone lesion.

Cord: Normal signal and morphology.

Posterior Fossa, vertebral arteries, paraspinal tissues: Please see
same day MRI for posterior fossa findings and same-day MRA for
vertebral artery findings. The paraspinal tissues are normal.

Disc levels:

C2-C3: Minimal disc bulge. Facet arthropathy. No spinal canal
stenosis or neural foraminal narrowing.

C3-C4: No significant disc bulge. No spinal canal stenosis.
Left-greater-than-right facet arthropathy. Uncovertebral
hypertrophy. Mild right neural foraminal narrowing.

C4-C5: Right eccentric disc bulge. Right greater than left
uncovertebral hypertrophy. Facet arthropathy. No spinal canal
stenosis. Moderate right and mild left neural foraminal narrowing.

C5-C6: Mild right eccentric disc bulge, which abuts the ventral
cord. Mild spinal canal stenosis. Uncovertebral and facet
arthropathy. Severe right and mild left neural foraminal narrowing.

C6-C7: Broad-based disc bulge. No spinal canal stenosis.
Uncovertebral and facet arthropathy. Severe left and moderate to
severe right neural foraminal narrowing.

C7-T1: No significant disc bulge. No spinal canal stenosis or
neuroforaminal narrowing.

MRI THORACIC SPINE FINDINGS

Alignment:  Physiologic.

Vertebrae: No acute fracture or suspicious osseous lesion.

Cord:  Normal signal and morphology.

Paraspinal and other soft tissues: Negative.

Disc levels:

No spinal canal stenosis. No significant neural foraminal narrowing.
Some small disc bulges and facet arthropathy.
IMPRESSION: 1. C5-C6 mild spinal canal stenosis, with severe right and mild left
neural foraminal narrowing.
2. C6-C7 severe left and moderate to severe right neural foraminal
narrowing.
3. C4-C5 moderate right and mild left neural foraminal narrowing.
4. No significant spinal canal stenosis or neural foraminal
narrowing in the thoracic spine.

## 2021-01-17 IMAGING — MR MR MRA HEAD W/O CM
2 series · 18 of 48 positions shown · non-contrast
Comparison: MRI brain [DATE], correlation is also made with CTA
head neck [DATE].

CLINICAL DATA: Neuro deficit, stroke suspected

EXAM:
MRA NECK WITHOUT CONTRAST
MRA HEAD WITHOUT CONTRAST
TECHNIQUE: Angiographic images of the Circle of Willis were acquired using MRA
technique without intravenous contrast.

[Series 3: ax (id) · axial · 1.0mm · 0.43mm/px · z∈[-54,+29]mm · 17 of 176 slices shown]
[im 1/176]
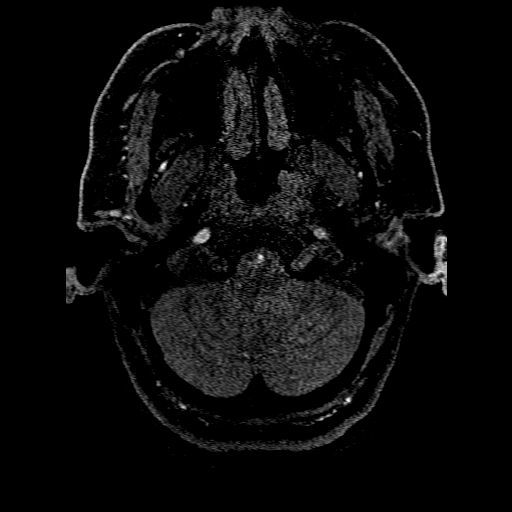
[im 4/176]
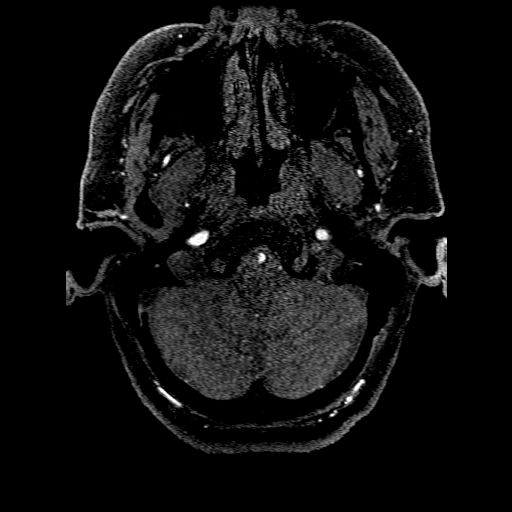
[im 8/176]
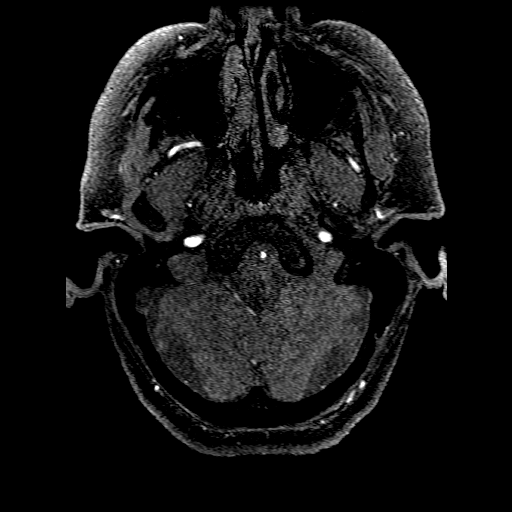
[im 12/176]
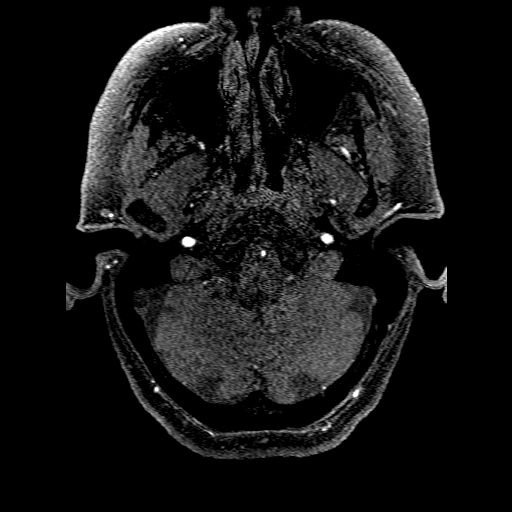
[im 16/176]
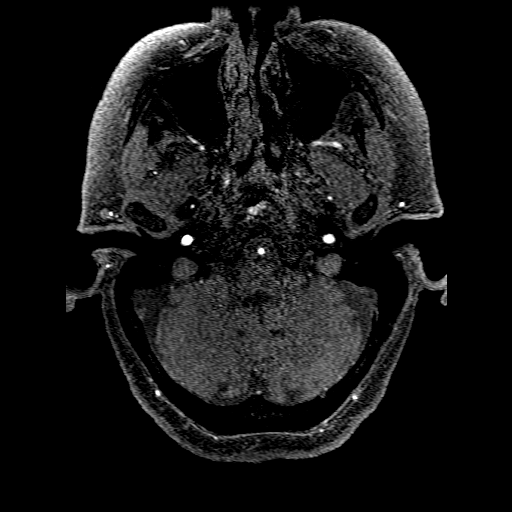
[im 20/176]
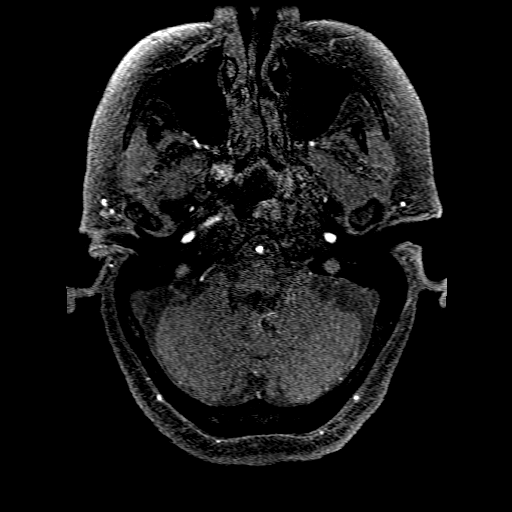
[im 23/176]
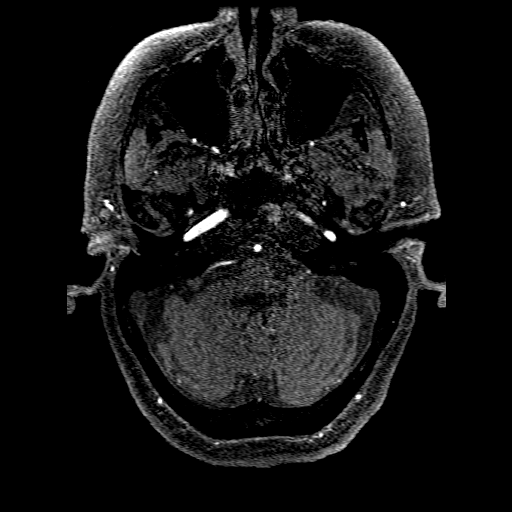
[im 27/176]
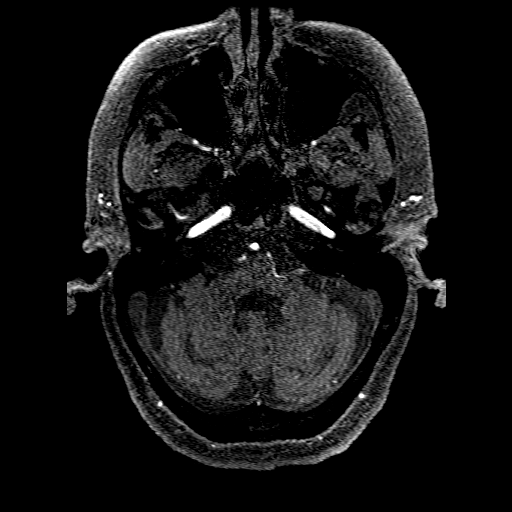
[im 31/176]
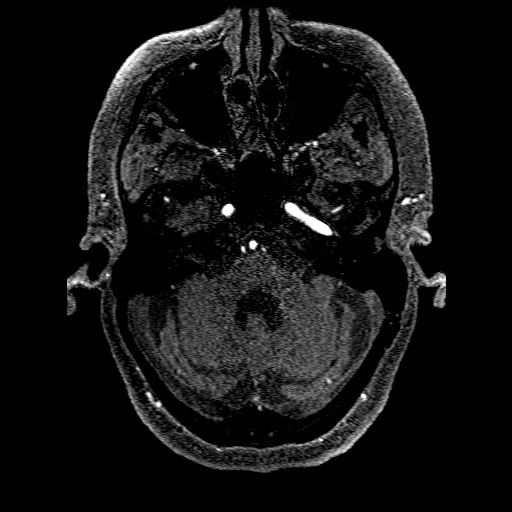
[im 54/176]
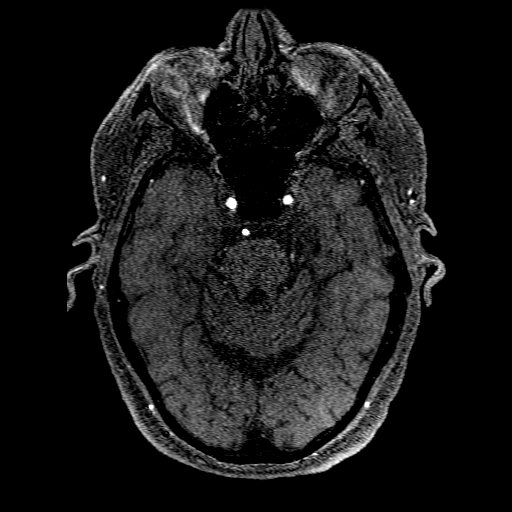
[im 77/176]
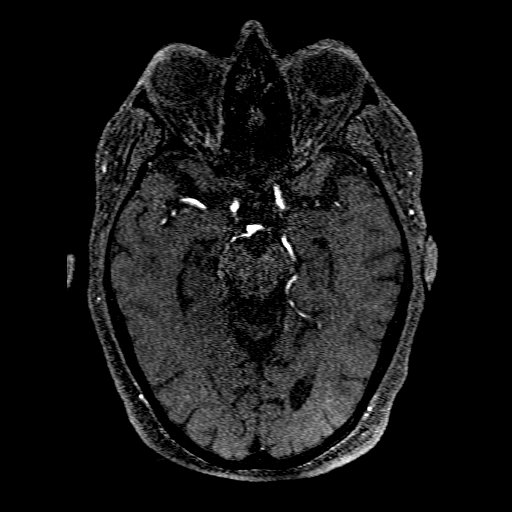
[im 88/176]
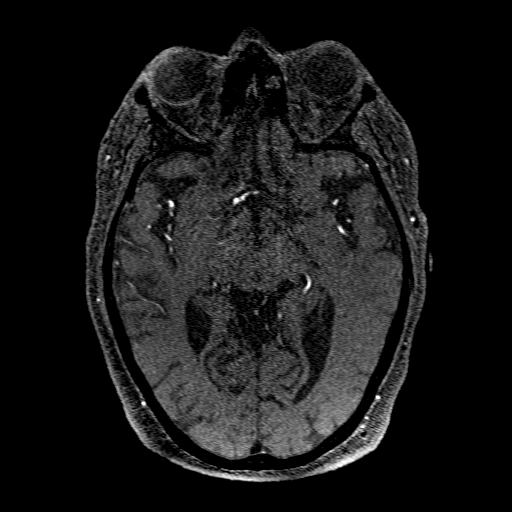
[im 99/176]
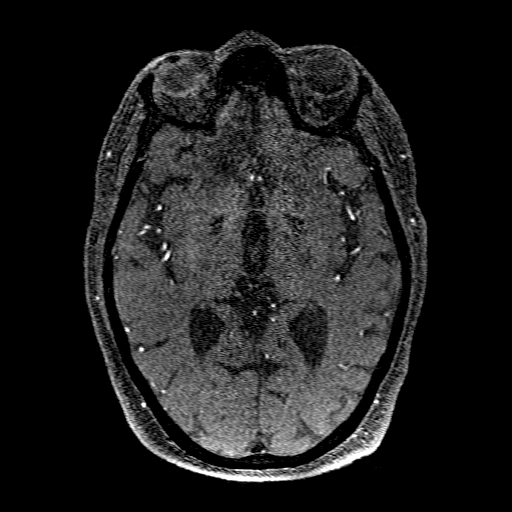
[im 122/176]
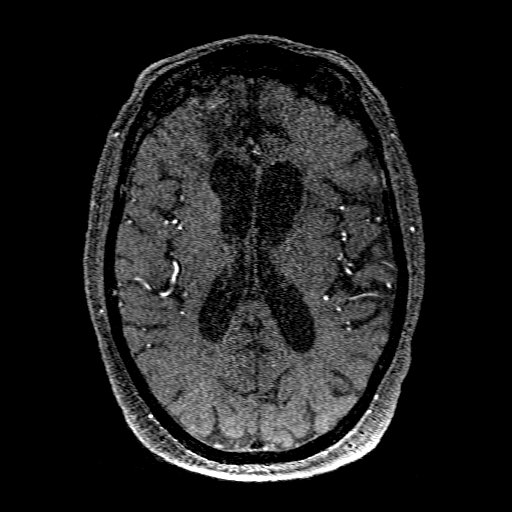
[im 145/176]
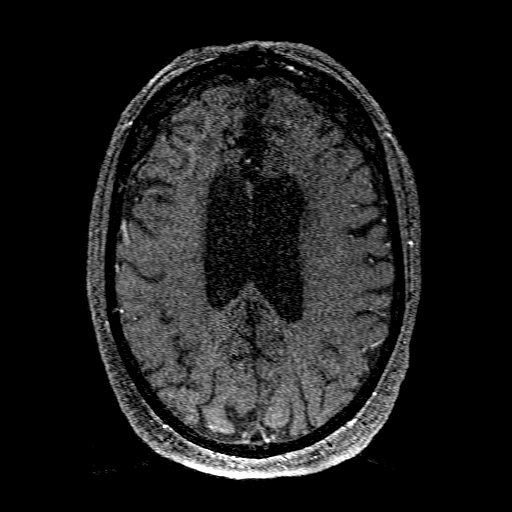
[im 149/176]
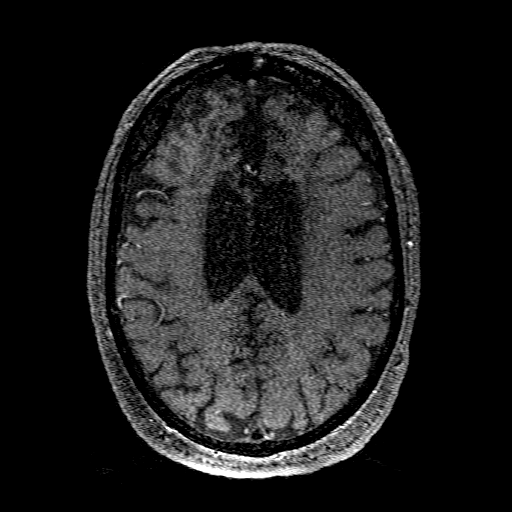
[im 168/176]
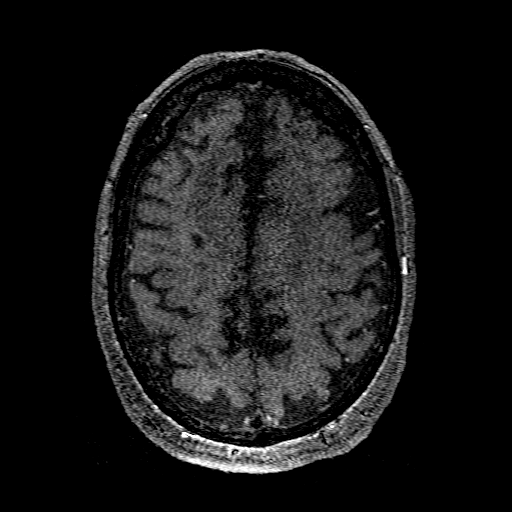

[Series 301: pjn:ax (id) · sagittal · 1.0mm · 0.43mm/px · 1 of 5 slices shown]
[im 1/5]
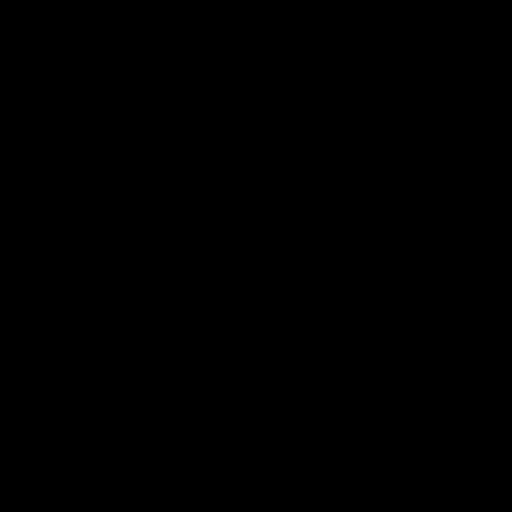

[18 of 48 positions shown; findings below may reference images not displayed]

FINDINGS: MRA NECK FINDINGS

Standard aortic branching. No acute dissection or aneurysm in the
imaged portion.

The bilateral common and internal carotid arteries are patent,
without significant stenosis.

The left vertebral artery is patent from its origin to the
vertebrobasilar junction. The right vertebral artery is poorly
visualized at its origin, possibly due to artifact; the right V2 and
V3 segments are diminutive. The right V4 segment does not
demonstrate flow related signal, likely unchanged from the prior
CTA.

MRA HEAD FINDINGS

Both internal carotid arteries are patent to the termini, with
multifocal irregularity in the carotid siphons, likely atheromatous
disease. The left A1 segment is not visualized. The right A1 segment
is irregular with a focal area of narrowing proximal to the anterior
communicating artery (series 3, image 89). Normal anterior
communicating artery. Focal narrowing in the proximal right A2
segment (series 3, image 91). More distal ACA segments demonstrate
poor flow and multifocal narrowing. Multifocal narrowing in the
bilateral M1 segments, most prominent in the right M1. Normal MCA
bifurcations. Distal MCA branches demonstrate multifocal narrowing
but appear perfused to their distal aspects.

No flow is seen in the intracranial right vertebral artery. The left
vertebral artery is patent to the vertebrobasilar junction.
Multifocal narrowing of the basilar artery. Superior cerebellar
arteries are patent bilaterally. Poor flow is seen in the right PCA,
with narrowing of the right P1 (series 3, image 84) and poor
visualization of the remainder of the right PCA. Focal narrowing of
the proximal left P2 segment (series 3, image 81), with a patent but
irregular left PCA.
IMPRESSION: 1. Severe multifocal narrowing of the right PCA and bilateral ACA.
Less significant irregularity is seen the bilateral MCAs, basilar
artery, and left PCA. No large vessel occlusion.
2. No hemodynamically significant stenosis in the neck.
Redemonstrated diminutive right vertebral artery, with absence of
flow in the V4 segment, which is not significantly different from
the [DATE] CTA.

## 2021-01-17 MED ORDER — ASPIRIN 325 MG PO TABS
325.0000 mg | ORAL_TABLET | Freq: Once | ORAL | Status: AC
  Administered 2021-01-17: 325 mg via ORAL
  Filled 2021-01-17: qty 1

## 2021-01-17 MED ORDER — ENOXAPARIN SODIUM 30 MG/0.3ML IJ SOSY
30.0000 mg | PREFILLED_SYRINGE | INTRAMUSCULAR | Status: DC
  Administered 2021-01-17 – 2021-01-18 (×2): 30 mg via SUBCUTANEOUS
  Filled 2021-01-17 (×2): qty 0.3

## 2021-01-17 MED ORDER — STROKE: EARLY STAGES OF RECOVERY BOOK
Freq: Once | Status: AC
  Filled 2021-01-17: qty 1

## 2021-01-17 MED ORDER — FAMOTIDINE 20 MG PO TABS
20.0000 mg | ORAL_TABLET | Freq: Every day | ORAL | Status: DC
  Administered 2021-01-18 – 2021-01-19 (×2): 20 mg via ORAL
  Filled 2021-01-17 (×2): qty 1

## 2021-01-17 MED ORDER — ASPIRIN EC 81 MG PO TBEC: 81.0000 mg | DELAYED_RELEASE_TABLET | Freq: Every day | ORAL | Status: DC

## 2021-01-17 MED ORDER — SODIUM CHLORIDE 0.9 % IV SOLN: INTRAVENOUS | Status: DC

## 2021-01-17 MED ORDER — CLOPIDOGREL BISULFATE 75 MG PO TABS
75.0000 mg | ORAL_TABLET | Freq: Every day | ORAL | Status: DC
  Administered 2021-01-18 – 2021-01-19 (×2): 75 mg via ORAL
  Filled 2021-01-17 (×2): qty 1

## 2021-01-17 MED ORDER — GABAPENTIN 100 MG PO CAPS: 200.0000 mg | ORAL_CAPSULE | Freq: Three times a day (TID) | ORAL | Status: DC | PRN

## 2021-01-17 MED ORDER — ATORVASTATIN CALCIUM 80 MG PO TABS
80.0000 mg | ORAL_TABLET | Freq: Every day | ORAL | Status: DC
  Administered 2021-01-18 – 2021-01-19 (×2): 80 mg via ORAL
  Filled 2021-01-17 (×2): qty 1

## 2021-01-17 MED ORDER — GUAIFENESIN 100 MG/5ML PO LIQD
5.0000 mL | ORAL | Status: DC | PRN
  Administered 2021-01-17 – 2021-01-18 (×2): 5 mL via ORAL
  Filled 2021-01-17 (×2): qty 5

## 2021-01-17 MED ORDER — SODIUM CHLORIDE 0.9 % IV BOLUS
500.0000 mL | Freq: Once | INTRAVENOUS | Status: AC
  Administered 2021-01-17: 500 mL via INTRAVENOUS

## 2021-01-17 MED ORDER — BUPROPION HCL ER (XL) 300 MG PO TB24
300.0000 mg | ORAL_TABLET | Freq: Every day | ORAL | Status: DC
  Administered 2021-01-17 – 2021-01-18 (×2): 300 mg via ORAL
  Filled 2021-01-17 (×3): qty 1

## 2021-01-17 MED ORDER — CLOPIDOGREL BISULFATE 300 MG PO TABS
300.0000 mg | ORAL_TABLET | Freq: Once | ORAL | Status: AC
  Administered 2021-01-17: 300 mg via ORAL
  Filled 2021-01-17: qty 1

## 2021-01-17 MED ORDER — PANTOPRAZOLE SODIUM 40 MG PO TBEC
80.0000 mg | DELAYED_RELEASE_TABLET | Freq: Every day | ORAL | Status: DC
  Administered 2021-01-18 – 2021-01-19 (×2): 80 mg via ORAL
  Filled 2021-01-17 (×2): qty 2

## 2021-01-17 MED ORDER — SERTRALINE HCL 50 MG PO TABS: 150.0000 mg | ORAL_TABLET | Freq: Every day | ORAL | Status: DC

## 2021-01-17 NOTE — ED Notes (Signed)
Patient transported to MRI 

## 2021-01-17 NOTE — Consult Note (Signed)
Neurology Consultation  Reason for Consult: MRI brain with subcentimeter acute left anterior frontal lobe infarction Referring Physician: Dr. Melina Copa  CC: Bilateral lower extremity weakness with incoordinated gait  History is obtained from: Patient, Chart review  HPI: Ariel [name] is a 75 y.o. female with a medical history significant for essential hypertension, multiple previous strokes, CAD, former tobacco use, medication non-adherence, debility, and type 2 diabetes mellitus who presented to the ED 11/14 for evaluation of bilateral lower extremity weakness. She states that she went to bed in her usual state of health at 22:00 last night and when she woke up at 06:00 to go to the restroom, she noticed that her legs were weak and her gait was incoordinated. She states that she slowly walked to the restroom with her cane and holding onto doorways and was able to use the restroom and slowly walk back to the bed before sending a text message to her niece explaining that she thinks she had a stroke. Her niece then activated EMS for transport to the hospital.  Of note, Ariel [name] was seen at [facility] in June of 2022 for evaluation of left-sided weakness with non-adherence to medications since previous strokes and was found to have multiple scattered small punctate infarcts bilaterally felt to be cardioembolic in nature. At discharge and follow-up with outpatient neurology, patient reported residual left-sided weakness and gait impairment. Patient went evaluation of a 30-day cardiac monitor without evidence of atrial fibrillation and was taking aspirin and atorvastatin at home.   At baseline, Ariel [name] lives with her niece who helps her with transportation to appointments and she typically walks with a rolling walker or cane in the home. Ariel [name] states that she manages her own medications and completes her ADLs independently.    LKW: 11/13 22:00 TNK given?: no, patient is outside of thrombolytic  therapy window at presentation IR Thrombectomy? No, presentation is not consistent with LVO Modified Rankin Scale: 3-Moderate disability-requires help but walks WITHOUT assistance  ROS: A complete ROS was performed and is negative except as noted in the HPI.   Past Medical History:  Diagnosis Date   Hypertension    Stroke [facility])    Past Surgical History:  Procedure Laterality Date   TUBAL LIGATION     Performed at the age of 75 years old   Family History  Problem Relation Age of Onset   COPD Mother    Heart disease Father    CVA Maternal Grandmother    Heart disease Paternal Grandmother    CVA Paternal Grandfather    Social History:   reports that she has never smoked. She has never used smokeless tobacco. She reports that she does not drink alcohol and does not use drugs.  Medications No current facility-administered medications for this encounter.  Current Outpatient Medications:    amLODipine (NORVASC) 10 MG tablet, Take 10 mg by mouth at bedtime., Disp: , Rfl:    aspirin 325 MG tablet, Take 1 tablet (325 mg total) by mouth daily., Disp: 30 tablet, Rfl: 1   atorvastatin (LIPITOR) 80 MG tablet, Take 80 mg by mouth daily., Disp: , Rfl:    buPROPion (WELLBUTRIN XL) 300 MG 24 hr tablet, Take 300 mg by mouth at bedtime., Disp: , Rfl:    carvedilol (COREG) 3.125 MG tablet, Take 3.125 mg by mouth 2 (two) times daily., Disp: , Rfl:    esomeprazole (NEXIUM) 40 MG capsule, Take 40 mg by mouth daily at 12 noon., Disp: , Rfl:  famotidine (PEPCID) 20 MG tablet, Take 20 mg by mouth daily., Disp: , Rfl:    fluticasone (FLONASE) 50 MCG/ACT nasal spray, Place 1 spray into both nostrils daily as needed for allergies., Disp: , Rfl:    gabapentin (NEURONTIN) 100 MG capsule, Take 200 mg by mouth 3 (three) times daily as needed (pain)., Disp: , Rfl:    melatonin 5 MG TABS, Take 5 mg by mouth at bedtime., Disp: , Rfl:    Phenylephrine-DM-GG-APAP (DELSYM COUGH/COLD DAYTIME) 5-10-200-325 MG/10ML  LIQD, Take 10 mLs by mouth daily as needed (cough)., Disp: , Rfl:    sertraline (ZOLOFT) 100 MG tablet, Take 150 mg by mouth at bedtime., Disp: , Rfl:    sulfamethoxazole-trimethoprim (BACTRIM DS) 800-160 MG tablet, Take 1 tablet by mouth 2 (two) times daily. Start date:01/15/21, Disp: , Rfl:    atorvastatin (LIPITOR) 40 MG tablet, Take 1 tablet (40 mg total) by mouth daily. (Patient not taking: Reported on 01/17/2021), Disp: 30 tablet, Rfl: 1   fexofenadine (ALLEGRA) 180 MG tablet, Take 180 mg by mouth daily as needed for allergies., Disp: , Rfl:   Exam: Current vital signs: BP (!) 137/95   Pulse 83   Temp 98.1 F (36.7 C) (Oral)   Resp (!) 25   Ht '5\' 6"'  (1.676 m)   Wt 80.7 kg   LMP  (LMP Unknown)   SpO2 95%   BMI 28.73 kg/m  Vital signs in last 24 hours: Temp:  [98.1 F (36.7 C)] 98.1 F (36.7 C) (11/14 0815) Pulse Rate:  [50-83] 83 (11/14 1615) Resp:  [14-98] 25 (11/14 1615) BP: (127-150)/(57-131) 137/95 (11/14 1615) SpO2:  [80 %-100 %] 95 % (11/14 1615) Weight:  [80.7 kg] 80.7 kg (11/14 1018)  GENERAL: Awake, alert, in no acute distress Psych: Affect appropriate for situation, patient is calm and cooperative with examination Head: Normocephalic and atraumatic, without obvious abnormality EENT: Normal conjunctivae, dry mucous membranes, no OP obstruction LUNGS: Normal respiratory effort. Non-labored breathing on room air CV: Regular rate and rhythm on telemetry ABDOMEN: Soft, non-tender, non-distended Extremities: warm, well perfused, without obvious deformity  NEURO:  Mental Status: Awake, alert, and oriented to person, place, time, and situation. She is able to provide a clear and coherent history of present illness. Speech/Language: speech is intact without dysarthria   Naming, repetition, fluency, and comprehension intact without aphasia. No neglect is noted Cranial Nerves:  II: PERRL. Visual fields full.  III, IV, VI: EOMI without ptosis, nystagmus, or gaze  preference.  V: Sensation is intact to light touch and symmetrical to face.  VII: Face is symmetric resting and smiling.  VIII: Hearing is intact to voice IX, X: Palate elevation is symmetric. Phonation normal.  XI: Normal sternocleidomastoid and trapezius muscle strength XII: Tongue protrudes midline without fasciculations.   Motor: 5/5 strength present throughout with subtle decreased left hand rapid alternating movements and subtle left lower extremity weakness compared to the right. There is no pronator drift throughout.  Tone is normal. Bulk is normal.  Sensation: Intact to light touch bilaterally in all four extremities. Coordination: FTN intact bilaterally. HKS intact with subtle dysmetria on the left lower extremity  DTRs: 1+ and symmetric patellae, 2+ and symmetric biceps.  Gait: Apraxic appearing gait with left lower extremity weakness and unsteadiness.   NIHSS: 1a Level of Conscious.: 0 1b LOC Questions: 0 1c LOC Commands: 0 2 Best Gaze: 0 3 Visual: 0 4 Facial Palsy: 0 5a Motor Arm - left: 0 5b Motor Arm - Right: 0  6a Motor Leg - Left: 0 6b Motor Leg - Right: 0 7 Limb Ataxia: 1 8 Sensory: 0 9 Best Language: 0 10 Dysarthria: 0 11 Extinct. and Inatten.: 0 TOTAL: 1  Labs I have reviewed labs in epic and the results pertinent to this consultation are: CBC    Component Value Date/Time   WBC 8.7 01/17/2021 0832   RBC 4.18 01/17/2021 0832   HGB 11.5 (L) 01/17/2021 0832   HCT 35.5 (L) 01/17/2021 0832   PLT 351 01/17/2021 0832   MCV 84.9 01/17/2021 0832   MCH 27.5 01/17/2021 0832   MCHC 32.4 01/17/2021 0832   RDW 15.5 01/17/2021 0832   LYMPHSABS 3.7 08/27/2020 0410   MONOABS 0.5 08/27/2020 0410   EOSABS 0.1 08/27/2020 0410   BASOSABS 0.0 08/27/2020 0410   CMP     Component Value Date/Time   NA 133 (L) 01/17/2021 0832   K 4.1 01/17/2021 0832   CL 100 01/17/2021 0832   CO2 25 01/17/2021 0832   GLUCOSE 122 (H) 01/17/2021 0832   BUN 27 (H) 01/17/2021 0832    CREATININE 1.98 (H) 01/17/2021 0832   CALCIUM 9.0 01/17/2021 0832   PROT 6.5 08/27/2020 0410   ALBUMIN 3.1 (L) 08/28/2020 0245   AST 22 08/27/2020 0410   ALT 25 08/27/2020 0410   ALKPHOS 67 08/27/2020 0410   BILITOT 0.7 08/27/2020 0410   GFRNONAA 26 (L) 01/17/2021 0832   GFRAA >60 03/31/2018 1831   Lipid Panel     Component Value Date/Time   CHOL 226 (H) 08/27/2020 1223   TRIG 149 08/27/2020 1223   HDL 52 08/27/2020 1223   CHOLHDL 4.3 08/27/2020 1223   VLDL 30 08/27/2020 1223   LDLCALC 144 (H) 08/27/2020 1223   Lab Results  Component Value Date   HGBA1C 6.1 (H) 08/27/2020   Imaging I have reviewed the images obtained:  MRI examination of the brain 11/14: 1. Subcentimeter focus of restricted diffusion in the left anterior frontal lobe, consistent with acute infarct. 2. Redemonstrated sequela of prior right-greater-than-left frontal and parietal lobe infarcts. 3. Redemonstrated poor flow void in the right vertebral artery which is unchanged and correlates with poor flow in the right vertebral artery on the 08/27/2020 CTA.  Echocardiogram 08/27/2020:  1. Left ventricular ejection fraction, by estimation, is 60 to 65%. The  left ventricle has normal function. The left ventricle has no regional  wall motion abnormalities. There is mild left ventricular hypertrophy.  Left ventricular diastolic parameters  are consistent with Grade I diastolic dysfunction (impaired relaxation).   2. Right ventricular systolic function is normal. The right ventricular  size is normal. There is normal pulmonary artery systolic pressure.   3. The mitral valve is normal in structure. Mild mitral valve  regurgitation. No evidence of mitral stenosis.   4. The aortic valve is tricuspid. Aortic valve regurgitation is not  visualized. Mild aortic valve sclerosis is present, with no evidence of  aortic valve stenosis.   5. The inferior vena cava is normal in size with greater than 50%  respiratory  variability, suggesting right atrial pressure of 3 mmHg.   6. Agitated saline contrast bubble study was negative, with no evidence  of any interatrial shunt.   Assessment: 75 y.o. female with a PMHx significant for multiple previous strokes with reported residual left-sided weakness who presented to the ED for evaluation of bilateral lower extremity weakness and gait coordination. - Examination reveals patient with stable exam from previous outpatient neurology evaluation with subtle left  lower extremity weakness and decreased dexterity of the left upper extremity with an NIHSS of 1 for subtle left lower extremity dysmetria. Patient's gait is apraxic appearing with left lower extremity stiffness and some unsteadiness.  - Patient previous presentation revealed multiple scattered small infarcts felt to be 2/2 cardioembolic source. 30 day cardiac monitor complete without evidence of atrial fibrillation, no history of atrial fibrillation.  - Stroke risk factors include HTN, HLD, severe intracranial atherosclerosis, carotid stenosis, history of remote Right > left bifrontal infarcts, CAD, patient's advanced age, and former tobacco use.  - Patient's MRI finding of subcentimeter left anterior frontal lobe acute infarction is felt to be an incidental finding and is not felt to be consistent with patient complaints of bilateral lower extremity weakness. Due to history of strokes with acute stroke on imaging today, will complete stroke work up for further evaluation. Will obtain MRI thoracic and cervical spine for further evaluation of bilateral lower extremity weakness with subjective impaired ambulation. - Per outpatient neurology Ariel Rider, NP note 10/07/2020: "Gait and Station: Arises from chair without difficulty. Stance is hunched.  Broad-based gait with slow cautious steps and decreased step height with use of rolling walker. "  Recommendations: - HgbA1c, fasting lipid panel - MRA head and neck without  contrast for vessel imaging due to eGFR of 26 - MRI cervical and thoracic spine for evaluation of bilateral lower extremity weakness not explained by MRI brain findings - Frequent neuro checks - Echocardiogram completed in June 2022 - Prophylactic therapy-Antiplatelet med: Aspirin - dose 381m PO or 3028mPR followed by aspirin 81 mg PO daily with clopidogrel 300 mg once followed by 75 mg PO daily for 3 weeks. Following DAPT therapy for 3 weeks will transition to clopidogrel monotherapy. - Risk factor modification - Telemetry monitoring - PT consult, OT consult - Stroke team to follow  StAnibal HendersonAGAC-NP [facility] Pager: [phone] have seen the patient and reviewed the above note.  My suspicion is that she has a marginal gait at baseline, and though the stroke is not particularly large, she was compensating for her left leg with her right leg and now is not able to do so.  Given that she very much feels that it is bilateral symptoms, I think an MRI of her cervical and thoracic spine to rule out other causes is reasonable, but if this is negative I would focus on stroke as etiology for her sudden change.  Stroke team to follow.  McRoland RackMD [facility] [phone] 7pm- 7am, please page neurology on call as listed in [place]Washington [place]

## 2021-01-17 NOTE — ED Provider Notes (Signed)
[facility] EMERGENCY DEPARTMENT Provider Note   CSN: [id number] Arrival date & time: 01/17/21  0801     History Chief Complaint  Patient presents with   Weakness    Ariel [name] is a 75 y.o. female.  Presented to the emergency room with concern for weakness.  Reports that yesterday when she went to bed she did not have any symptoms.  When she woke up this morning she noticed that she was having a hard time walking, felt weak in both of her legs.  Feels like she is having a hard time coordinating for steps.  Denies weakness in the arms.  No numbness anywhere, no speech change or vision change.  Feels like the weakness in her legs is the same on both sides.  Has not changed since waking up this morning.  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  No A. fib history.    HPI     Past Medical History:  Diagnosis Date   Hypertension    Stroke [facility])     Patient Active Problem List   Diagnosis Date Noted   Acute left-sided weakness 08/27/2020   CVA (cerebral vascular accident) ([place]) 08/27/2020   AKI (acute kidney injury) ([place]) 08/27/2020   Benign essential HTN 08/27/2020   Anxiety and depression 08/27/2020   GERD (gastroesophageal reflux disease) 08/27/2020    Past Surgical History:  Procedure Laterality Date   TUBAL LIGATION     Performed at the age of 75 years old     OB History   No obstetric history on file.     Family History  Problem Relation Age of Onset   COPD Mother    Heart disease Father    CVA Maternal Grandmother    Heart disease Paternal Grandmother    CVA Paternal Grandfather     Social History   Tobacco Use   Smoking status: Never   Smokeless tobacco: Never  Vaping Use   Vaping Use: Never used  Substance Use Topics   Alcohol use: Never   Drug use: Never    Home Medications Prior to Admission medications   Medication Sig Start Date End Date Taking? Authorizing  Provider  amLODipine (NORVASC) 10 MG tablet Take 10 mg by mouth at bedtime. 07/01/20 07/01/21 Yes [provider]  aspirin 325 MG tablet Take 1 tablet (325 mg total) by mouth daily. 08/29/20  Yes Annita Brod, MD  atorvastatin (LIPITOR) 80 MG tablet Take 80 mg by mouth daily. 12/16/20  Yes [provider]  buPROPion (WELLBUTRIN XL) 300 MG 24 hr tablet Take 300 mg by mouth at bedtime.   Yes [provider]  carvedilol (COREG) 3.125 MG tablet Take 3.125 mg by mouth 2 (two) times daily. 01/12/21  Yes [provider]  esomeprazole (NEXIUM) 40 MG capsule Take 40 mg by mouth daily at 12 noon.   Yes [provider]  famotidine (PEPCID) 20 MG tablet Take 20 mg by mouth daily. 12/01/20  Yes [provider]  fluticasone (FLONASE) 50 MCG/ACT nasal spray Place 1 spray into both nostrils daily as needed for allergies. 11/03/13 01/17/21 Yes [provider]  gabapentin (NEURONTIN) 100 MG capsule Take 200 mg by mouth 3 (three) times daily as needed (pain). 07/01/20 07/01/21 Yes [provider]  melatonin 5 MG TABS Take 5 mg by mouth at bedtime.   Yes [provider]  Phenylephrine-DM-GG-APAP (DELSYM COUGH/COLD DAYTIME) 5-10-200-325 MG/10ML  LIQD Take 10 mLs by mouth daily as needed (cough).   Yes [provider]  sertraline (ZOLOFT) 100 MG tablet Take 150 mg by mouth at bedtime. 07/08/20 07/08/21 Yes [provider]  sulfamethoxazole-trimethoprim (BACTRIM DS) 800-160 MG tablet Take 1 tablet by mouth 2 (two) times daily. Start date:01/15/21 01/12/21 01/19/21 Yes [provider]  atorvastatin (LIPITOR) 40 MG tablet Take 1 tablet (40 mg total) by mouth daily. Patient not taking: Reported on 01/17/2021 08/29/20   Annita Brod, MD  fexofenadine (ALLEGRA) 180 MG tablet Take 180 mg by mouth daily as needed for allergies.    [provider]    Allergies    Lisinopril and Perflutren lipid  microspheres  Review of Systems   Review of Systems  Constitutional:  Negative for chills and fever.  HENT:  Negative for ear pain and sore throat.   Eyes:  Negative for pain and visual disturbance.  Respiratory:  Negative for cough and shortness of breath.   Cardiovascular:  Negative for chest pain and palpitations.  Gastrointestinal:  Negative for abdominal pain and vomiting.  Genitourinary:  Negative for dysuria and hematuria.  Musculoskeletal:  Positive for gait problem. Negative for arthralgias and back pain.  Skin:  Negative for color change and rash.  Neurological:  Positive for weakness. Negative for seizures and syncope.  All other systems reviewed and are negative.  Physical Exam Updated Vital Signs BP (!) 137/95   Pulse 83   Temp 98.1 F (36.7 C) (Oral)   Resp (!) 25   Ht 5\' 6"  (1.676 m)   Wt 80.7 kg   LMP  (LMP Unknown)   SpO2 95%   BMI 28.73 kg/m   Physical Exam Vitals and nursing note reviewed.  Constitutional:      General: She is not in acute distress.    Appearance: She is well-developed.  HENT:     Head: Normocephalic and atraumatic.  Eyes:     Conjunctiva/sclera: Conjunctivae normal.  Cardiovascular:     Rate and Rhythm: Normal rate and regular rhythm.     Heart sounds: No murmur heard. Pulmonary:     Effort: Pulmonary effort is normal. No respiratory distress.     Breath sounds: Normal breath sounds.  Abdominal:     Palpations: Abdomen is soft.     Tenderness: There is no abdominal tenderness.  Musculoskeletal:     Cervical back: Neck supple.  Skin:    General: Skin is warm and dry.  Neurological:     Mental Status: She is alert.     Comments: AAOx3 CN 2-12 intact, speech clear visual fields intact 5/5 strength in b/l UE and LE Sensation to light touch intact in b/l UE and LE Normal FNF    ED Results / Procedures / Treatments   Labs (all labs ordered are listed, but only abnormal results are displayed) Labs Reviewed  BASIC METABOLIC  PANEL - Abnormal; Notable for the following components:      Result Value   Sodium 133 (*)    Glucose, Bld 122 (*)    BUN 27 (*)    Creatinine, Ser 1.98 (*)    GFR, Estimated 26 (*)    All other components within normal limits  CBC - Abnormal; Notable for the following components:   Hemoglobin 11.5 (*)    HCT 35.5 (*)    All other components within normal limits  CBG MONITORING, ED - Abnormal; Notable for the following components:   Glucose-Capillary 126 (*)  All other components within normal limits  RESP PANEL BY RT-PCR (FLU A&B, COVID) ARPGX2  URINALYSIS, ROUTINE W REFLEX MICROSCOPIC    EKG EKG Interpretation  Date/Time:  Monday January 17 2021 08:01:04 EST Ventricular Rate:  58 PR Interval:  172 QRS Duration: 96 QT Interval:  450 QTC Calculation: 441 R Axis:   26 Text Interpretation: Sinus bradycardia Otherwise normal ECG Confirmed by Madalyn Rob [phone]) on 01/17/2021 10:12:05 AM  Radiology DG Pelvis 1-2 Views  Result Date: 01/17/2021 CLINICAL DATA:  Bilateral hip pain EXAM: PELVIS - 1 VIEW COMPARISON:  None. FINDINGS: There is no evidence of pelvic fracture or diastasis. Mild degenerative changes of the bilateral SI joints. Minimal degenerative changes of the bilateral hip joints. Soft tissues are unremarkable. IMPRESSION: No acute osseous abnormality. Electronically Signed   By: Yetta Glassman M.D.   On: 01/17/2021 09:17   CT Head Wo Contrast  Result Date: 01/17/2021 CLINICAL DATA:  Patient complains of increased bilateral leg weakness. EXAM: CT HEAD WITHOUT CONTRAST TECHNIQUE: Contiguous axial images were obtained from the base of the skull through the vertex without intravenous contrast. COMPARISON:  08/27/2020 FINDINGS: Brain: No evidence of acute infarction, hemorrhage, hydrocephalus, extra-axial collection or mass lesion/mass effect. Encephalomalacia within bilateral frontal lobes is again identified compatible with previous infarcts. Chronic lacunar  infarcts within the left basal ganglia is noted and appears unchanged. There is moderate to marked low-attenuation within the subcortical and periventricular white matter compatible with chronic microvascular disease. Vascular: No hyperdense vessel or unexpected calcification. Skull: Normal. Negative for fracture or focal lesion. Sinuses/Orbits: No acute finding. Other: None. IMPRESSION: 1. No acute intracranial abnormalities. 2. Chronic small vessel ischemic disease and brain atrophy. 3. Chronic bilateral frontal lobe and left basal ganglia infarcts. Electronically Signed   By: Kerby Moors M.D.   On: 01/17/2021 09:27   MR BRAIN WO CONTRAST  Result Date: 01/17/2021 CLINICAL DATA:  Neuro deficit, stroke suspected EXAM: MRI HEAD WITHOUT CONTRAST TECHNIQUE: Multiplanar, multiecho pulse sequences of the brain and surrounding structures were obtained without intravenous contrast. COMPARISON:  08/27/2020 CT, CTA, and MRI. FINDINGS: Brain: Subcentimeter focus of restricted diffusion in the left anterior frontal lobe (series 3, image 37 and series 4, image 32), with ADC correlate and without significant increased T2 signal. Sequela of prior right greater than left frontal lobe infarcts with associated encephalomalacia and gliosis. Lacunar infarcts in the right greater than left corona radiata. Confluent T2 hyperintense signal in the periventricular white matter, likely the sequela of severe chronic small vessel ischemic disease. No mass, mass effect, or midline shift. No acute hemorrhage. No hydrocephalus or extra-axial collection. Vascular: Loss of the flow void in right vertebral artery, unchanged from the prior exam. Otherwise normal flow voids. Skull and upper cervical spine: Normal marrow signal. Sinuses/Orbits: Minimal mucosal thickening in the ethmoid air cells. Status post bilateral lens replacements. Other: The mastoids are well aerated. IMPRESSION: 1. Subcentimeter focus of restricted diffusion in the left  anterior frontal lobe, consistent with acute infarct. 2. Redemonstrated sequela of prior right-greater-than-left frontal and parietal lobe infarcts. 3. Redemonstrated poor flow void in the right vertebral artery which is unchanged and correlates with poor flow in the right vertebral artery on the 08/27/2020 CTA. These results were called by telephone at the time of interpretation on 01/17/2021 at 2:28 pm to provider [facility] , who verbally acknowledged these results. Electronically Signed   By: Merilyn Baba M.D.   On: 01/17/2021 14:29    Procedures .Critical Care Performed by: Roslynn Amble,  Ellwood Dense, MD Authorized by: Lucrezia Starch, MD   Critical care provider statement:    Critical care time (minutes):  39   Critical care was necessary to treat or prevent imminent or life-threatening deterioration of the following conditions:  CNS failure or compromise   Critical care was time spent personally by me on the following activities:  Development of treatment plan with patient or surrogate, discussions with consultants, evaluation of patient's response to treatment, examination of patient, ordering and review of laboratory studies, ordering and review of radiographic studies, ordering and performing treatments and interventions, pulse oximetry, re-evaluation of patient's condition and review of old charts   Medications Ordered in ED Medications  sodium chloride 0.9 % bolus 500 mL (0 mLs Intravenous Stopped 01/17/21 1134)    ED Course  I have reviewed the triage vital signs and the nursing notes.  Pertinent labs & imaging results that were available during my care of the patient were reviewed by me and considered in my medical decision making (see chart for details).    MDM Rules/Calculators/A&P                           75 year old lady presents to ER with concern for bilateral lower leg weakness, difficulty coordinating the legs.  On physical exam on arrival to ER, no focal neurologic  deficits were appreciated.  Last known well was last night before going to bed.  Given she is outside tPA window and has no focal deficits on exam now, LVO screen negative, deferred stroke alert.  Obtain basic labs, CT head initially.  Noted AKI but otherwise grossly negative.  Discussed case with Dr. Leonel Ramsay who recommended checking MRI brain.  MRI noted left anterior frontal lobe acute infarct.  Leonel Ramsay recommends medicine admission, they will see as consult.  Consulted unassigned medicine admission.  Final Clinical Impression(s) / ED Diagnoses Final diagnoses:  Weakness  AKI (acute kidney injury) ([place])  Cerebrovascular accident (CVA), unspecified mechanism ([place])    Rx / DC Orders ED Discharge Orders     None        Lucrezia Starch, MD 01/17/21 [phone]

## 2021-01-17 NOTE — ED Notes (Signed)
Modified NIHSS score 0

## 2021-01-17 NOTE — ED Notes (Signed)
Pt back from MRI 

## 2021-01-17 NOTE — ED Provider Notes (Signed)
,  Emergency Medicine Provider Triage Evaluation Note  Ariel [name] , a 75 y.o. female  was evaluated in triage.  Pt complains of weakness in bilateral legs, with some lightheadedness vs dizziness when she woke up this morning at 6am. LNW last night before she went to bed. Patient has had history of multiple "ministrokes", with the last one 2-3 months ago. Does not take blood thinner.  Carotid artery stenosis, hyperlipidemia. No recent fall.  Review of Systems  Positive: Leg weakness, dizziness Negative: Facial droop, confusion, aphasia, dysarthria  Physical Exam  BP (!) 127/59   Pulse (!) 58   Temp 98.1 F (36.7 C) (Oral)   Resp (!) 98   LMP  (LMP Unknown)   SpO2 96%  Gen:   Awake, no distress  Resp:  Normal effort MSK:   Moves extremities without difficulty, some TTP over bil trochanters, left greater than right Other:  CN3-12 grossly intact, intact strength bil UE/LE, Aox4, romberg negative, some stuttering gait, hesitant steps patient states "legs feel like lead"  Medical Decision Making  Medically screening exam initiated at 8:50 AM.  Appropriate orders placed.  Ariel [name] was informed that the remainder of the evaluation will be completed by another provider, this initial triage assessment does not replace that evaluation, and the importance of remaining in the ED until their evaluation is complete.  Patient concern for stroke, however likely MSK based on initial assessment, no laterality or neuro deficit   Ariel [name] 01/17/21 0857    Ariel Muskrat, MD 01/17/21 [phone]

## 2021-01-17 NOTE — ED Triage Notes (Signed)
EMS stated, she has weakness in her legs that seems worse today.

## 2021-01-17 NOTE — ED Notes (Signed)
Patient returned from MRI.

## 2021-01-17 NOTE — ED Triage Notes (Signed)
CBG 121, No deficient.

## 2021-01-17 NOTE — H&P (Signed)
History and Physical    Ariel [name] HCW:[id number] DOB: 11/13/1945 DOA: 01/17/2021  PCP: Willeen Niece, PA Consultants:  none Patient coming from:  Home - lives with niece   Chief Complaint: leg weakness, lightheadedness.   HPI: Ariel [name] is a 75 y.o. female with medical history significant of HTN and hx of stroke with residual left sided weakness, HLD, GERD, CKD stage 3b, depression who presented to Ed with stroke like symptoms. LKW around 22:00 last night. She got up this am to use bathroom around 6:00am and started to walk really slowly and felt like her legs were going to give out.  She was very light headed. She sent a text to her niece stating that she thought she had a stroke and her niece called 53. She states her legs still feel weak.   She has history of having a stroke in June 2022 visual left-sided weakness.  At discharge she underwent a 30-day cardiac monitor without evidence of atrial fibrillation and has been on aspirin and atorvastatin.  She lives with her niece and is independent with her ADLs. Her niece does help with driving her.   She denies any fevers chills, vision changes or headaches, chest pain or shortness of breath, stomach pain, nausea vomiting diarrhea, dysuria or leg swelling.  Does have chronic lower back pain.  Denies any urinary incontinence or  Saddle paresthesias.  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  In September 2022 attempted lumbar injection but patient was unable to tolerate. She also has a chronic cough   She has a history of dysphagia with pills and has been seen recently for this with a modified barium swallow study in October 2022.   ED Course: vitals: 127/59, heart rate 58, respiratory rate 17, oxygen 96% on room air. Pertinent labs: Hgb: 11.5, BUN: 27, Creatinine: 1.98 (1.3-1.5) CT head: no acute finding MRI brain: Acute infarct in left  anterior frontal lobe.  Redemonstrated's sequelae of prior right greater than left frontal and parietal lobe infarcts.  Poor flow in right vertebral artery previously seen on CTA in 08/27/2020. In ED given 500cc bolus, neurology consulted and we were asked to admit.   Review of Systems: As per HPI; otherwise review of systems reviewed and negative.   Ambulatory Status:  Ambulates with cane    Past Medical History:  Diagnosis Date   Hypertension    Stroke [facility])     Past Surgical History:  Procedure Laterality Date   TUBAL LIGATION     Performed at the age of 75 years old    Social History   Socioeconomic History   Marital status: Widowed    Spouse name: Not on file   Number of children: Not on file   Years of education: Not on file   Highest education level: Not on file  Occupational History   Not on file  Tobacco Use   Smoking status: Never   Smokeless tobacco: Never  Vaping Use   Vaping Use: Never used  Substance and Sexual Activity   Alcohol use: Never   Drug use: Never   Sexual activity: Not on file  Other Topics Concern   Not on file  Social History Narrative   Not on file   Social Determinants of Health   Financial Resource Strain: Not on file  Food Insecurity: Not on file  Transportation Needs: Not on file  Physical Activity: Not  on file  Stress: Not on file  Social Connections: Not on file  Intimate Partner Violence: Not on file    Allergies  Allergen Reactions   Lisinopril     Other reaction(s): Cough   Perflutren Lipid Microspheres     Other reaction(s): Abdominal Pain, Muscle Pain    Family History  Problem Relation Age of Onset   COPD Mother    Heart disease Father    CVA Maternal Grandmother    Heart disease Paternal Grandmother    CVA Paternal Grandfather     Prior to Admission medications   Medication Sig Start Date End Date Taking? Authorizing Provider  amLODipine (NORVASC) 10 MG tablet Take 10 mg by mouth at bedtime. 07/01/20  07/01/21 Yes [provider]  aspirin 325 MG tablet Take 1 tablet (325 mg total) by mouth daily. 08/29/20  Yes Annita Brod, MD  atorvastatin (LIPITOR) 80 MG tablet Take 80 mg by mouth daily. 12/16/20  Yes [provider]  buPROPion (WELLBUTRIN XL) 300 MG 24 hr tablet Take 300 mg by mouth at bedtime.   Yes [provider]  carvedilol (COREG) 3.125 MG tablet Take 3.125 mg by mouth 2 (two) times daily. 01/12/21  Yes [provider]  esomeprazole (NEXIUM) 40 MG capsule Take 40 mg by mouth daily at 12 noon.   Yes [provider]  famotidine (PEPCID) 20 MG tablet Take 20 mg by mouth daily. 12/01/20  Yes [provider]  fluticasone (FLONASE) 50 MCG/ACT nasal spray Place 1 spray into both nostrils daily as needed for allergies. 11/03/13 01/17/21 Yes [provider]  gabapentin (NEURONTIN) 100 MG capsule Take 200 mg by mouth 3 (three) times daily as needed (pain). 07/01/20 07/01/21 Yes [provider]  melatonin 5 MG TABS Take 5 mg by mouth at bedtime.   Yes [provider]  Phenylephrine-DM-GG-APAP (DELSYM COUGH/COLD DAYTIME) 5-10-200-325 MG/10ML LIQD Take 10 mLs by mouth daily as needed (cough).   Yes [provider]  sertraline (ZOLOFT) 100 MG tablet Take 150 mg by mouth at bedtime. 07/08/20 07/08/21 Yes [provider]  sulfamethoxazole-trimethoprim (BACTRIM DS) 800-160 MG tablet Take 1 tablet by mouth 2 (two) times daily. Start date:01/15/21 01/12/21 01/19/21 Yes [provider]  atorvastatin (LIPITOR) 40 MG tablet Take 1 tablet (40 mg total) by mouth daily. Patient not taking: Reported on 01/17/2021 08/29/20   Annita Brod, MD  fexofenadine (ALLEGRA) 180 MG tablet Take 180 mg by mouth daily as needed for allergies.    [provider]    Physical Exam: Vitals:   01/17/21 1130 01/17/21 1230 01/17/21 1300 01/17/21 1615  BP: (!) 150/67 (!) 144/131 138/60 (!) 137/95  Pulse: 68 68 68 83   Resp: 17 (!) 22 16 (!) 25  Temp:      TempSrc:      SpO2: 98% 98% 99% 95%  Weight:      Height:         General:  Appears calm and comfortable and is in NAD Eyes:  PERRL, EOMI, normal lids, iris ENT:  grossly normal hearing, lips & tongue, mmm; appropriate dentition Neck:  no LAD, masses or thyromegaly; no carotid bruits Cardiovascular:  RRR, no m/r/g. No LE edema.  Respiratory:   CTA bilaterally with no wheezes/rales/rhonchi.  Normal respiratory effort. Abdomen:  soft, NT, ND, NABS Back:   normal alignment, no CVAT Skin:  no rash or induration seen on limited exam. Sebaceous cyst on left posterior neck  Musculoskeletal:  grossly  normal tone BUE/BLE, good ROM, no bony abnormality Lower extremity:  No LE edema.  Limited foot exam with no ulcerations.  2+ distal pulses. Psychiatric:  grossly normal mood and affect, speech fluent and appropriate, AOx3 Neurologic:  CN 2-12 grossly intact, moves all extremities in coordinated fashion, sensation intact. Heel to shin intact. FTN intact bilaterally, however, had more ataxia with left FTN. Negative pronator drift. DTR 2+. Gait: shuffling gait.     Radiological Exams on Admission: Independently reviewed - see discussion in A/P where applicable  DG Pelvis 1-2 Views  Result Date: 01/17/2021 CLINICAL DATA:  Bilateral hip pain EXAM: PELVIS - 1 VIEW COMPARISON:  None. FINDINGS: There is no evidence of pelvic fracture or diastasis. Mild degenerative changes of the bilateral SI joints. Minimal degenerative changes of the bilateral hip joints. Soft tissues are unremarkable. IMPRESSION: No acute osseous abnormality. Electronically Signed   By: Yetta Glassman M.D.   On: 01/17/2021 09:17   CT Head Wo Contrast  Result Date: 01/17/2021 CLINICAL DATA:  Patient complains of increased bilateral leg weakness. EXAM: CT HEAD WITHOUT CONTRAST TECHNIQUE: Contiguous axial images were obtained from the base of the skull through the vertex without intravenous  contrast. COMPARISON:  08/27/2020 FINDINGS: Brain: No evidence of acute infarction, hemorrhage, hydrocephalus, extra-axial collection or mass lesion/mass effect. Encephalomalacia within bilateral frontal lobes is again identified compatible with previous infarcts. Chronic lacunar infarcts within the left basal ganglia is noted and appears unchanged. There is moderate to marked low-attenuation within the subcortical and periventricular white matter compatible with chronic microvascular disease. Vascular: No hyperdense vessel or unexpected calcification. Skull: Normal. Negative for fracture or focal lesion. Sinuses/Orbits: No acute finding. Other: None. IMPRESSION: 1. No acute intracranial abnormalities. 2. Chronic small vessel ischemic disease and brain atrophy. 3. Chronic bilateral frontal lobe and left basal ganglia infarcts. Electronically Signed   By: Kerby Moors M.D.   On: 01/17/2021 09:27   MR BRAIN WO CONTRAST  Result Date: 01/17/2021 CLINICAL DATA:  Neuro deficit, stroke suspected EXAM: MRI HEAD WITHOUT CONTRAST TECHNIQUE: Multiplanar, multiecho pulse sequences of the brain and surrounding structures were obtained without intravenous contrast. COMPARISON:  08/27/2020 CT, CTA, and MRI. FINDINGS: Brain: Subcentimeter focus of restricted diffusion in the left anterior frontal lobe (series 3, image 37 and series 4, image 32), with ADC correlate and without significant increased T2 signal. Sequela of prior right greater than left frontal lobe infarcts with associated encephalomalacia and gliosis. Lacunar infarcts in the right greater than left corona radiata. Confluent T2 hyperintense signal in the periventricular white matter, likely the sequela of severe chronic small vessel ischemic disease. No mass, mass effect, or midline shift. No acute hemorrhage. No hydrocephalus or extra-axial collection. Vascular: Loss of the flow void in right vertebral artery, unchanged from the prior exam. Otherwise normal  flow voids. Skull and upper cervical spine: Normal marrow signal. Sinuses/Orbits: Minimal mucosal thickening in the ethmoid air cells. Status post bilateral lens replacements. Other: The mastoids are well aerated. IMPRESSION: 1. Subcentimeter focus of restricted diffusion in the left anterior frontal lobe, consistent with acute infarct. 2. Redemonstrated sequela of prior right-greater-than-left frontal and parietal lobe infarcts. 3. Redemonstrated poor flow void in the right vertebral artery which is unchanged and correlates with poor flow in the right vertebral artery on the 08/27/2020 CTA. These results were called by telephone at the time of interpretation on 01/17/2021 at 2:28 pm to provider [place] , who verbally acknowledged these results. Electronically Signed   By: Francetta Found.D.  On: 01/17/2021 14:29    EKG: Independently reviewed.  Sinus bradycardia with rate 58; nonspecific ST changes with no evidence of acute ischemia   Labs on Admission: I have personally reviewed the available labs and imaging studies at the time of the admission.  Pertinent labs:  Hgb: 11.5 BUN: 27 Creatinine: 1.98 (1.3-1.5)    Assessment/Plan Active Problems:   Acute CVA (cerebral vascular accident) [facility]) -75 year old female with history of prior strokes, hypertension, chronic kidney disease.  Presenting with gait instability and lower leg weakness found to have an acute stroke in left anterior frontal lobe. --place in observation on telemetry for stroke work-up -neurology feels like leg weakness not related to stroke -Neurochecks per protocol -Neurology consulted -MRI brain without contrast done - MRA brain/neck and labs pending  -echo done 08/2020 -Continue daily aspirin 81 mg and start Plavix 75 mg x 3 weeks then transition to plavix only per neurology recommendation -Permissive hypertension first 24 hours <220/110 -N.p.o. until bedside swallow screen -PT/ OT consult  Bilateral leg  weakness -thought not to be due to acute CVA -MRI cervical/thoracic ordered by neurology -PT ordered  -also will check thyroid and B12 -Followed by orthopedic surgery at [facility]  for spondylosis of lumbar region with failed lumbar injections in September 2022 due to patient unable to tolerate.    Acute renal failure superimposed on stage 3b chronic kidney disease (HCC) -UA with no abnormalities -light IVF overnight -hold nephrotoxic drugs (MAR not done, but appears has been on bactrim?)  -repeat bmp in AM.     Benign essential HTN -allow for permissive HTN -hold norvasc    Anxiety and depression -continue zoloft and wellbutrin     GERD (gastroesophageal reflux disease) -continue nexium (PPI)/pepcid.   Inflammed sebaceous cyst Doesn't appear infected. Was put on bactrim, but this is held. Would recommend keflex if needs abx.  -if continues to bother her would recommend excision.   MAR done off recent office visit   Body mass index is 28.73 kg/m.   Level of care: Telemetry Medical DVT prophylaxis:  Lovenox  Code Status:  Full - confirmed with patient Family Communication: None present  Disposition Plan:  The patient is from: home  Anticipated d/c is to: home without [facility] services once her cardiology issues have been resolved.  Placed in observation for acute stroke work up, AKI, leg weakness with anticipation of less than 2 midnight stay that requires hospital stay for work-up, close monitoring, IV fluids and MDM with other specialists.   Patient is currently: stable  Consults called: neurology  Admission status:  observation   Dragon dictation used in completing this note.    Orma Flaming MD [facility]   How to contact the [facility] Attending or Consulting provider [place] or covering provider during after hours [place], for this patient?  Check the care team in [facility] and look for a) attending/consulting [facility] provider listed and b) the [facility] team listed Log into  [email or web address] and use [place]'s universal password to access. If you do not have the password, please contact the hospital operator. Locate the [facility] provider you are looking for under [facility] and page to a number that you can be directly reached. If you still have difficulty reaching the provider, please page the [facility] (Director on Call) for the Hospitalists listed on amion for assistance.   01/17/2021, 5:31 PM

## 2021-01-18 DIAGNOSIS — N179 Acute kidney failure, unspecified: Secondary | ICD-10-CM

## 2021-01-18 DIAGNOSIS — I63522 Cerebral infarction due to unspecified occlusion or stenosis of left anterior cerebral artery: Secondary | ICD-10-CM

## 2021-01-18 DIAGNOSIS — I639 Cerebral infarction, unspecified: Secondary | ICD-10-CM | POA: Diagnosis not present

## 2021-01-18 LAB — BASIC METABOLIC PANEL
Anion gap: 7 (ref 5–15)
BUN: 26 mg/dL — ABNORMAL HIGH (ref 8–23)
CO2: 23 mmol/L (ref 22–32)
Calcium: 8.5 mg/dL — ABNORMAL LOW (ref 8.9–10.3)
Chloride: 106 mmol/L (ref 98–111)
Creatinine, Ser: 1.74 mg/dL — ABNORMAL HIGH (ref 0.44–1.00)
GFR, Estimated: 30 mL/min — ABNORMAL LOW (ref >60)
Glucose, Bld: 119 mg/dL — ABNORMAL HIGH (ref 70–99)
Potassium: 3.8 mmol/L (ref 3.5–5.1)
Sodium: 136 mmol/L (ref 135–145)

## 2021-01-18 LAB — CBC
HCT: 34.1 % — ABNORMAL LOW (ref 36.0–46.0)
Hemoglobin: 10.9 g/dL — ABNORMAL LOW (ref 12.0–15.0)
MCH: 26.8 pg (ref 26.0–34.0)
MCHC: 32 g/dL (ref 30.0–36.0)
MCV: 84 fL (ref 80.0–100.0)
Platelets: 297 10*3/uL (ref 150–400)
RBC: 4.06 MIL/uL (ref 3.87–5.11)
RDW: 15.8 % — ABNORMAL HIGH (ref 11.5–15.5)
WBC: 7.7 10*3/uL (ref 4.0–10.5)
nRBC: 0 % (ref 0.0–0.2)

## 2021-01-18 MED ORDER — BISACODYL 5 MG PO TBEC
5.0000 mg | DELAYED_RELEASE_TABLET | Freq: Every day | ORAL | Status: DC | PRN
  Filled 2021-01-18: qty 1

## 2021-01-18 MED ORDER — LACTATED RINGERS IV SOLN: INTRAVENOUS | Status: AC

## 2021-01-18 MED ORDER — ASPIRIN EC 325 MG PO TBEC: 325.0000 mg | DELAYED_RELEASE_TABLET | Freq: Every day | ORAL | Status: DC

## 2021-01-18 MED ORDER — CARVEDILOL 3.125 MG PO TABS: 3.1250 mg | ORAL_TABLET | Freq: Two times a day (BID) | ORAL | Status: AC

## 2021-01-18 MED ORDER — ASPIRIN 325 MG PO TABS: 325.0000 mg | ORAL_TABLET | Freq: Every day | ORAL | 0 refills | Status: DC

## 2021-01-18 MED ORDER — ASPIRIN EC 325 MG PO TBEC
325.0000 mg | DELAYED_RELEASE_TABLET | Freq: Every day | ORAL | Status: DC
  Administered 2021-01-18 – 2021-01-19 (×2): 325 mg via ORAL
  Filled 2021-01-18 (×2): qty 1

## 2021-01-18 MED ORDER — MELATONIN 5 MG PO TABS
5.0000 mg | ORAL_TABLET | Freq: Every evening | ORAL | Status: DC | PRN
  Administered 2021-01-18: 5 mg via ORAL
  Filled 2021-01-18: qty 1

## 2021-01-18 MED ORDER — DOCUSATE SODIUM 100 MG PO CAPS
100.0000 mg | ORAL_CAPSULE | Freq: Two times a day (BID) | ORAL | Status: DC
  Administered 2021-01-18 – 2021-01-19 (×3): 100 mg via ORAL
  Filled 2021-01-18 (×3): qty 1

## 2021-01-18 MED ORDER — CLOPIDOGREL BISULFATE 75 MG PO TABS: 75.0000 mg | ORAL_TABLET | Freq: Every day | ORAL | 0 refills | Status: DC

## 2021-01-18 MED ORDER — ONDANSETRON HCL 4 MG/2ML IJ SOLN: 4.0000 mg | Freq: Four times a day (QID) | INTRAMUSCULAR | Status: DC | PRN

## 2021-01-18 NOTE — Progress Notes (Signed)
MD called and stated that pt could be discharged today and have echo done as outpt if she desired.  Pt states she wants to stay to have echo done while in hospital.  MD informed.

## 2021-01-18 NOTE — Evaluation (Signed)
Physical Therapy Evaluation  Patient Details Name: Ariel [name] MRN: [id number] DOB: 26-May-1945 Today's Date: 01/18/2021  History of Present Illness  Pt is a 75 y/o female who presents with stroke like symptoms. 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 PMH significant for HTN, prior CVA with residual L sided weakness.  Clinical Impression  Pt admitted with above diagnosis. Pt currently with functional limitations due to the deficits listed below (see PT Problem List). Session limited by constipation (possible fecal impaction), and pt unable to sit on solid surface, only on toilet. She was able to ambulate around the room with intermittent assist mainly for walker management as she was running into obstacles (both on R and L). Based on performance today, anticipate pt will be safe for return home with niece's assistance, however will update recommendations as further assessment is able to be completed. Acutely, pt will benefit from skilled PT to increase their independence and safety with mobility to allow discharge to the venue listed below.        Recommendations for follow up therapy are one component of a multi-disciplinary discharge planning process, led by the attending physician.  Recommendations may be updated based on patient status, additional functional criteria and insurance authorization.  Follow Up Recommendations Home health PT    Assistance Recommended at Discharge Intermittent Supervision/Assistance  Functional Status Assessment Patient has had a recent decline in their functional status and demonstrates the ability to make significant improvements in function in a reasonable and predictable amount of time.  Equipment Recommendations  None recommended by PT    Recommendations for Other Services       Precautions / Restrictions Precautions Precautions: Fall Restrictions Weight Bearing Restrictions: No      Mobility  Bed Mobility               General  bed mobility comments: Pt was received sitting up    Transfers Overall transfer level: Needs assistance Equipment used: Rolling walker (2 wheels) Transfers: Sit to/from Stand Sit to Stand: Supervision           General transfer comment: Increased time and effort but no assist required to power-up to full stand from EOB or toilet    Ambulation/Gait Ambulation/Gait assistance: Min guard;Min assist;Supervision Gait Distance (Feet): 25 Feet Assistive device: Rolling walker (2 wheels) Gait Pattern/deviations: Step-through pattern;Decreased stride length;Trunk flexed Gait velocity: Decreased Gait velocity interpretation: <1.31 ft/sec, indicative of household ambulator   General Gait Details: Brief periods of supervision but grossly at a min guard level with occasional min assist for walker management. Pt hit the door with the walker on the R side when exiting bathroom, and hit the door with the walker on the L side when entering bathroom.  Stairs            Wheelchair Mobility    Modified Rankin (Stroke Patients Only) Modified Rankin (Stroke Patients Only) Pre-Morbid Rankin Score: Slight disability Modified Rankin: Moderately severe disability     Balance Overall balance assessment: Needs assistance Sitting-balance support: Single extremity supported;Feet supported Sitting balance-Leahy Scale: Fair     Standing balance support: Single extremity supported;During functional activity Standing balance-Leahy Scale: Poor Standing balance comment: reliant on UE support for balance                             Pertinent Vitals/Pain Pain Assessment: Faces Faces Pain Scale: Hurts little more Pain Location: Constipation    Home Living Family/patient  expects to be discharged to:: Private residence Living Arrangements: Other relatives (Niece) Available Help at Discharge: Family;Available PRN/intermittently Type of Home: House Home Access: Stairs to  enter Entrance Stairs-Rails: None Entrance Stairs-Number of Steps: 3   Home Layout: One level Home Equipment: Conservation officer, nature (2 wheels);Cane - single point;BSC/3in1      Prior Function Prior Level of Function : Independent/Modified Independent             Mobility Comments: As long as she has AD at home she is mod I. Does not drive ADLs Comments: Independent     Hand Dominance   Dominant Hand: Right    Extremity/Trunk Assessment   Upper Extremity Assessment Upper Extremity Assessment: Defer to OT evaluation    Lower Extremity Assessment Lower Extremity Assessment: Generalized weakness    Cervical / Trunk Assessment Cervical / Trunk Assessment: Other exceptions Cervical / Trunk Exceptions: forward head posture with rounded shoulders  Communication   Communication: No difficulties  Cognition Arousal/Alertness: Awake/alert Behavior During Therapy: Anxious Overall Cognitive Status: Within Functional Limits for tasks assessed                                          General Comments      Exercises     Assessment/Plan    PT Assessment Patient needs continued PT services  PT Problem List Decreased strength;Decreased activity tolerance;Decreased balance;Decreased mobility;Decreased knowledge of use of DME;Decreased safety awareness;Decreased knowledge of precautions       PT Treatment Interventions DME instruction;Gait training;Stair training;Functional mobility training;Therapeutic activities;Therapeutic exercise;Neuromuscular re-education;Patient/family education    PT Goals (Current goals can be found in the Care Plan section)  Acute Rehab PT Goals Patient Stated Goal: be able to have a BM PT Goal Formulation: With patient Time For Goal Achievement: 01/25/21 Potential to Achieve Goals: Good    Frequency Min 4X/week   Barriers to discharge        Co-evaluation               AM-PAC PT "6 Clicks" Mobility  Outcome Measure Help  needed turning from your back to your side while in a flat bed without using bedrails?: A Little Help needed moving from lying on your back to sitting on the side of a flat bed without using bedrails?: A Little Help needed moving to and from a bed to a chair (including a wheelchair)?: A Little Help needed standing up from a chair using your arms (e.g., wheelchair or bedside chair)?: A Little Help needed to walk in hospital room?: A Little Help needed climbing 3-5 steps with a railing? : A Little 6 Click Score: 18    End of Session   Activity Tolerance: Patient tolerated treatment well Patient left: with call bell/phone within reach;Other (comment) (Sitting on the toilet) Nurse Communication: Mobility status PT Visit Diagnosis: Unsteadiness on feet (R26.81);Pain;Other symptoms and signs involving the nervous system (R29.898) Pain - part of body:  (abdomen/rectum)    Time: 4259-5638 PT Time Calculation (min) (ACUTE ONLY): 31 min   Charges:   PT Evaluation $PT Eval Moderate Complexity: 1 Mod PT Treatments $Gait Training: 8-22 mins        Rolinda Roan, PT, DPT Acute Rehabilitation Services Pager: [phone] Office: [phone]   Thelma Comp 01/18/2021, 12:57 PM

## 2021-01-18 NOTE — Progress Notes (Signed)
OT Cancellation Note  Patient Details Name: Ariel [name] MRN: [id number] DOB: 18-May-1945   Cancelled Treatment:    Reason Eval/Treat Not Completed: Other (comment) (pt on toilet attempting to have BM, asked therpist to come back. OT evaluation to f/u as appropriate.)  [name] [name] 01/18/2021, 10:45 AM

## 2021-01-18 NOTE — Progress Notes (Addendum)
PROGRESS NOTE                                                                                                                                                                                                             Patient Demographics:    Ariel [name], is a 75 y.o. female, DOB - [birth year], ZRA:[id number]  Outpatient Primary MD for the patient is Willeen Niece, [place]    LOS - 0  Admit date - 01/17/2021    Chief Complaint  Patient presents with   Weakness       Brief Narrative (HPI from H&P)   Ariel [name] is a 75 y.o. female with medical history significant of HTN and hx of stroke with residual left sided weakness, HLD, GERD, CKD stage 3b, depression who presented to Ed with stroke like symptoms where she was having difficulty in walking, legs were discoordinated, weakness 1 in both lower extremities but said at times more in the left leg.  Of note she was had a stroke in June 2022 as well .   Subjective:    Ariel [name] today has, No headache, No chest pain, No abdominal pain - No Nausea, No new weakness tingling or numbness, no SOB   Assessment  & Plan :     Acute on chronic bilateral versus more on the left-side lower extremity weakness with difficulty in ambulation - in patient with previous history of stroke, with acute left anterior frontal lobe infarct, prior right-greater-than-left frontal and parietal lobe infarcts - she currently on dual antiplatelet therapy along with statin, case discussed with neurology aspirin 325 along with Plavix for 3 months thereafter Plavix only, A1c and LDL stable, stroke team is following,  Continue PT OT and finish stroke work-up.  Likely discharge later today or tomorrow with home health PT OT also ordered.  2.  Dyslipidemia.  On statin LDL at goal.  Continue to monitor.  3.  Hypertension.  Allow for permissive hypertension right now.  4.  GERD.  On PPI.  5.  Depression and  anxiety.  Continue home medications Zoloft and Wellbutrin.  6.  Flamed sebaceous cyst.  Outpatient follow-up and monitoring no acute issues.  7.  AKI on CKD 3B.  Baseline creatinine around 1.5, improved, continue to monitor with gentle hydration avoid nephrotoxins.      Condition -fair  Family Communication  : Niece updated over the phone on 01/18/2021  Code Status : Full  Consults  : Neurology  PUD Prophylaxis :     Procedures  :            Disposition Plan  :  Status is: Observation     DVT Prophylaxis  :  enoxaparin (LOVENOX) injection 30 mg Start: 01/17/21 7342   Lab Results  Component Value Date   PLT 297 01/18/2021    Diet :  Diet Order             Diet Heart Room service appropriate? Yes; Fluid consistency: Thin  Diet effective now                    Inpatient Medications  Scheduled Meds:  aspirin EC  325 mg Oral Daily   atorvastatin  80 mg Oral Daily   buPROPion  300 mg Oral QHS   clopidogrel  75 mg Oral Daily   enoxaparin (LOVENOX) injection  30 mg Subcutaneous Q24H   famotidine  20 mg Oral Daily   pantoprazole  80 mg Oral Q1200   Continuous Infusions: PRN Meds:.gabapentin, guaiFENesin  Antibiotics  :    Anti-infectives (From admission, onward)    None        Time Spent in minutes  30   Lala Lund M.D on 01/18/2021 at 10:21 AM  To page go to [email or web address]   [facility] -  Office  [phone]  See all Orders from today for further details    Objective:   Vitals:   01/17/21 2345 01/18/21 0145 01/18/21 0357 01/18/21 0751  BP: (!) 142/75 (!) 154/60 (!) 149/67 (!) 144/58  Pulse: 70 65 67 63  Resp: 20 18 18 18   Temp: 98.1 F (36.7 C) 98.1 F (36.7 C) 97.7 F (36.5 C) 97.8 F (36.6 C)  TempSrc: Oral Oral Oral Oral  SpO2: 98% 97% 97% 98%  Weight:      Height:        Wt Readings from Last 3 Encounters:  01/17/21 80.7 kg  11/23/20 81.6 kg  10/07/20 80.7 kg     Intake/Output Summary (Last 24 hours)  at 01/18/2021 1021 Last data filed at 01/18/2021 0600 Gross per 24 hour  Intake 903.75 ml  Output 1500 ml  Net -596.25 ml     Physical Exam  Awake Alert, No new F.N deficits, mild subjective left leg weakness strength on exam is 5/5 [place].AT,PERRAL Supple Neck, No JVD,   Symmetrical Chest wall movement, Good air movement bilaterally, CTAB RRR,No Gallops,Rubs or new Murmurs,  +ve B.Sounds, Abd Soft, No tenderness,   No Cyanosis, Clubbing or edema     Data Review:    CBC Recent Labs  Lab 01/17/21 0832 01/18/21 0240  WBC 8.7 7.7  HGB 11.5* 10.9*  HCT 35.5* 34.1*  PLT 351 297  MCV 84.9 84.0  MCH 27.5 26.8  MCHC 32.4 32.0  RDW 15.5 15.8*    Electrolytes Recent Labs  Lab 01/17/21 0832 01/17/21 1941 01/18/21 0240  NA 133*  --  136  K 4.1  --  3.8  CL 100  --  106  CO2 25  --  23  GLUCOSE 122*  --  119*  BUN 27*  --  26*  CREATININE 1.98*  --  1.74*  CALCIUM 9.0  --  8.5*  TSH  --  4.115  --   HGBA1C  --  5.9*  --     ------------------------------------------------------------------------------------------------------------------  Recent Labs    01/17/21 1941  CHOL 122  HDL 48  LDLCALC 57  TRIG 85  CHOLHDL 2.5    Lab Results  Component Value Date   HGBA1C 5.9 (H) 01/17/2021    Recent Labs    01/17/21 1941  TSH 4.115   ------------------------------------------------------------------------------------------------------------------ ID Labs Recent Labs  Lab 01/17/21 0832 01/18/21 0240  WBC 8.7 7.7  PLT 351 297  CREATININE 1.98* 1.74*   Cardiac Enzymes No results for input(s): CKMB, TROPONINI, MYOGLOBIN in the last 168 hours.  Invalid input(s): CK    Radiology Reports DG Pelvis 1-2 Views  Result Date: 01/17/2021 CLINICAL DATA:  Bilateral hip pain EXAM: PELVIS - 1 VIEW COMPARISON:  None. FINDINGS: There is no evidence of pelvic fracture or diastasis. Mild degenerative changes of the bilateral SI joints. Minimal degenerative changes of  the bilateral hip joints. Soft tissues are unremarkable. IMPRESSION: No acute osseous abnormality. Electronically Signed   By: Yetta Glassman M.D.   On: 01/17/2021 09:17   CT Head Wo Contrast  Result Date: 01/17/2021 CLINICAL DATA:  Patient complains of increased bilateral leg weakness. EXAM: CT HEAD WITHOUT CONTRAST TECHNIQUE: Contiguous axial images were obtained from the base of the skull through the vertex without intravenous contrast. COMPARISON:  08/27/2020 FINDINGS: Brain: No evidence of acute infarction, hemorrhage, hydrocephalus, extra-axial collection or mass lesion/mass effect. Encephalomalacia within bilateral frontal lobes is again identified compatible with previous infarcts. Chronic lacunar infarcts within the left basal ganglia is noted and appears unchanged. There is moderate to marked low-attenuation within the subcortical and periventricular white matter compatible with chronic microvascular disease. Vascular: No hyperdense vessel or unexpected calcification. Skull: Normal. Negative for fracture or focal lesion. Sinuses/Orbits: No acute finding. Other: None. IMPRESSION: 1. No acute intracranial abnormalities. 2. Chronic small vessel ischemic disease and brain atrophy. 3. Chronic bilateral frontal lobe and left basal ganglia infarcts. Electronically Signed   By: Kerby Moors M.D.   On: 01/17/2021 09:27   MR ANGIO HEAD WO CONTRAST  Result Date: 01/17/2021 CLINICAL DATA:  Neuro deficit, stroke suspected EXAM: MRA NECK WITHOUT CONTRAST MRA HEAD WITHOUT CONTRAST TECHNIQUE: Angiographic images of the Circle of Willis were acquired using MRA technique without intravenous contrast. COMPARISON:  MRI brain 01/17/2021, correlation is also made with CTA head neck 08/27/2020. FINDINGS: MRA NECK FINDINGS Standard aortic branching. No acute dissection or aneurysm in the imaged portion. The bilateral common and internal carotid arteries are patent, without significant stenosis. The left vertebral  artery is patent from its origin to the vertebrobasilar junction. The right vertebral artery is poorly visualized at its origin, possibly due to artifact; the right V2 and V3 segments are diminutive. The right V4 segment does not demonstrate flow related signal, likely unchanged from the prior CTA. MRA HEAD FINDINGS Both internal carotid arteries are patent to the termini, with multifocal irregularity in the carotid siphons, likely atheromatous disease. The left A1 segment is not visualized. The right A1 segment is irregular with a focal area of narrowing proximal to the anterior communicating artery (series 3, image 89). Normal anterior communicating artery. Focal narrowing in the proximal right A2 segment (series 3, image 91). More distal ACA segments demonstrate poor flow and multifocal narrowing. Multifocal narrowing in the bilateral M1 segments, most prominent in the right M1. Normal MCA bifurcations. Distal MCA branches demonstrate multifocal narrowing but appear perfused to their distal aspects. No flow is seen in the intracranial right vertebral artery. The left vertebral artery is patent to the vertebrobasilar junction. Multifocal  narrowing of the basilar artery. Superior cerebellar arteries are patent bilaterally. Poor flow is seen in the right PCA, with narrowing of the right P1 (series 3, image 84) and poor visualization of the remainder of the right PCA. Focal narrowing of the proximal left P2 segment (series 3, image 81), with a patent but irregular left PCA. IMPRESSION: 1. Severe multifocal narrowing of the right PCA and bilateral ACA. Less significant irregularity is seen the bilateral MCAs, basilar artery, and left PCA. No large vessel occlusion. 2. No hemodynamically significant stenosis in the neck. Redemonstrated diminutive right vertebral artery, with absence of flow in the V4 segment, which is not significantly different from the 08/27/2020 CTA. Electronically Signed   By: Merilyn Baba M.D.    On: 01/17/2021 19:23   MR ANGIO NECK WO CONTRAST  Result Date: 01/17/2021 CLINICAL DATA:  Neuro deficit, stroke suspected EXAM: MRA NECK WITHOUT CONTRAST MRA HEAD WITHOUT CONTRAST TECHNIQUE: Angiographic images of the Circle of Willis were acquired using MRA technique without intravenous contrast. COMPARISON:  MRI brain 01/17/2021, correlation is also made with CTA head neck 08/27/2020. FINDINGS: MRA NECK FINDINGS Standard aortic branching. No acute dissection or aneurysm in the imaged portion. The bilateral common and internal carotid arteries are patent, without significant stenosis. The left vertebral artery is patent from its origin to the vertebrobasilar junction. The right vertebral artery is poorly visualized at its origin, possibly due to artifact; the right V2 and V3 segments are diminutive. The right V4 segment does not demonstrate flow related signal, likely unchanged from the prior CTA. MRA HEAD FINDINGS Both internal carotid arteries are patent to the termini, with multifocal irregularity in the carotid siphons, likely atheromatous disease. The left A1 segment is not visualized. The right A1 segment is irregular with a focal area of narrowing proximal to the anterior communicating artery (series 3, image 89). Normal anterior communicating artery. Focal narrowing in the proximal right A2 segment (series 3, image 91). More distal ACA segments demonstrate poor flow and multifocal narrowing. Multifocal narrowing in the bilateral M1 segments, most prominent in the right M1. Normal MCA bifurcations. Distal MCA branches demonstrate multifocal narrowing but appear perfused to their distal aspects. No flow is seen in the intracranial right vertebral artery. The left vertebral artery is patent to the vertebrobasilar junction. Multifocal narrowing of the basilar artery. Superior cerebellar arteries are patent bilaterally. Poor flow is seen in the right PCA, with narrowing of the right P1 (series 3, image 84)  and poor visualization of the remainder of the right PCA. Focal narrowing of the proximal left P2 segment (series 3, image 81), with a patent but irregular left PCA. IMPRESSION: 1. Severe multifocal narrowing of the right PCA and bilateral ACA. Less significant irregularity is seen the bilateral MCAs, basilar artery, and left PCA. No large vessel occlusion. 2. No hemodynamically significant stenosis in the neck. Redemonstrated diminutive right vertebral artery, with absence of flow in the V4 segment, which is not significantly different from the 08/27/2020 CTA. Electronically Signed   By: Merilyn Baba M.D.   On: 01/17/2021 19:23   MR BRAIN WO CONTRAST  Result Date: 01/17/2021 CLINICAL DATA:  Neuro deficit, stroke suspected EXAM: MRI HEAD WITHOUT CONTRAST TECHNIQUE: Multiplanar, multiecho pulse sequences of the brain and surrounding structures were obtained without intravenous contrast. COMPARISON:  08/27/2020 CT, CTA, and MRI. FINDINGS: Brain: Subcentimeter focus of restricted diffusion in the left anterior frontal lobe (series 3, image 37 and series 4, image 32), with ADC correlate and without significant  increased T2 signal. Sequela of prior right greater than left frontal lobe infarcts with associated encephalomalacia and gliosis. Lacunar infarcts in the right greater than left corona radiata. Confluent T2 hyperintense signal in the periventricular white matter, likely the sequela of severe chronic small vessel ischemic disease. No mass, mass effect, or midline shift. No acute hemorrhage. No hydrocephalus or extra-axial collection. Vascular: Loss of the flow void in right vertebral artery, unchanged from the prior exam. Otherwise normal flow voids. Skull and upper cervical spine: Normal marrow signal. Sinuses/Orbits: Minimal mucosal thickening in the ethmoid air cells. Status post bilateral lens replacements. Other: The mastoids are well aerated. IMPRESSION: 1. Subcentimeter focus of restricted diffusion in  the left anterior frontal lobe, consistent with acute infarct. 2. Redemonstrated sequela of prior right-greater-than-left frontal and parietal lobe infarcts. 3. Redemonstrated poor flow void in the right vertebral artery which is unchanged and correlates with poor flow in the right vertebral artery on the 08/27/2020 CTA. These results were called by telephone at the time of interpretation on 01/17/2021 at 2:28 pm to provider [facility] , who verbally acknowledged these results. Electronically Signed   By: Merilyn Baba M.D.   On: 01/17/2021 14:29   MR CERVICAL SPINE WO CONTRAST  Result Date: 01/17/2021 CLINICAL DATA:  Back pain, bilateral lower extremity weakness, difficulty walking EXAM: MRI CERVICAL AND THORACIC SPINE WITHOUT CONTRAST TECHNIQUE: Multiplanar and multiecho pulse sequences of the cervical spine, to include the craniocervical junction and cervicothoracic junction, and the thoracic spine, were obtained without intravenous contrast. COMPARISON:  Prior MRI is not available for comparison. FINDINGS: MRI CERVICAL SPINE FINDINGS Evaluation is somewhat limited by motion artifact. Alignment: Physiologic. Vertebrae: No fracture, evidence of discitis, or bone lesion. Cord: Normal signal and morphology. Posterior Fossa, vertebral arteries, paraspinal tissues: Please see same day MRI for posterior fossa findings and same-day MRA for vertebral artery findings. The paraspinal tissues are normal. Disc levels: C2-C3: Minimal disc bulge. Facet arthropathy. No spinal canal stenosis or neural foraminal narrowing. C3-C4: No significant disc bulge. No spinal canal stenosis. Left-greater-than-right facet arthropathy. Uncovertebral hypertrophy. Mild right neural foraminal narrowing. C4-C5: Right eccentric disc bulge. Right greater than left uncovertebral hypertrophy. Facet arthropathy. No spinal canal stenosis. Moderate right and mild left neural foraminal narrowing. C5-C6: Mild right eccentric disc bulge, which  abuts the ventral cord. Mild spinal canal stenosis. Uncovertebral and facet arthropathy. Severe right and mild left neural foraminal narrowing. C6-C7: Broad-based disc bulge. No spinal canal stenosis. Uncovertebral and facet arthropathy. Severe left and moderate to severe right neural foraminal narrowing. C7-T1: No significant disc bulge. No spinal canal stenosis or neuroforaminal narrowing. MRI THORACIC SPINE FINDINGS Alignment:  Physiologic. Vertebrae: No acute fracture or suspicious osseous lesion. Cord:  Normal signal and morphology. Paraspinal and other soft tissues: Negative. Disc levels: No spinal canal stenosis. No significant neural foraminal narrowing. Some small disc bulges and facet arthropathy. IMPRESSION: 1. C5-C6 mild spinal canal stenosis, with severe right and mild left neural foraminal narrowing. 2. C6-C7 severe left and moderate to severe right neural foraminal narrowing. 3. C4-C5 moderate right and mild left neural foraminal narrowing. 4. No significant spinal canal stenosis or neural foraminal narrowing in the thoracic spine. Electronically Signed   By: Merilyn Baba M.D.   On: 01/17/2021 19:33   MR THORACIC SPINE WO CONTRAST  Result Date: 01/17/2021 CLINICAL DATA:  Back pain, bilateral lower extremity weakness, difficulty walking EXAM: MRI CERVICAL AND THORACIC SPINE WITHOUT CONTRAST TECHNIQUE: Multiplanar and multiecho pulse sequences of the cervical spine, to  include the craniocervical junction and cervicothoracic junction, and the thoracic spine, were obtained without intravenous contrast. COMPARISON:  Prior MRI is not available for comparison. FINDINGS: MRI CERVICAL SPINE FINDINGS Evaluation is somewhat limited by motion artifact. Alignment: Physiologic. Vertebrae: No fracture, evidence of discitis, or bone lesion. Cord: Normal signal and morphology. Posterior Fossa, vertebral arteries, paraspinal tissues: Please see same day MRI for posterior fossa findings and same-day MRA for  vertebral artery findings. The paraspinal tissues are normal. Disc levels: C2-C3: Minimal disc bulge. Facet arthropathy. No spinal canal stenosis or neural foraminal narrowing. C3-C4: No significant disc bulge. No spinal canal stenosis. Left-greater-than-right facet arthropathy. Uncovertebral hypertrophy. Mild right neural foraminal narrowing. C4-C5: Right eccentric disc bulge. Right greater than left uncovertebral hypertrophy. Facet arthropathy. No spinal canal stenosis. Moderate right and mild left neural foraminal narrowing. C5-C6: Mild right eccentric disc bulge, which abuts the ventral cord. Mild spinal canal stenosis. Uncovertebral and facet arthropathy. Severe right and mild left neural foraminal narrowing. C6-C7: Broad-based disc bulge. No spinal canal stenosis. Uncovertebral and facet arthropathy. Severe left and moderate to severe right neural foraminal narrowing. C7-T1: No significant disc bulge. No spinal canal stenosis or neuroforaminal narrowing. MRI THORACIC SPINE FINDINGS Alignment:  Physiologic. Vertebrae: No acute fracture or suspicious osseous lesion. Cord:  Normal signal and morphology. Paraspinal and other soft tissues: Negative. Disc levels: No spinal canal stenosis. No significant neural foraminal narrowing. Some small disc bulges and facet arthropathy. IMPRESSION: 1. C5-C6 mild spinal canal stenosis, with severe right and mild left neural foraminal narrowing. 2. C6-C7 severe left and moderate to severe right neural foraminal narrowing. 3. C4-C5 moderate right and mild left neural foraminal narrowing. 4. No significant spinal canal stenosis or neural foraminal narrowing in the thoracic spine. Electronically Signed   By: Merilyn Baba M.D.   On: 01/17/2021 19:33

## 2021-01-18 NOTE — TOC Initial Note (Signed)
Transition of Care [facility]) - Initial/Assessment Note    Patient Details  Name: Ariel [name] MRN: [id number] Date of Birth: Feb 02, 1946  [facility]) CM/SW Contact:    Pollie Friar, RN Phone Number: 01/18/2021, 3:20 PM  Clinical Narrative:                 Patient is from home with her niece. She states her niece is with her most of the time.  Pt denies issues with home medications. She says her niece provides the needed transportation. Home health ordered and pt states she used HH with [facility]) recently and would like to use them again. Levada Dy with [facility]) accepted the referral and information on the AVS. TOC following.  Expected Discharge Plan: [place] Barriers to Discharge: Continued Medical Work up   Patient Goals and CMS Choice   CMS Medicare.gov Compare Post Acute Care list provided to:: Patient Choice offered to / list presented to : Patient  Expected Discharge Plan and Services Expected Discharge Plan: [place]   Discharge Planning Services: CM Consult Post Acute Care Choice: [place] arrangements for the past 2 months: [place] Arranged: PT, OT [place] Agency: [place] Date [place]: 01/18/21   Representative spoke with at [place]: Levada Dy  Prior Living Arrangements/Services Living arrangements for the past 2 months: [place] Lives with:: Relatives (niece) Patient language and need for interpreter reviewed:: Yes Do you feel safe going back to the place where you live?: Yes      Need for Family Participation in Patient Care: Yes (Comment) Care giver support system in place?: Yes (comment) Current home services: DME (cane/ walker/ shower seat/ 3 in 1) Criminal Activity/Legal Involvement Pertinent to Current Situation/Hospitalization: No - Comment as needed  Activities of Daily Living Home Assistive  Devices/Equipment: Bedside commode/3-in-1, Blood pressure cuff, Cane (specify quad or straight), Walker (specify type), Shower chair with back ADL Screening (condition at time of admission) Patient's cognitive ability adequate to safely complete daily activities?: Yes Is the patient deaf or have difficulty hearing?: No Does the patient have difficulty seeing, even when wearing glasses/contacts?: Yes (recent cataract surgery both eyes) Does the patient have difficulty concentrating, remembering, or making decisions?: Yes (forgetful) Patient able to express need for assistance with ADLs?: Yes Does the patient have difficulty dressing or bathing?: No Independently performs ADLs?: Yes (appropriate for developmental age) Does the patient have difficulty walking or climbing stairs?: No Weakness of Legs: Both Weakness of Arms/Hands: None  Permission Sought/Granted                  Emotional Assessment Appearance:: Appears stated age Attitude/Demeanor/Rapport: Engaged Affect (typically observed): Accepting Orientation: : Oriented to Self, Oriented to Place, Oriented to  Time, Oriented to Situation   Psych Involvement: No (comment)  Admission diagnosis:  Low back pain [M54.50] Weakness [R53.1] AKI (acute kidney injury) ([place]) [N17.9] Acute CVA (cerebrovascular accident) [facility]) [I63.9] Cerebrovascular accident (CVA), unspecified mechanism ([place]) [I63.9] Patient Active Problem List   Diagnosis Date Noted   Acute CVA (cerebrovascular accident) ([place]) 01/17/2021   CVA (cerebral vascular accident) ([place]) 08/27/2020   Acute renal failure superimposed on stage 3b chronic kidney disease ([place]) 08/27/2020   Benign essential HTN 08/27/2020   Anxiety and depression 08/27/2020  GERD (gastroesophageal reflux disease) 08/27/2020   Stage 3b chronic kidney disease ([place]) 10/20/2019   PCP:  Willeen Niece, [place] Pharmacy:   [facility] [place], [place] [address] [place] [address] Phone: [phone] Fax: [phone]     Social Determinants of Health (SDOH) Interventions    Readmission Risk Interventions No flowsheet data found.

## 2021-01-18 NOTE — Care Management Obs Status (Signed)
[place] NOTIFICATION   Patient Details  Name: CARRERA KIESEL MRN: [id number] Date of Birth: 10/14/1945   Medicare Observation Status Notification Given:  Yes    Pollie Friar, RN 01/18/2021, 3:13 PM

## 2021-01-18 NOTE — Progress Notes (Addendum)
STROKE TEAM PROGRESS NOTE   ATTENDING NOTE: I reviewed above note and agree with the assessment and plan. Pt was seen and examined.   75 year old female with history of hypertension, CAD, diabetes, stroke in 08/2020 admitted for bilateral leg weakness and discoordination.  In 08/2020, patient admitted for worsening left-sided weakness.  CT no acute abnormality, chronic ACA strokes bilaterally, right much larger than the left.  CTA head and neck showed right PCA and bilateral ACAs severe stenosis, left ICA 50% stenosis.  MRI showed scattered infarcts at right ACA, MCA/ACA and one punctate left PCA infarct.  EF 60%, DVT negative, LDL 144, A1c 6.1.  Patient discharged on DAPT and Lipitor 40.  After discharge, patient follow-up with [facility], and 30-day CardioNet monitor negative for A. fib.  She had loop recorder placed on 11/23/20 with Dr. Judithe [name], and so far no afib found.  For this admission, patient stated that she woke up in the morning go to bathroom found she was not up to walk due to bilateral leg weakness, left more than right.  Of note, at baseline, she had left leg weakness due to prior stroke.  Currently, patient leg weakness much improved, no significant weakness on my exam.  CT no acute finding.  MRI showed left ACA small/punctate infarct.  MRA head and neck again showed right PCA, bilateral ICAs severe stenosis, moderate stenosis in bilateral MCA, left PCA and basilar artery.  A1c 5.9, LDL 57, B12 248, creatinine 1.74. 2D echo pending.  Etiology for patient stroke still most likely due to large vessel disease given ACA severe stenosis.  Recommend aspirin 325 and Plavix 75 DAPT for 3 months and then Plavix alone.  Continue Lipitor 80.  PT/OT recommend home health PT/OT  For detailed assessment and plan, please refer to above as I have made changes wherever appropriate.   Neurology will sign off. Please call with questions. Pt will follow up with neurology Dr. Dianna [name] at [facility] on  03/02/2021.. Thanks for the consult.  Ariel [name], Ariel [name] Stroke Neurology 01/18/2021 5:34 PM  I discussed with Dr. Candiss [name] and cardiology EP. I spent  35 minutes in total face-to-face time with the patient, more than 50% of which was spent in counseling and coordination of care, reviewing test results, images and medication, and discussing the diagnosis, treatment plan and potential prognosis. This patient's care requiresreview of multiple databases, neurological assessment, discussion with family, other specialists and medical decision making of high complexity.     INTERVAL HISTORY No one is at the bedside at time of this exam. She complains of coonstipation and bilateral lower extremity weakness.   Vitals:   01/17/21 2345 01/18/21 0145 01/18/21 0357 01/18/21 0751  BP: (!) 142/75 (!) 154/60 (!) 149/67 (!) 144/58  Pulse: 70 65 67 63  Resp: 20 18 18 18   Temp: 98.1 F (36.7 C) 98.1 F (36.7 C) 97.7 F (36.5 C) 97.8 F (36.6 C)  TempSrc: Oral Oral Oral Oral  SpO2: 98% 97% 97% 98%  Weight:      Height:       CBC:  Recent Labs  Lab 01/17/21 0832 01/18/21 0240  WBC 8.7 7.7  HGB 11.5* 10.9*  HCT 35.5* 34.1*  MCV 84.9 84.0  PLT 351 409   Basic Metabolic Panel:  Recent Labs  Lab 01/17/21 0832 01/18/21 0240  NA 133* 136  K 4.1 3.8  CL 100 106  CO2 25 23  GLUCOSE 122* 119*  BUN 27* 26*  CREATININE 1.98* 1.74*  CALCIUM 9.0 8.5*    Lipid Panel:  Recent Labs  Lab 01/17/21 1941  CHOL 122  TRIG 85  HDL 48  CHOLHDL 2.5  VLDL 17  LDLCALC 57    HgbA1c:  Recent Labs  Lab 01/17/21 1941  HGBA1C 5.9*   Urine Drug Screen: No results for input(s): LABOPIA, COCAINSCRNUR, LABBENZ, AMPHETMU, THCU, LABBARB in the last 168 hours.  Alcohol Level No results for input(s): ETH in the last 168 hours.  IMAGING past 24 hours MR ANGIO HEAD WO CONTRAST  Result Date: 01/17/2021 CLINICAL DATA:  Neuro deficit, stroke suspected EXAM: MRA NECK WITHOUT CONTRAST MRA HEAD WITHOUT  CONTRAST TECHNIQUE: Angiographic images of the Circle of Willis were acquired using MRA technique without intravenous contrast. COMPARISON:  MRI brain 01/17/2021, correlation is also made with CTA head neck 08/27/2020. FINDINGS: MRA NECK FINDINGS Standard aortic branching. No acute dissection or aneurysm in the imaged portion. The bilateral common and internal carotid arteries are patent, without significant stenosis. The left vertebral artery is patent from its origin to the vertebrobasilar junction. The right vertebral artery is poorly visualized at its origin, possibly due to artifact; the right V2 and V3 segments are diminutive. The right V4 segment does not demonstrate flow related signal, likely unchanged from the prior CTA. MRA HEAD FINDINGS Both internal carotid arteries are patent to the termini, with multifocal irregularity in the carotid siphons, likely atheromatous disease. The left A1 segment is not visualized. The right A1 segment is irregular with a focal area of narrowing proximal to the anterior communicating artery (series 3, image 89). Normal anterior communicating artery. Focal narrowing in the proximal right A2 segment (series 3, image 91). More distal ACA segments demonstrate poor flow and multifocal narrowing. Multifocal narrowing in the bilateral M1 segments, most prominent in the right M1. Normal MCA bifurcations. Distal MCA branches demonstrate multifocal narrowing but appear perfused to their distal aspects. No flow is seen in the intracranial right vertebral artery. The left vertebral artery is patent to the vertebrobasilar junction. Multifocal narrowing of the basilar artery. Superior cerebellar arteries are patent bilaterally. Poor flow is seen in the right PCA, with narrowing of the right P1 (series 3, image 84) and poor visualization of the remainder of the right PCA. Focal narrowing of the proximal left P2 segment (series 3, image 81), with a patent but irregular left PCA.  IMPRESSION: 1. Severe multifocal narrowing of the right PCA and bilateral ACA. Less significant irregularity is seen the bilateral MCAs, basilar artery, and left PCA. No large vessel occlusion. 2. No hemodynamically significant stenosis in the neck. Redemonstrated diminutive right vertebral artery, with absence of flow in the V4 segment, which is not significantly different from the 08/27/2020 CTA. Electronically Signed   By: Merilyn Baba M.D.   On: 01/17/2021 19:23   MR ANGIO NECK WO CONTRAST  Result Date: 01/17/2021 CLINICAL DATA:  Neuro deficit, stroke suspected EXAM: MRA NECK WITHOUT CONTRAST MRA HEAD WITHOUT CONTRAST TECHNIQUE: Angiographic images of the Circle of Willis were acquired using MRA technique without intravenous contrast. COMPARISON:  MRI brain 01/17/2021, correlation is also made with CTA head neck 08/27/2020. FINDINGS: MRA NECK FINDINGS Standard aortic branching. No acute dissection or aneurysm in the imaged portion. The bilateral common and internal carotid arteries are patent, without significant stenosis. The left vertebral artery is patent from its origin to the vertebrobasilar junction. The right vertebral artery is poorly visualized at its origin, possibly due to artifact; the right V2 and V3 segments are diminutive. The  right V4 segment does not demonstrate flow related signal, likely unchanged from the prior CTA. MRA HEAD FINDINGS Both internal carotid arteries are patent to the termini, with multifocal irregularity in the carotid siphons, likely atheromatous disease. The left A1 segment is not visualized. The right A1 segment is irregular with a focal area of narrowing proximal to the anterior communicating artery (series 3, image 89). Normal anterior communicating artery. Focal narrowing in the proximal right A2 segment (series 3, image 91). More distal ACA segments demonstrate poor flow and multifocal narrowing. Multifocal narrowing in the bilateral M1 segments, most prominent in  the right M1. Normal MCA bifurcations. Distal MCA branches demonstrate multifocal narrowing but appear perfused to their distal aspects. No flow is seen in the intracranial right vertebral artery. The left vertebral artery is patent to the vertebrobasilar junction. Multifocal narrowing of the basilar artery. Superior cerebellar arteries are patent bilaterally. Poor flow is seen in the right PCA, with narrowing of the right P1 (series 3, image 84) and poor visualization of the remainder of the right PCA. Focal narrowing of the proximal left P2 segment (series 3, image 81), with a patent but irregular left PCA. IMPRESSION: 1. Severe multifocal narrowing of the right PCA and bilateral ACA. Less significant irregularity is seen the bilateral MCAs, basilar artery, and left PCA. No large vessel occlusion. 2. No hemodynamically significant stenosis in the neck. Redemonstrated diminutive right vertebral artery, with absence of flow in the V4 segment, which is not significantly different from the 08/27/2020 CTA. Electronically Signed   By: Merilyn Baba M.D.   On: 01/17/2021 19:23   MR BRAIN WO CONTRAST  Result Date: 01/17/2021 CLINICAL DATA:  Neuro deficit, stroke suspected EXAM: MRI HEAD WITHOUT CONTRAST TECHNIQUE: Multiplanar, multiecho pulse sequences of the brain and surrounding structures were obtained without intravenous contrast. COMPARISON:  08/27/2020 CT, CTA, and MRI. FINDINGS: Brain: Subcentimeter focus of restricted diffusion in the left anterior frontal lobe (series 3, image 37 and series 4, image 32), with ADC correlate and without significant increased T2 signal. Sequela of prior right greater than left frontal lobe infarcts with associated encephalomalacia and gliosis. Lacunar infarcts in the right greater than left corona radiata. Confluent T2 hyperintense signal in the periventricular white matter, likely the sequela of severe chronic small vessel ischemic disease. No mass, mass effect, or midline  shift. No acute hemorrhage. No hydrocephalus or extra-axial collection. Vascular: Loss of the flow void in right vertebral artery, unchanged from the prior exam. Otherwise normal flow voids. Skull and upper cervical spine: Normal marrow signal. Sinuses/Orbits: Minimal mucosal thickening in the ethmoid air cells. Status post bilateral lens replacements. Other: The mastoids are well aerated. IMPRESSION: 1. Subcentimeter focus of restricted diffusion in the left anterior frontal lobe, consistent with acute infarct. 2. Redemonstrated sequela of prior right-greater-than-left frontal and parietal lobe infarcts. 3. Redemonstrated poor flow void in the right vertebral artery which is unchanged and correlates with poor flow in the right vertebral artery on the 08/27/2020 CTA. These results were called by telephone at the time of interpretation on 01/17/2021 at 2:28 pm to provider [facility] , who verbally acknowledged these results. Electronically Signed   By: Merilyn Baba M.D.   On: 01/17/2021 14:29   MR CERVICAL SPINE WO CONTRAST  Result Date: 01/17/2021 CLINICAL DATA:  Back pain, bilateral lower extremity weakness, difficulty walking EXAM: MRI CERVICAL AND THORACIC SPINE WITHOUT CONTRAST TECHNIQUE: Multiplanar and multiecho pulse sequences of the cervical spine, to include the craniocervical junction and cervicothoracic junction,  and the thoracic spine, were obtained without intravenous contrast. COMPARISON:  Prior MRI is not available for comparison. FINDINGS: MRI CERVICAL SPINE FINDINGS Evaluation is somewhat limited by motion artifact. Alignment: Physiologic. Vertebrae: No fracture, evidence of discitis, or bone lesion. Cord: Normal signal and morphology. Posterior Fossa, vertebral arteries, paraspinal tissues: Please see same day MRI for posterior fossa findings and same-day MRA for vertebral artery findings. The paraspinal tissues are normal. Disc levels: C2-C3: Minimal disc bulge. Facet arthropathy. No  spinal canal stenosis or neural foraminal narrowing. C3-C4: No significant disc bulge. No spinal canal stenosis. Left-greater-than-right facet arthropathy. Uncovertebral hypertrophy. Mild right neural foraminal narrowing. C4-C5: Right eccentric disc bulge. Right greater than left uncovertebral hypertrophy. Facet arthropathy. No spinal canal stenosis. Moderate right and mild left neural foraminal narrowing. C5-C6: Mild right eccentric disc bulge, which abuts the ventral cord. Mild spinal canal stenosis. Uncovertebral and facet arthropathy. Severe right and mild left neural foraminal narrowing. C6-C7: Broad-based disc bulge. No spinal canal stenosis. Uncovertebral and facet arthropathy. Severe left and moderate to severe right neural foraminal narrowing. C7-T1: No significant disc bulge. No spinal canal stenosis or neuroforaminal narrowing. MRI THORACIC SPINE FINDINGS Alignment:  Physiologic. Vertebrae: No acute fracture or suspicious osseous lesion. Cord:  Normal signal and morphology. Paraspinal and other soft tissues: Negative. Disc levels: No spinal canal stenosis. No significant neural foraminal narrowing. Some small disc bulges and facet arthropathy. IMPRESSION: 1. C5-C6 mild spinal canal stenosis, with severe right and mild left neural foraminal narrowing. 2. C6-C7 severe left and moderate to severe right neural foraminal narrowing. 3. C4-C5 moderate right and mild left neural foraminal narrowing. 4. No significant spinal canal stenosis or neural foraminal narrowing in the thoracic spine. Electronically Signed   By: Merilyn Baba M.D.   On: 01/17/2021 19:33   MR THORACIC SPINE WO CONTRAST  Result Date: 01/17/2021 CLINICAL DATA:  Back pain, bilateral lower extremity weakness, difficulty walking EXAM: MRI CERVICAL AND THORACIC SPINE WITHOUT CONTRAST TECHNIQUE: Multiplanar and multiecho pulse sequences of the cervical spine, to include the craniocervical junction and cervicothoracic junction, and the thoracic  spine, were obtained without intravenous contrast. COMPARISON:  Prior MRI is not available for comparison. FINDINGS: MRI CERVICAL SPINE FINDINGS Evaluation is somewhat limited by motion artifact. Alignment: Physiologic. Vertebrae: No fracture, evidence of discitis, or bone lesion. Cord: Normal signal and morphology. Posterior Fossa, vertebral arteries, paraspinal tissues: Please see same day MRI for posterior fossa findings and same-day MRA for vertebral artery findings. The paraspinal tissues are normal. Disc levels: C2-C3: Minimal disc bulge. Facet arthropathy. No spinal canal stenosis or neural foraminal narrowing. C3-C4: No significant disc bulge. No spinal canal stenosis. Left-greater-than-right facet arthropathy. Uncovertebral hypertrophy. Mild right neural foraminal narrowing. C4-C5: Right eccentric disc bulge. Right greater than left uncovertebral hypertrophy. Facet arthropathy. No spinal canal stenosis. Moderate right and mild left neural foraminal narrowing. C5-C6: Mild right eccentric disc bulge, which abuts the ventral cord. Mild spinal canal stenosis. Uncovertebral and facet arthropathy. Severe right and mild left neural foraminal narrowing. C6-C7: Broad-based disc bulge. No spinal canal stenosis. Uncovertebral and facet arthropathy. Severe left and moderate to severe right neural foraminal narrowing. C7-T1: No significant disc bulge. No spinal canal stenosis or neuroforaminal narrowing. MRI THORACIC SPINE FINDINGS Alignment:  Physiologic. Vertebrae: No acute fracture or suspicious osseous lesion. Cord:  Normal signal and morphology. Paraspinal and other soft tissues: Negative. Disc levels: No spinal canal stenosis. No significant neural foraminal narrowing. Some small disc bulges and facet arthropathy. IMPRESSION: 1. C5-C6 mild spinal canal  stenosis, with severe right and mild left neural foraminal narrowing. 2. C6-C7 severe left and moderate to severe right neural foraminal narrowing. 3. C4-C5  moderate right and mild left neural foraminal narrowing. 4. No significant spinal canal stenosis or neural foraminal narrowing in the thoracic spine. Electronically Signed   By: Merilyn Baba M.D.   On: 01/17/2021 19:33    PHYSICAL EXAM  Mental Status: Awake, alert, and oriented to person, place, time, and situation. She is able to provide a clear and coherent history of present illness. Speech/Language: speech is intact without dysarthria   Naming, repetition, fluency, and comprehension intact without aphasia. No neglect is noted Cranial Nerves:  II: PERRL. Visual fields full.  III, IV, VI: EOMI without ptosis, nystagmus, or gaze preference.  V: Sensation is intact to light touch and symmetrical to face.  VII: Face is symmetric resting and smiling.  VIII: Hearing is intact to voice IX, X: Palate elevation is symmetric. Phonation normal.  XI: Normal sternocleidomastoid and trapezius muscle strength XII: Tongue protrudes midline without fasciculations.   Motor: 5/5 strength present throughout with subtle decreased left hand rapid alternating movements and subtle left lower extremity weakness compared to the right. There is no pronator drift throughout.  Tone is normal. Bulk is normal.  Sensation: Intact to light touch bilaterally in all four extremities. Coordination: FTN intact bilaterally. HKS intact with subtle dysmetria on the left lower extremity  DTRs: 1+ and symmetric patellae, 2+ and symmetric biceps.   ASSESSMENT/PLAN Ms. BONETTA MOSTEK is a 75 y.o. female with history of  essential hypertension, multiple previous strokes, CAD, former tobacco use, medication non-adherence, debility, and type 2 diabetes mellitus who presented to the ED 11/14 for evaluation of bilateral lower extremity weakness. She states that she went to bed in her usual state of health at 22:00 last night and when she woke up at 06:00 to go to the restroom, she noticed that her legs were weak and her gait was  incoordinated. She states that she slowly walked to the restroom with her cane and holding onto doorways and was able to use the restroom and slowly walk back to the bed before sending a text message to her niece explaining that she thinks she had a stroke. Her niece then activated EMS for transport to the hospital.  MRI brain obtained shows Subcentimeter focus of restricted diffusion in the left anterior frontal lobe, consistent with acute infarct.    MRI of the cervical spine shows C6-C7severe left and moderate to severe right neural foraminal Narrowing. Recommend neurosurgery evaluation.    Of note, Ms. Niehaus was seen at [facility] in June of 2022 for evaluation of left-sided weakness with non-adherence to medications since previous strokes and was found to have multiple scattered small punctate infarcts bilaterally felt to be cardioembolic in nature. At discharge and follow-up with outpatient neurology, patient reported residual left-sided weakness and gait impairment. Patient went evaluation of a 30-day cardiac monitor without evidence of atrial fibrillation and was taking aspirin and atorvastatin at home.    Stroke:  left anterior frontal lobe infarct likely due to large vessel disease with severe ACA stenosis Code Stroke  CT head No acute abnormality.  Small vessel disease. Atrophy.  ASPECTS 10.       MRI  BRAIN:  1. Subcentimeter focus of restricted diffusion in the left anterior frontal lobe, consistent with acute infarct. 2. Redemonstrated sequela of prior right-greater-than-left frontal and parietal lobe infarcts.  MRA HEAD:  1. Severe multifocal narrowing of the right PCA and bilateral ACA. Less significant irregularity is seen the bilateral MCAs, basilar artery, and left PCA. No large vessel occlusion. 2. No hemodynamically significant stenosis in the neck. Redemonstrated diminutive right vertebral artery, with absence of flow in the V4 segment, which is not significantly  different from the 08/27/2020 CTA. 2D Echo pending    LDL 57 HgbA1c 5.9 VTE prophylaxis -  scd    Diet   Diet Heart Room service appropriate? Yes; Fluid consistency: Thin   aspirin 325 mg daily prior to admission, now on aspirin 325 mg daily and clopidogrel 75 mg daily for 3 months then plavix alone.  Therapy recommendations:  pending Disposition:  pending   Hypertension Home meds:  norvasc coreg Stable Permissive hypertension (OK if < 220/120) but gradually normalize in 5-7 days Long-term BP goal normotensive  Hyperlipidemia Home meds:   lipitor 80,  resumed in hospital LDL 57, goal < 70  High intensity statin  Continue statin at discharge        Diabetes HgbA1c 5.9, goal < 7.0 CBGs SSI  Other Stroke Risk Factors  Advanced Age >/= 46   Hx stroke/TIA  Congestive heart failure  Other Active Problems Acute renal failure superimposed on stage 3b chronic kidney disease (HCC) -UA with no abnormalities -light IVF overnight -hold nephrotoxic drugs (MAR not done, but appears has been on bactrim?)  -repeat bmp in AM.   Hospital day # 0     To contact Stroke Continuity provider, please refer to [email or web address] After hours, contact General Neurology

## 2021-01-18 NOTE — Discharge Instructions (Signed)
Follow with Primary MD Willeen Niece, PA in 7 days   Get CBC, CMP, 2 view Chest X ray -  checked next visit within 1 week by Primary MD     Activity: As tolerated with Full fall precautions use walker/cane & assistance as needed  Disposition Home    Diet: Heart Healthy    Special Instructions: If you have smoked or chewed Tobacco  in the last 2 yrs please stop smoking, stop any regular Alcohol  and or any Recreational drug use.  On your next visit with your primary care physician please Get Medicines reviewed and adjusted.  Please request your Prim.MD to go over all Hospital Tests and Procedure/Radiological results at the follow up, please get all Hospital records sent to your Prim MD by signing hospital release before you go home.  If you experience worsening of your admission symptoms, develop shortness of breath, life threatening emergency, suicidal or homicidal thoughts you must seek medical attention immediately by calling 911 or calling your MD immediately  if symptoms less severe.  You Must read complete instructions/literature along with all the possible adverse reactions/side effects for all the Medicines you take and that have been prescribed to you. Take any new Medicines after you have completely understood and accpet all the possible adverse reactions/side effects.

## 2021-01-18 NOTE — Evaluation (Signed)
Occupational Therapy Evaluation Patient Details Name: Ariel [name] MRN: [id number] DOB: May 09, 1945 Today's Date: 01/18/2021   History of Present Illness Pt is a 75 y/o female who presents with stroke like symptoms. 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 PMH significant for HTN, prior CVA with residual L sided weakness.   Clinical Impression   Ariel [name] was mod I PTA, and she reports that she has had multiple fall recently, she uses a cane or RW for mobility. Pt lives with her niece in a 1 level home, 0 STE. Upon evaluation pt was able to complete ADLs at a supervision-min guard level. She demonstrated poor activity tolerance and states her "legs feel like bricks" when ambulating with RW. Pt will benefit from continued OT acutely. Recommend d/c home with HHOT to progress rehab in her home environment.      Recommendations for follow up therapy are one component of a multi-disciplinary discharge planning process, led by the attending physician.  Recommendations may be updated based on patient status, additional functional criteria and insurance authorization.   Follow Up Recommendations  Home health OT    Assistance Recommended at Discharge Intermittent Supervision/Assistance  Functional Status Assessment  Patient has had a recent decline in their functional status and demonstrates the ability to make significant improvements in function in a reasonable and predictable amount of time.  Equipment Recommendations  None recommended by OT       Precautions / Restrictions Precautions Precautions: Fall Restrictions Weight Bearing Restrictions: No      Mobility Bed Mobility Overal bed mobility: Modified Independent             General bed mobility comments: sup>sit no assistance required, +use of bed rail and HOB slightly elevated    Transfers Overall transfer level: Needs assistance Equipment used: Rolling walker (2 wheels) Transfers: Sit to/from Stand Sit to Stand:  Min guard           General transfer comment: + time, effort. forward flexed posture upon standing with RW too far out in front of pt.      Balance Overall balance assessment: Needs assistance Sitting-balance support: Single extremity supported;Feet supported Sitting balance-Leahy Scale: Fair     Standing balance support: Single extremity supported;During functional activity Standing balance-Leahy Scale: Fair Standing balance comment: reliant on UE support for balance                           ADL either performed or assessed with clinical judgement   ADL Overall ADL's : Needs assistance/impaired Eating/Feeding: Independent;Sitting   Grooming: Wash/dry hands;Wash/dry face;Oral care;Brushing hair;Min guard;Standing   Upper Body Bathing: Set up;Sitting   Lower Body Bathing: Min guard;Sit to/from stand   Upper Body Dressing : Set up;Sitting   Lower Body Dressing: Min guard;Sit to/from stand   Toilet Transfer: Min guard;Ambulation;Rolling walker (2 wheels)   Toileting- Clothing Manipulation and Hygiene: Supervision/safety;Sitting/lateral lean       Functional mobility during ADLs: Min guard;Rolling walker (2 wheels) General ADL Comments: no physical assist required, min guard for safety. RW to increase stability. Pt with complaints of her legs feeling like "bricks." Pt has poor activity tolerance     Vision Baseline Vision/History: 0 No visual deficits Ability to See in Adequate Light: 0 Adequate Patient Visual Report: No change from baseline Vision Assessment?: No apparent visual deficits            Pertinent Vitals/Pain Pain Assessment: No/denies pain Faces Pain Scale: Hurts  little more Pain Location: Constipation     Hand Dominance Right   Extremity/Trunk Assessment Upper Extremity Assessment Upper Extremity Assessment: Overall WFL for tasks assessed   Lower Extremity Assessment Lower Extremity Assessment: Defer to PT evaluation   Cervical  / Trunk Assessment Cervical / Trunk Assessment: Kyphotic Cervical / Trunk Exceptions: forward head posture with rounded shoulders   Communication Communication Communication: No difficulties   Cognition Arousal/Alertness: Awake/alert Behavior During Therapy: WFL for tasks assessed/performed Overall Cognitive Status: Within Functional Limits for tasks assessed         General Comments: pt verbalized great insight into her deficits and safety awarness (need to use AD, call for RN to get up, have help once home with showers, etc.)     General Comments  VSS on RA     Home Living Family/patient expects to be discharged to:: Private residence Living Arrangements: Other relatives Available Help at Discharge: Family;Available PRN/intermittently Type of Home: House Home Access: Stairs to enter Entrance Stairs-Number of Steps: 3 Entrance Stairs-Rails: None Home Layout: One level     Bathroom Shower/Tub: Occupational psychologist: Standard     Home Equipment: Conservation officer, nature (2 wheels);Cane - single point;BSC/3in1;Shower seat          Prior Functioning/Environment Prior Level of Function : Independent/Modified Independent             Mobility Comments: As long as she has AD at home she is mod I. Does not drive ADLs Comments: Independent        OT Problem List: Decreased strength;Impaired balance (sitting and/or standing);Decreased safety awareness;Decreased knowledge of use of DME or AE;Decreased knowledge of precautions;Pain;Decreased activity tolerance      OT Treatment/Interventions: Self-care/ADL training;Therapeutic exercise;Balance training;Therapeutic activities;DME and/or AE instruction    OT Goals(Current goals can be found in the care plan section) Acute Rehab OT Goals Patient Stated Goal: stonger legs OT Goal Formulation: With patient Time For Goal Achievement: 02/01/21 Potential to Achieve Goals: Fair ADL Goals Pt/caregiver will Perform Home  Exercise Program: Increased strength;Both right and left upper extremity;With written HEP provided Additional ADL Goal #1: Pt will complete all BADLs at a mod I level with use of DME as needed Additional ADL Goal #2: Pt will independently verbalize at least 3 fall prevention strategies to promote safe transition into the home environment  OT Frequency: Min 2X/week    AM-PAC OT "6 Clicks" Daily Activity     Outcome Measure Help from another person eating meals?: None Help from another person taking care of personal grooming?: A Little Help from another person toileting, which includes using toliet, bedpan, or urinal?: A Little Help from another person bathing (including washing, rinsing, drying)?: A Little Help from another person to put on and taking off regular upper body clothing?: None Help from another person to put on and taking off regular lower body clothing?: A Little 6 Click Score: 20   End of Session Equipment Utilized During Treatment: Rolling walker (2 wheels);Gait belt Nurse Communication: Mobility status;Precautions  Activity Tolerance: Patient tolerated treatment well Patient left: in chair;with call bell/phone within reach;with chair alarm set  OT Visit Diagnosis: Other abnormalities of gait and mobility (R26.89);Repeated falls (R29.6);Pain;Unsteadiness on feet (R26.81)                Time: 5809-9833 OT Time Calculation (min): 27 min Charges:  OT General Charges $OT Visit: 1 Visit OT Evaluation $OT Eval Moderate Complexity: 1 Mod OT Treatments $Therapeutic Activity: 8-22 mins  Aoife Bold A Gracianna Vink 01/18/2021, 2:11 PM

## 2021-01-19 ENCOUNTER — Observation Stay (HOSPITAL_BASED_OUTPATIENT_CLINIC_OR_DEPARTMENT_OTHER): Payer: Medicare HMO

## 2021-01-19 ENCOUNTER — Encounter (HOSPITAL_COMMUNITY): Payer: Self-pay | Admitting: Family Medicine

## 2021-01-19 DIAGNOSIS — I10 Essential (primary) hypertension: Secondary | ICD-10-CM | POA: Diagnosis not present

## 2021-01-19 DIAGNOSIS — I6389 Other cerebral infarction: Secondary | ICD-10-CM | POA: Diagnosis not present

## 2021-01-19 DIAGNOSIS — I639 Cerebral infarction, unspecified: Secondary | ICD-10-CM | POA: Diagnosis not present

## 2021-01-19 LAB — ECHOCARDIOGRAM COMPLETE
AR max vel: 1.98 cm2
AV Area VTI: 1.9 cm2
AV Area mean vel: 1.82 cm2
AV Mean grad: 3 mmHg
AV Peak grad: 6.2 mmHg
Ao pk vel: 1.24 m/s
Area-P 1/2: 3.61 cm2
Height: 66 in
S' Lateral: 2.7 cm
Weight: 2848 oz

## 2021-01-19 NOTE — Plan of Care (Signed)
Pt is alert oriented x 4. Stand by assist. Pt requested melatonin for rest. Medication effective denies pain. Purewick and mesh panties changed.   Problem: Education: Goal: Knowledge of General Education information will improve Description: Including pain rating scale, medication(s)/side effects and non-pharmacologic comfort measures Outcome: Progressing   Problem: Health Behavior/Discharge Planning: Goal: Ability to manage health-related needs will improve Outcome: Progressing   Problem: Clinical Measurements: Goal: Ability to maintain clinical measurements within normal limits will improve Outcome: Progressing   Problem: Clinical Measurements: Goal: Ability to maintain clinical measurements within normal limits will improve Outcome: Progressing Goal: Diagnostic test results will improve Outcome: Progressing   Problem: Activity: Goal: Risk for activity intolerance will decrease Outcome: Progressing   Problem: Coping: Goal: Level of anxiety will decrease Outcome: Progressing   Problem: Elimination: Goal: Will not experience complications related to bowel motility Outcome: Progressing   Problem: Pain Managment: Goal: General experience of comfort will improve Outcome: Progressing   Problem: Safety: Goal: Ability to remain free from injury will improve Outcome: Progressing   Problem: Skin Integrity: Goal: Risk for impaired skin integrity will decrease Outcome: Progressing   Problem: Education: Goal: Knowledge of secondary prevention will improve (SELECT ALL) Outcome: Progressing Goal: Knowledge of patient specific risk factors will improve (INDIVIDUALIZE FOR PATIENT) Outcome: Progressing   Problem: Coping: Goal: Will verbalize positive feelings about self Outcome: Progressing

## 2021-01-19 NOTE — TOC Transition Note (Signed)
Transition of Care [facility]) - CM/SW Discharge Note   Patient Details  Name: Ariel [name] MRN: [id number] Date of Birth: Jun 26, 1945  [facility] CM/SW Contact:  Pollie Friar, RN Phone Number: 01/19/2021, 10:47 AM   Clinical Narrative:    Pt is discharging home with [facility] services through [facility]) home health. Information on the AVS. Pt has transport home.   Final next level of care: Home w Home Health Services Barriers to Discharge: No Barriers Identified   Patient Goals and CMS Choice   CMS Medicare.gov Compare Post Acute Care list provided to:: Patient Choice offered to / list presented to : Patient  Discharge Placement                       Discharge Plan and Services   Discharge Planning Services: CM Consult Post Acute Care Choice: Home Health                    HH Arranged: PT, OT [facility] Agency: [place] Date [place]: 01/18/21   Representative spoke with at [place]: Levada Dy  Social Determinants of Health ([place]) Interventions     Readmission Risk Interventions No flowsheet data found.

## 2021-01-19 NOTE — Discharge Summary (Signed)
Physician Discharge Summary  Ariel [name] VZS:[id number] DOB: 1945-12-18 DOA: 01/17/2021  PCP: Willeen Niece, PA  Admit date: 01/17/2021 Discharge date: 01/19/2021  Admitted From: home Disposition:  home  Recommendations for Outpatient Follow-up:  Follow up with PCP in 1-2 weeks Please obtain BMP/CBC in one week Please follow up on the following pending results:  Home Health:HHPT  Equipment/Devices: NO  Discharge Condition: Stable Code Status:   Code Status: Full Code Diet recommendation:  Diet Order             Diet - low sodium heart healthy           Diet Heart Room service appropriate? Yes; Fluid consistency: Thin  Diet effective now                   Brief/Interim Summary: 76 y.o. female with medical history significant of HTN and hx of stroke with residual left sided weakness, HLD, GERD, CKD stage 3b, depression who presented to [facility] with stroke like symptoms where she was having difficulty in walking, legs were discoordinated, weakness 1 in both lower extremities but said at times more in the left leg.  Of note she was had a stroke in June 2022 as well.  Patient was admitted seen by neurology completed a stroke work-up etiology of her symptoms likely large vessel disease. At this time she is medically stable for discharge home with changing her therapy. Discharge Diagnoses:   Bilateral leg weakness and discoordination Recent left-sided weakness with acute stroke on June/2022 etiology likely due to large vessel disease and given ACA severe stenosis.  Seen by neurology A1c 5.9 LDL 57 B12 248, echo report pending, MRI with left ACA small/punctate infarct, MRA head and neck right PCA, bilateral ICA severe stenosis, moderate stenosis in the bilateral MCA, left PCA basilar artery.  Neurology advised aspirin 325 mg, Plavix 75 DAPT x3 months then Plavix alone, Lipitor 80 mg.  Seen by PT OT and okay for discharge home. Echo came back no significant findings.  HLD continue  her Lipitor Essential hypertension blood pressure stable, allow permissive hypertension.  Resume meds on discharge GERD continue her PPI Anxiety/depression mood is stable on Zoloft and Wellbutrin Inflamed sebaceous cyst outpatient follow-up and monitoring AKI on CKD stage IIIb Baseline creat 1.5-on admission 1.9 now improved to 1.7.  Outpatient BMP in 1 week Recent Labs  Lab 01/17/21 0832 01/18/21 0240  BUN 27* 26*  CREATININE 1.98* 1.74*   Consults: neurology  Subjective: Alert awake oriented.  Resting comfortably echocardiogram done and result pending.  Eager to go home today.  Discharge Exam: Vitals:   01/19/21 0757 01/19/21 0757  BP: (!) 153/66 (!) 153/66  Pulse: 67 66  Resp: 18 18  Temp:    SpO2:  97%   General: Pt is alert, awake, not in acute distress Cardiovascular: RRR, S1/S2 +, no rubs, no gallops Respiratory: CTA bilaterally, no wheezing, no rhonchi Abdominal: Soft, NT, ND, bowel sounds + Extremities: no edema, no cyanosis  Discharge Instructions  Discharge Instructions     Diet - low sodium heart healthy   Complete by: As directed    Discharge instructions   Complete by: As directed    Follow with Primary MD Willeen Niece, PA in 7 days   Get CBC, CMP, 2 view Chest X ray -  checked next visit within 1 week by Primary MD     Continue aspirin and Plavix for 3 months afterwhich continue only Plavix  Activity: As tolerated with  Full fall precautions use walker/cane & assistance as needed  Disposition Home    Diet: Heart Healthy    Special Instructions: If you have smoked or chewed Tobacco  in the last 2 yrs please stop smoking, stop any regular Alcohol  and or any Recreational drug use.  On your next visit with your primary care physician please Get Medicines reviewed and adjusted.  Please request your Prim.MD to go over all Hospital Tests and Procedure/Radiological results at the follow up, please get all Hospital records sent to your Prim MD by  signing hospital release before you go home.  If you experience worsening of your admission symptoms, develop shortness of breath, life threatening emergency, suicidal or homicidal thoughts you must seek medical attention immediately by calling 911 or calling your MD immediately  if symptoms less severe.  You Must read complete instructions/literature along with all the possible adverse reactions/side effects for all the Medicines you take and that have been prescribed to you. Take any new Medicines after you have completely understood and accpet all the possible adverse reactions/side effects.   Increase activity slowly   Complete by: As directed       Allergies as of 01/19/2021       Reactions   Lisinopril    Other reaction(s): Cough   Perflutren Lipid Microspheres    Other reaction(s): Abdominal Pain, Muscle Pain        Medication List     TAKE these medications    amLODipine 10 MG tablet Commonly known as: NORVASC Take 10 mg by mouth at bedtime.   aspirin 325 MG tablet Take 1 tablet (325 mg total) by mouth daily.   atorvastatin 80 MG tablet Commonly known as: LIPITOR Take 80 mg by mouth every evening. What changed: Another medication with the same name was removed. Continue taking this medication, and follow the directions you see here.   buPROPion 300 MG 24 hr tablet Commonly known as: WELLBUTRIN XL Take 300 mg by mouth at bedtime.   carvedilol 3.125 MG tablet Commonly known as: COREG Take 1 tablet (3.125 mg total) by mouth 2 (two) times daily. Start taking on: January 20, 2021   clopidogrel 75 MG tablet Commonly known as: PLAVIX Take 1 tablet (75 mg total) by mouth daily.   Delsym Cough/Cold Daytime 5-10-200-325 MG/10ML Liqd Generic drug: Phenylephrine-DM-GG-APAP Take 10 mLs by mouth daily as needed (cough).   Difluprednate 0.05 % Emul Place 1 drop into the right eye as directed. Every 2 Hours   esomeprazole 40 MG capsule Commonly known as:  NEXIUM Take 40 mg by mouth daily at 12 noon.   fenofibrate micronized 200 MG capsule Commonly known as: LOFIBRA Take 200 mg by mouth at bedtime.   fexofenadine 180 MG tablet Commonly known as: ALLEGRA Take 180 mg by mouth daily as needed for allergies.   fluticasone 50 MCG/ACT nasal spray Commonly known as: FLONASE Place 1 spray into both nostrils at bedtime.   gabapentin 100 MG capsule Commonly known as: NEURONTIN Take 100 mg by mouth 3 (three) times daily as needed (pain).   melatonin 5 MG Tabs Take 5 mg by mouth at bedtime.   sulfamethoxazole-trimethoprim 800-160 MG tablet Commonly known as: BACTRIM DS Take 1 tablet by mouth 2 (two) times daily. Start date:01/15/21        Follow-up Information     [place], [place]. Schedule an appointment as soon as possible for a visit in 1 week(s).   Specialty: Physician Assistant Contact information: [phone]  [address] [place] [address] [phone]         [place], [place] Follow up.   Specialty: [place] Why: The home health agency will contact you for the first home visit Contact information: [place] [address] [phone]         Janace Litten, MD. Go on 03/02/2021.   Specialty: Neurology Why: as scheduled. Contact information: [address] [place] [address] [phone]                Allergies  Allergen Reactions   Lisinopril     Other reaction(s): Cough   Perflutren Lipid Microspheres     Other reaction(s): Abdominal Pain, Muscle Pain    The results of significant diagnostics from this hospitalization (including imaging, microbiology, ancillary and laboratory) are listed below for reference.    Microbiology: Recent Results (from the past 240 hour(s))  Resp Panel by RT-PCR (Flu A&B, Covid) Nasopharyngeal Swab     Status: None   Collection Time: 01/17/21 10:23 AM   Specimen:  Nasopharyngeal Swab; Nasopharyngeal(NP) swabs in vial transport medium  Result Value Ref Range Status   SARS Coronavirus 2 by RT PCR NEGATIVE NEGATIVE Final    Comment: (NOTE) SARS-CoV-2 target nucleic acids are NOT DETECTED.  The SARS-CoV-2 RNA is generally detectable in upper respiratory specimens during the acute phase of infection. The lowest concentration of SARS-CoV-2 viral copies this assay can detect is 138 copies/mL. A negative result does not preclude SARS-Cov-2 infection and should not be used as the sole basis for treatment or other patient management decisions. A negative result may occur with  improper specimen collection/handling, submission of specimen other than nasopharyngeal swab, presence of viral mutation(s) within the areas targeted by this assay, and inadequate number of viral copies(<138 copies/mL). A negative result must be combined with clinical observations, patient history, and epidemiological information. The expected result is Negative.  Fact Sheet for Patients:  EntrepreneurPulse.com.au  Fact Sheet for Healthcare Providers:  IncredibleEmployment.be  This test is no t yet approved or cleared by the [place] FDA and  has been authorized for detection and/or diagnosis of SARS-CoV-2 by FDA under an Emergency Use Authorization (EUA). This EUA will remain  in effect (meaning this test can be used) for the duration of the COVID-19 declaration under Section 564(b)(1) of the Act, 21 U.S.C.section 360bbb-3(b)(1), unless the authorization is terminated  or revoked sooner.       Influenza A by PCR NEGATIVE NEGATIVE Final   Influenza B by PCR NEGATIVE NEGATIVE Final    Comment: (NOTE) The Xpert Xpress SARS-CoV-2/FLU/RSV plus assay is intended as an aid in the diagnosis of influenza from Nasopharyngeal swab specimens and should not be used as a sole basis for treatment. Nasal washings and aspirates are unacceptable for  Xpert Xpress SARS-CoV-2/FLU/RSV testing.  Fact Sheet for Patients: EntrepreneurPulse.com.au  Fact Sheet for Healthcare Providers: IncredibleEmployment.be  This test is not yet approved or cleared by the [place] FDA and has been authorized for detection and/or diagnosis of SARS-CoV-2 by FDA under an Emergency Use Authorization (EUA). This EUA will remain in effect (meaning this test can be used) for the duration of the COVID-19 declaration under Section 564(b)(1) of the Act, 21 U.S.C. section 360bbb-3(b)(1), unless the authorization is terminated or revoked.  Performed at [facility], [place] [address], [place], [place] [address]     Procedures/Studies: DG Pelvis 1-2 Views  Result Date: 01/17/2021 CLINICAL DATA:  Bilateral hip pain EXAM: PELVIS - 1 VIEW COMPARISON:  None. FINDINGS: There is no evidence of pelvic fracture or diastasis. Mild degenerative changes of the bilateral SI joints. Minimal degenerative changes of the bilateral hip joints. Soft tissues are unremarkable. IMPRESSION: No acute osseous abnormality. Electronically Signed   By: Yetta Glassman M.D.   On: 01/17/2021 09:17   CT Head Wo Contrast  Result Date: 01/17/2021 CLINICAL DATA:  Patient complains of increased bilateral leg weakness. EXAM: CT HEAD WITHOUT CONTRAST TECHNIQUE: Contiguous axial images were obtained from the base of the skull through the vertex without intravenous contrast. COMPARISON:  08/27/2020 FINDINGS: Brain: No evidence of acute infarction, hemorrhage, hydrocephalus, extra-axial collection or mass lesion/mass effect. Encephalomalacia within bilateral frontal lobes is again identified compatible with previous infarcts. Chronic lacunar infarcts within the left basal ganglia is noted and appears unchanged. There is moderate to marked low-attenuation within the subcortical and periventricular white matter compatible with chronic microvascular disease.  Vascular: No hyperdense vessel or unexpected calcification. Skull: Normal. Negative for fracture or focal lesion. Sinuses/Orbits: No acute finding. Other: None. IMPRESSION: 1. No acute intracranial abnormalities. 2. Chronic small vessel ischemic disease and brain atrophy. 3. Chronic bilateral frontal lobe and left basal ganglia infarcts. Electronically Signed   By: Kerby Moors M.D.   On: 01/17/2021 09:27   MR ANGIO HEAD WO CONTRAST  Result Date: 01/17/2021 CLINICAL DATA:  Neuro deficit, stroke suspected EXAM: MRA NECK WITHOUT CONTRAST MRA HEAD WITHOUT CONTRAST TECHNIQUE: Angiographic images of the Circle of Willis were acquired using MRA technique without intravenous contrast. COMPARISON:  MRI brain 01/17/2021, correlation is also made with CTA head neck 08/27/2020. FINDINGS: MRA NECK FINDINGS Standard aortic branching. No acute dissection or aneurysm in the imaged portion. The bilateral common and internal carotid arteries are patent, without significant stenosis. The left vertebral artery is patent from its origin to the vertebrobasilar junction. The right vertebral artery is poorly visualized at its origin, possibly due to artifact; the right V2 and V3 segments are diminutive. The right V4 segment does not demonstrate flow related signal, likely unchanged from the prior CTA. MRA HEAD FINDINGS Both internal carotid arteries are patent to the termini, with multifocal irregularity in the carotid siphons, likely atheromatous disease. The left A1 segment is not visualized. The right A1 segment is irregular with a focal area of narrowing proximal to the anterior communicating artery (series 3, image 89). Normal anterior communicating artery. Focal narrowing in the proximal right A2 segment (series 3, image 91). More distal ACA segments demonstrate poor flow and multifocal narrowing. Multifocal narrowing in the bilateral M1 segments, most prominent in the right M1. Normal MCA bifurcations. Distal MCA branches  demonstrate multifocal narrowing but appear perfused to their distal aspects. No flow is seen in the intracranial right vertebral artery. The left vertebral artery is patent to the vertebrobasilar junction. Multifocal narrowing of the basilar artery. Superior cerebellar arteries are patent bilaterally. Poor flow is seen in the right PCA, with narrowing of the right P1 (series 3, image 84) and poor visualization of the remainder of the right PCA. Focal narrowing of the proximal left P2 segment (series 3, image 81), with a patent but irregular left PCA. IMPRESSION: 1. Severe multifocal narrowing of the right PCA and bilateral ACA. Less significant irregularity is seen the bilateral MCAs, basilar artery, and left PCA. No large vessel occlusion. 2. No hemodynamically significant stenosis in the neck. Redemonstrated diminutive right vertebral artery, with absence of flow in  the V4 segment, which is not significantly different from the 08/27/2020 CTA. Electronically Signed   By: Merilyn Baba M.D.   On: 01/17/2021 19:23   MR ANGIO NECK WO CONTRAST  Result Date: 01/17/2021 CLINICAL DATA:  Neuro deficit, stroke suspected EXAM: MRA NECK WITHOUT CONTRAST MRA HEAD WITHOUT CONTRAST TECHNIQUE: Angiographic images of the Circle of Willis were acquired using MRA technique without intravenous contrast. COMPARISON:  MRI brain 01/17/2021, correlation is also made with CTA head neck 08/27/2020. FINDINGS: MRA NECK FINDINGS Standard aortic branching. No acute dissection or aneurysm in the imaged portion. The bilateral common and internal carotid arteries are patent, without significant stenosis. The left vertebral artery is patent from its origin to the vertebrobasilar junction. The right vertebral artery is poorly visualized at its origin, possibly due to artifact; the right V2 and V3 segments are diminutive. The right V4 segment does not demonstrate flow related signal, likely unchanged from the prior CTA. MRA HEAD FINDINGS Both  internal carotid arteries are patent to the termini, with multifocal irregularity in the carotid siphons, likely atheromatous disease. The left A1 segment is not visualized. The right A1 segment is irregular with a focal area of narrowing proximal to the anterior communicating artery (series 3, image 89). Normal anterior communicating artery. Focal narrowing in the proximal right A2 segment (series 3, image 91). More distal ACA segments demonstrate poor flow and multifocal narrowing. Multifocal narrowing in the bilateral M1 segments, most prominent in the right M1. Normal MCA bifurcations. Distal MCA branches demonstrate multifocal narrowing but appear perfused to their distal aspects. No flow is seen in the intracranial right vertebral artery. The left vertebral artery is patent to the vertebrobasilar junction. Multifocal narrowing of the basilar artery. Superior cerebellar arteries are patent bilaterally. Poor flow is seen in the right PCA, with narrowing of the right P1 (series 3, image 84) and poor visualization of the remainder of the right PCA. Focal narrowing of the proximal left P2 segment (series 3, image 81), with a patent but irregular left PCA. IMPRESSION: 1. Severe multifocal narrowing of the right PCA and bilateral ACA. Less significant irregularity is seen the bilateral MCAs, basilar artery, and left PCA. No large vessel occlusion. 2. No hemodynamically significant stenosis in the neck. Redemonstrated diminutive right vertebral artery, with absence of flow in the V4 segment, which is not significantly different from the 08/27/2020 CTA. Electronically Signed   By: Merilyn Baba M.D.   On: 01/17/2021 19:23   MR BRAIN WO CONTRAST  Result Date: 01/17/2021 CLINICAL DATA:  Neuro deficit, stroke suspected EXAM: MRI HEAD WITHOUT CONTRAST TECHNIQUE: Multiplanar, multiecho pulse sequences of the brain and surrounding structures were obtained without intravenous contrast. COMPARISON:  08/27/2020 CT, CTA,  and MRI. FINDINGS: Brain: Subcentimeter focus of restricted diffusion in the left anterior frontal lobe (series 3, image 37 and series 4, image 32), with ADC correlate and without significant increased T2 signal. Sequela of prior right greater than left frontal lobe infarcts with associated encephalomalacia and gliosis. Lacunar infarcts in the right greater than left corona radiata. Confluent T2 hyperintense signal in the periventricular white matter, likely the sequela of severe chronic small vessel ischemic disease. No mass, mass effect, or midline shift. No acute hemorrhage. No hydrocephalus or extra-axial collection. Vascular: Loss of the flow void in right vertebral artery, unchanged from the prior exam. Otherwise normal flow voids. Skull and upper cervical spine: Normal marrow signal. Sinuses/Orbits: Minimal mucosal thickening in the ethmoid air cells. Status post bilateral lens replacements. Other: The  mastoids are well aerated. IMPRESSION: 1. Subcentimeter focus of restricted diffusion in the left anterior frontal lobe, consistent with acute infarct. 2. Redemonstrated sequela of prior right-greater-than-left frontal and parietal lobe infarcts. 3. Redemonstrated poor flow void in the right vertebral artery which is unchanged and correlates with poor flow in the right vertebral artery on the 08/27/2020 CTA. These results were called by telephone at the time of interpretation on 01/17/2021 at 2:28 pm to provider [facility] , who verbally acknowledged these results. Electronically Signed   By: Merilyn Baba M.D.   On: 01/17/2021 14:29   MR CERVICAL SPINE WO CONTRAST  Result Date: 01/17/2021 CLINICAL DATA:  Back pain, bilateral lower extremity weakness, difficulty walking EXAM: MRI CERVICAL AND THORACIC SPINE WITHOUT CONTRAST TECHNIQUE: Multiplanar and multiecho pulse sequences of the cervical spine, to include the craniocervical junction and cervicothoracic junction, and the thoracic spine, were  obtained without intravenous contrast. COMPARISON:  Prior MRI is not available for comparison. FINDINGS: MRI CERVICAL SPINE FINDINGS Evaluation is somewhat limited by motion artifact. Alignment: Physiologic. Vertebrae: No fracture, evidence of discitis, or bone lesion. Cord: Normal signal and morphology. Posterior Fossa, vertebral arteries, paraspinal tissues: Please see same day MRI for posterior fossa findings and same-day MRA for vertebral artery findings. The paraspinal tissues are normal. Disc levels: C2-C3: Minimal disc bulge. Facet arthropathy. No spinal canal stenosis or neural foraminal narrowing. C3-C4: No significant disc bulge. No spinal canal stenosis. Left-greater-than-right facet arthropathy. Uncovertebral hypertrophy. Mild right neural foraminal narrowing. C4-C5: Right eccentric disc bulge. Right greater than left uncovertebral hypertrophy. Facet arthropathy. No spinal canal stenosis. Moderate right and mild left neural foraminal narrowing. C5-C6: Mild right eccentric disc bulge, which abuts the ventral cord. Mild spinal canal stenosis. Uncovertebral and facet arthropathy. Severe right and mild left neural foraminal narrowing. C6-C7: Broad-based disc bulge. No spinal canal stenosis. Uncovertebral and facet arthropathy. Severe left and moderate to severe right neural foraminal narrowing. C7-T1: No significant disc bulge. No spinal canal stenosis or neuroforaminal narrowing. MRI THORACIC SPINE FINDINGS Alignment:  Physiologic. Vertebrae: No acute fracture or suspicious osseous lesion. Cord:  Normal signal and morphology. Paraspinal and other soft tissues: Negative. Disc levels: No spinal canal stenosis. No significant neural foraminal narrowing. Some small disc bulges and facet arthropathy. IMPRESSION: 1. C5-C6 mild spinal canal stenosis, with severe right and mild left neural foraminal narrowing. 2. C6-C7 severe left and moderate to severe right neural foraminal narrowing. 3. C4-C5 moderate right and  mild left neural foraminal narrowing. 4. No significant spinal canal stenosis or neural foraminal narrowing in the thoracic spine. Electronically Signed   By: Merilyn Baba M.D.   On: 01/17/2021 19:33   MR THORACIC SPINE WO CONTRAST  Result Date: 01/17/2021 CLINICAL DATA:  Back pain, bilateral lower extremity weakness, difficulty walking EXAM: MRI CERVICAL AND THORACIC SPINE WITHOUT CONTRAST TECHNIQUE: Multiplanar and multiecho pulse sequences of the cervical spine, to include the craniocervical junction and cervicothoracic junction, and the thoracic spine, were obtained without intravenous contrast. COMPARISON:  Prior MRI is not available for comparison. FINDINGS: MRI CERVICAL SPINE FINDINGS Evaluation is somewhat limited by motion artifact. Alignment: Physiologic. Vertebrae: No fracture, evidence of discitis, or bone lesion. Cord: Normal signal and morphology. Posterior Fossa, vertebral arteries, paraspinal tissues: Please see same day MRI for posterior fossa findings and same-day MRA for vertebral artery findings. The paraspinal tissues are normal. Disc levels: C2-C3: Minimal disc bulge. Facet arthropathy. No spinal canal stenosis or neural foraminal narrowing. C3-C4: No significant disc bulge. No spinal canal stenosis.  Left-greater-than-right facet arthropathy. Uncovertebral hypertrophy. Mild right neural foraminal narrowing. C4-C5: Right eccentric disc bulge. Right greater than left uncovertebral hypertrophy. Facet arthropathy. No spinal canal stenosis. Moderate right and mild left neural foraminal narrowing. C5-C6: Mild right eccentric disc bulge, which abuts the ventral cord. Mild spinal canal stenosis. Uncovertebral and facet arthropathy. Severe right and mild left neural foraminal narrowing. C6-C7: Broad-based disc bulge. No spinal canal stenosis. Uncovertebral and facet arthropathy. Severe left and moderate to severe right neural foraminal narrowing. C7-T1: No significant disc bulge. No spinal canal  stenosis or neuroforaminal narrowing. MRI THORACIC SPINE FINDINGS Alignment:  Physiologic. Vertebrae: No acute fracture or suspicious osseous lesion. Cord:  Normal signal and morphology. Paraspinal and other soft tissues: Negative. Disc levels: No spinal canal stenosis. No significant neural foraminal narrowing. Some small disc bulges and facet arthropathy. IMPRESSION: 1. C5-C6 mild spinal canal stenosis, with severe right and mild left neural foraminal narrowing. 2. C6-C7 severe left and moderate to severe right neural foraminal narrowing. 3. C4-C5 moderate right and mild left neural foraminal narrowing. 4. No significant spinal canal stenosis or neural foraminal narrowing in the thoracic spine. Electronically Signed   By: Merilyn Baba M.D.   On: 01/17/2021 19:33   DG SWALLOW FUNC OP MEDICARE SPEECH PATH  Result Date: 12/23/2020 Table formatting from the original result was not included. Objective Swallowing Evaluation: Type of Study: MBS-Modified Barium Swallow Study  Patient Details Name: MONTIE GELARDI MRN: [id number] Date of Birth: [birth year] Today's Date: 12/23/2020 Time: SLP Start Time (ACUTE ONLY): 1135 -SLP Stop Time (ACUTE ONLY): 1150 SLP Time Calculation (min) (ACUTE ONLY): 15 min Past Medical History: Past Medical History: Diagnosis Date  Hypertension   Stroke [facility])  Past Surgical History: Past Surgical History: Procedure Laterality Date  TUBAL LIGATION    Performed at the age of 75 years old HPI: Ms. ALYXIS GRIPPI is a 75 y.o. female with multiple previous strokes. Reports chronic swallowing difficulties with larger pills which slightly worsened since her stroke.  Denies difficulty swallowing food or liquid.  No data recorded Assessment / Plan / Recommendation CHL IP CLINICAL IMPRESSIONS 12/23/2020 Clinical Impression Pt has adequate swallowing function. No weakness or timing impairment. Pt tolerated all textures though needed extra time for mastication given missing dentition. She was unable to  orally transit pill; consistently separated pill from liquid or puree bolus and held anteriorally and spit out. SLP attempted to cue pt with strategies to no effect. Recommend pt avoid large pills with assist of MD and pharmacist. SLP Visit Diagnosis Dysphagia, unspecified (R13.10) Attention and concentration deficit following -- Frontal lobe and executive function deficit following -- Impact on safety and function No limitations   CHL IP TREATMENT RECOMMENDATION 12/23/2020 Treatment Recommendations No treatment recommended at this time   No flowsheet data found. CHL IP DIET RECOMMENDATION 12/23/2020 SLP Diet Recommendations Regular solids;Thin liquid Liquid Administration via Cup;Straw Medication Administration Whole meds with liquid Compensations Slow rate;Small sips/bites Postural Changes Seated upright at 90 degrees   No flowsheet data found.  No flowsheet data found.  No flowsheet data found.     CHL IP ORAL PHASE 12/23/2020 Oral Phase WFL Oral - Pudding Teaspoon -- Oral - Pudding Cup -- Oral - Honey Teaspoon -- Oral - Honey Cup -- Oral - Nectar Teaspoon -- Oral - Nectar Cup -- Oral - Nectar Straw -- Oral - Thin Teaspoon -- Oral - Thin Cup -- Oral - Thin Straw -- Oral - Puree -- Oral - Mech Soft -- Oral -  Regular -- Oral - Multi-Consistency -- Oral - Pill -- Oral Phase - Comment --  CHL IP PHARYNGEAL PHASE 12/23/2020 Pharyngeal Phase WFL Pharyngeal- Pudding Teaspoon -- Pharyngeal -- Pharyngeal- Pudding Cup -- Pharyngeal -- Pharyngeal- Honey Teaspoon -- Pharyngeal -- Pharyngeal- Honey Cup -- Pharyngeal -- Pharyngeal- Nectar Teaspoon -- Pharyngeal -- Pharyngeal- Nectar Cup -- Pharyngeal -- Pharyngeal- Nectar Straw -- Pharyngeal -- Pharyngeal- Thin Teaspoon -- Pharyngeal -- Pharyngeal- Thin Cup -- Pharyngeal -- Pharyngeal- Thin Straw -- Pharyngeal -- Pharyngeal- Puree -- Pharyngeal -- Pharyngeal- Mechanical Soft -- Pharyngeal -- Pharyngeal- Regular -- Pharyngeal -- Pharyngeal- Multi-consistency -- Pharyngeal --  Pharyngeal- Pill -- Pharyngeal -- Pharyngeal Comment --  No flowsheet data found. DeBlois, Katherene Ponto 12/23/2020, 1:22 PM            CLINICAL DATA:  Dysphagia. EXAM: MODIFIED BARIUM SWALLOW TECHNIQUE: Different consistencies of barium were administered orally to the patient by the Speech Pathologist. Imaging of the pharynx was performed in the lateral projection. The radiologist was present in the fluoroscopy room for this study, providing personal supervision. FLUOROSCOPY TIME:  Fluoroscopy Time:  1 minute and 29 seconds Radiation Exposure Index (if provided by the fluoroscopic device): Not applicable. Number of Acquired Spot Images: 0 COMPARISON:  None. FINDINGS: With thin liquids and puree thickness, no swallow dysfunction identified. IMPRESSION: No evidence of swallow dysfunction. Please refer to the Speech Pathologists report for complete details and recommendations. Electronically Signed   By: Abigail Miyamoto M.D.   On: 12/23/2020 12:06    Labs: BNP (last 3 results) No results for input(s): BNP in the last 8760 hours. Basic Metabolic Panel: Recent Labs  Lab 01/17/21 0832 01/18/21 0240  NA 133* 136  K 4.1 3.8  CL 100 106  CO2 25 23  GLUCOSE 122* 119*  BUN 27* 26*  CREATININE 1.98* 1.74*  CALCIUM 9.0 8.5*   Liver Function Tests: No results for input(s): AST, ALT, ALKPHOS, BILITOT, PROT, ALBUMIN in the last 168 hours. No results for input(s): LIPASE, AMYLASE in the last 168 hours. No results for input(s): AMMONIA in the last 168 hours. CBC: Recent Labs  Lab 01/17/21 0832 01/18/21 0240  WBC 8.7 7.7  HGB 11.5* 10.9*  HCT 35.5* 34.1*  MCV 84.9 84.0  PLT 351 297   Cardiac Enzymes: No results for input(s): CKTOTAL, CKMB, CKMBINDEX, TROPONINI in the last 168 hours. BNP: Invalid input(s): POCBNP CBG: Recent Labs  Lab 01/17/21 0835  GLUCAP 126*   D-Dimer No results for input(s): DDIMER in the last 72 hours. Hgb A1c Recent Labs    01/17/21 1941  HGBA1C 5.9*   Lipid  Profile Recent Labs    01/17/21 1941  CHOL 122  HDL 48  LDLCALC 57  TRIG 85  CHOLHDL 2.5   Thyroid function studies Recent Labs    01/17/21 1941  TSH 4.115   Anemia work up Recent Labs    01/17/21 1941  VITAMINB12 248   Urinalysis    Component Value Date/Time   COLORURINE YELLOW 01/17/2021 [place] 01/17/2021 1613   LABSPEC 1.012 01/17/2021 1613   PHURINE 6.0 01/17/2021 1613   GLUCOSEU NEGATIVE 01/17/2021 1613   HGBUR NEGATIVE 01/17/2021 1613   BILIRUBINUR NEGATIVE 01/17/2021 1613   KETONESUR NEGATIVE 01/17/2021 1613   PROTEINUR NEGATIVE 01/17/2021 1613   NITRITE NEGATIVE 01/17/2021 1613   LEUKOCYTESUR NEGATIVE 01/17/2021 1613   Sepsis Labs Invalid input(s): PROCALCITONIN,  WBC,  LACTICIDVEN Microbiology Recent Results (from the past 240 hour(s))  Resp Panel by RT-PCR (  Flu A&B, Covid) Nasopharyngeal Swab     Status: None   Collection Time: 01/17/21 10:23 AM   Specimen: Nasopharyngeal Swab; Nasopharyngeal(NP) swabs in vial transport medium  Result Value Ref Range Status   SARS Coronavirus 2 by RT PCR NEGATIVE NEGATIVE Final    Comment: (NOTE) SARS-CoV-2 target nucleic acids are NOT DETECTED.  The SARS-CoV-2 RNA is generally detectable in upper respiratory specimens during the acute phase of infection. The lowest concentration of SARS-CoV-2 viral copies this assay can detect is 138 copies/mL. A negative result does not preclude SARS-Cov-2 infection and should not be used as the sole basis for treatment or other patient management decisions. A negative result may occur with  improper specimen collection/handling, submission of specimen other than nasopharyngeal swab, presence of viral mutation(s) within the areas targeted by this assay, and inadequate number of viral copies(<138 copies/mL). A negative result must be combined with clinical observations, patient history, and epidemiological information. The expected result is Negative.  Fact  Sheet for Patients:  EntrepreneurPulse.com.au  Fact Sheet for Healthcare Providers:  IncredibleEmployment.be  This test is no t yet approved or cleared by the [place] FDA and  has been authorized for detection and/or diagnosis of SARS-CoV-2 by FDA under an Emergency Use Authorization (EUA). This EUA will remain  in effect (meaning this test can be used) for the duration of the COVID-19 declaration under Section 564(b)(1) of the Act, 21 U.S.C.section 360bbb-3(b)(1), unless the authorization is terminated  or revoked sooner.       Influenza A by PCR NEGATIVE NEGATIVE Final   Influenza B by PCR NEGATIVE NEGATIVE Final    Comment: (NOTE) The Xpert Xpress SARS-CoV-2/FLU/RSV plus assay is intended as an aid in the diagnosis of influenza from Nasopharyngeal swab specimens and should not be used as a sole basis for treatment. Nasal washings and aspirates are unacceptable for Xpert Xpress SARS-CoV-2/FLU/RSV testing.  Fact Sheet for Patients: EntrepreneurPulse.com.au  Fact Sheet for Healthcare Providers: IncredibleEmployment.be  This test is not yet approved or cleared by the [place] FDA and has been authorized for detection and/or diagnosis of SARS-CoV-2 by FDA under an Emergency Use Authorization (EUA). This EUA will remain in effect (meaning this test can be used) for the duration of the COVID-19 declaration under Section 564(b)(1) of the Act, 21 U.S.C. section 360bbb-3(b)(1), unless the authorization is terminated or revoked.  Performed at [facility], [place] [address] [place], [place] [address]      Time coordinating discharge: 25 minutes  SIGNED: Antonieta Pert, MD  [facility] 01/19/2021, 1:08 PM  If 7PM-7AM, please contact night-coverage [email or web address]

## 2021-02-07 ENCOUNTER — Ambulatory Visit (INDEPENDENT_AMBULATORY_CARE_PROVIDER_SITE_OTHER): Payer: Medicare HMO

## 2021-02-07 DIAGNOSIS — I639 Cerebral infarction, unspecified: Secondary | ICD-10-CM | POA: Diagnosis not present

## 2021-02-07 LAB — CUP PACEART REMOTE DEVICE CHECK
Date Time Interrogation Session: 20221205111301
Implantable Pulse Generator Implant Date: 20220920

## 2021-02-16 NOTE — Progress Notes (Signed)
Carelink Summary Report / Loop Recorder 

## 2021-02-21 ENCOUNTER — Emergency Department (HOSPITAL_COMMUNITY)
Admission: EM | Admit: 2021-02-21 | Discharge: 2021-02-22 | Disposition: A | Discharge status: Home / Self Care | Payer: Medicare HMO | Attending: Emergency Medicine | Admitting: Emergency Medicine

## 2021-02-21 ENCOUNTER — Other Ambulatory Visit: Payer: Self-pay

## 2021-02-21 DIAGNOSIS — Z7982 Long term (current) use of aspirin: Secondary | ICD-10-CM | POA: Diagnosis not present

## 2021-02-21 DIAGNOSIS — N1832 Chronic kidney disease, stage 3b: Secondary | ICD-10-CM | POA: Diagnosis not present

## 2021-02-21 DIAGNOSIS — R29898 Other symptoms and signs involving the musculoskeletal system: Secondary | ICD-10-CM

## 2021-02-21 DIAGNOSIS — I129 Hypertensive chronic kidney disease with stage 1 through stage 4 chronic kidney disease, or unspecified chronic kidney disease: Secondary | ICD-10-CM | POA: Insufficient documentation

## 2021-02-21 DIAGNOSIS — Z7902 Long term (current) use of antithrombotics/antiplatelets: Secondary | ICD-10-CM | POA: Insufficient documentation

## 2021-02-21 DIAGNOSIS — Z79899 Other long term (current) drug therapy: Secondary | ICD-10-CM | POA: Diagnosis not present

## 2021-02-21 DIAGNOSIS — R531 Weakness: Secondary | ICD-10-CM | POA: Diagnosis present

## 2021-02-21 DIAGNOSIS — U071 COVID-19: Secondary | ICD-10-CM | POA: Insufficient documentation

## 2021-02-21 LAB — CBC WITH DIFFERENTIAL/PLATELET
Abs Immature Granulocytes: 0.04 10*3/uL (ref 0.00–0.07)
Basophils Absolute: 0 10*3/uL (ref 0.0–0.1)
Basophils Relative: 0 %
Eosinophils Absolute: 0 10*3/uL (ref 0.0–0.5)
Eosinophils Relative: 0 %
HCT: 35.7 % — ABNORMAL LOW (ref 36.0–46.0)
Hemoglobin: 11.4 g/dL — ABNORMAL LOW (ref 12.0–15.0)
Immature Granulocytes: 0 %
Lymphocytes Relative: 14 %
Lymphs Abs: 1.2 10*3/uL (ref 0.7–4.0)
MCH: 27.4 pg (ref 26.0–34.0)
MCHC: 31.9 g/dL (ref 30.0–36.0)
MCV: 85.8 fL (ref 80.0–100.0)
Monocytes Absolute: 0.7 10*3/uL (ref 0.1–1.0)
Monocytes Relative: 8 %
Neutro Abs: 7.1 10*3/uL (ref 1.7–7.7)
Neutrophils Relative %: 78 %
Platelets: 264 10*3/uL (ref 150–400)
RBC: 4.16 MIL/uL (ref 3.87–5.11)
RDW: 15.2 % (ref 11.5–15.5)
WBC: 9.1 10*3/uL (ref 4.0–10.5)
nRBC: 0 % (ref 0.0–0.2)

## 2021-02-21 LAB — COMPREHENSIVE METABOLIC PANEL
ALT: 22 U/L (ref 0–44)
AST: 23 U/L (ref 15–41)
Albumin: 3.6 g/dL (ref 3.5–5.0)
Alkaline Phosphatase: 85 U/L (ref 38–126)
Anion gap: 9 (ref 5–15)
BUN: 18 mg/dL (ref 8–23)
CO2: 24 mmol/L (ref 22–32)
Calcium: 9 mg/dL (ref 8.9–10.3)
Chloride: 101 mmol/L (ref 98–111)
Creatinine, Ser: 1.51 mg/dL — ABNORMAL HIGH (ref 0.44–1.00)
GFR, Estimated: 36 mL/min — ABNORMAL LOW (ref >60)
Glucose, Bld: 112 mg/dL — ABNORMAL HIGH (ref 70–99)
Potassium: 3.8 mmol/L (ref 3.5–5.1)
Sodium: 134 mmol/L — ABNORMAL LOW (ref 135–145)
Total Bilirubin: 1.1 mg/dL (ref 0.3–1.2)
Total Protein: 7.5 g/dL (ref 6.5–8.1)

## 2021-02-21 LAB — TROPONIN I (HIGH SENSITIVITY)
Troponin I (High Sensitivity): 6 ng/L (ref <18)
Troponin I (High Sensitivity): 6 ng/L (ref <18)

## 2021-02-21 LAB — LIPASE, BLOOD: Lipase: 26 U/L (ref 11–51)

## 2021-02-21 NOTE — ED Triage Notes (Signed)
Pt brought to ED triage via wheelchair by [facility] with c/o numbness to BLE and heaviness in feet. Pt reports onset of symptoms occurred suddenly while using restroom at home. Also states episode was accompanied by the dizziness and nausea. Reports hx of TIAs and states symptoms are now resolving.

## 2021-02-21 NOTE — ED Provider Notes (Signed)
Emergency Medicine Provider Triage Evaluation Note  Ariel [name] , a 75 y.o. female  was evaluated in triage.  Pt complains of patient here with complaint of bilateral lower extremity numbness and heaviness, inability to walk.  This occurred just after sitting on the toilet for some period of time.  She states that her family helped her by putting her in a rolling chair.  She had 1 episode of vomiting but that has resolved.  She denies chest pain or shortness of breath.  She was ambulatory with assistance.  Her symptoms have significantly improved on her ride over..  Review of Systems  Positive: Numbness and weakness Negative: Chest pain shortness of breath  Physical Exam  LMP  (LMP Unknown)  Gen:   Awake, no distress   Resp:  Normal effort  MSK:   Moves extremities without difficulty  Other:  Bilateral lower extremity numbness  Medical Decision Making  Medically screening exam initiated at 7:18 PM.  Appropriate orders placed.  DAJE STARK was informed that the remainder of the evaluation will be completed by another provider, this initial triage assessment does not replace that evaluation, and the importance of remaining in the ED until their evaluation is complete.  Work-up initiated   Margarita Mail, PA-C 02/21/21 1919    Truddie Hidden, MD 02/21/21 2237

## 2021-02-22 ENCOUNTER — Emergency Department (HOSPITAL_COMMUNITY): Payer: Medicare HMO

## 2021-02-22 ENCOUNTER — Encounter (HOSPITAL_COMMUNITY): Payer: Self-pay

## 2021-02-22 DIAGNOSIS — U071 COVID-19: Secondary | ICD-10-CM | POA: Diagnosis not present

## 2021-02-22 LAB — RESP PANEL BY RT-PCR (FLU A&B, COVID) ARPGX2
Influenza A by PCR: NEGATIVE
Influenza B by PCR: NEGATIVE
SARS Coronavirus 2 by RT PCR: POSITIVE — AB

## 2021-02-22 NOTE — Discharge Instructions (Addendum)
You had blood tests as well as an MRI scan of your brain done in the ER today.  Thankfully we did not see signs of a new stroke.  I suspect that this was likely a recurrence of-year-old stroke symptoms, which can be common, and is called "recrudescence."  Your strength appeared to get better over the hours he spent in the ER, and you are able to walk on your own at the end of your stay.  Please continue taking all of your normal medications, including your morning med schedule for today.  You should follow-up with your neurologist.  Please use a walker at all times at home, and take your time when standing up.  Be extra careful climbing stairs.  If your weakness suddenly returns, or you have new stroke symptoms, including difficulty speaking, facial droop, severe headache, sudden vertigo, or loss of feeling in your arms, please call 911 and return to the ER.

## 2021-02-22 NOTE — ED Notes (Signed)
Reviewed discharge instructions with patient. Follow-up care reviewed. Patient verbalized understanding. Patient A&Ox4, VSS, and ambulatory with steady gait upon discharge.  

## 2021-02-22 NOTE — ED Notes (Signed)
Pt back from MRI 

## 2021-02-22 NOTE — ED Notes (Signed)
Pt ambulated to bathroom w/ steady gait to bathroom. Pt walking w/ short stride. Pt reports she can't walk like her normal because he legs feel weak and feet feel heavy. Pt has WNL strength to all extremities upon assessment.

## 2021-02-22 NOTE — ED Provider Notes (Signed)
[facility] EMERGENCY DEPARTMENT Provider Note   CSN: [id number] Arrival date & time: 02/21/21  1919     History Chief Complaint  Patient presents with   Numbness   Dizziness    Ariel [name] is a 75 y.o. female w/ hx of CVA, HTN, presenting to the ED with numbness and weakness in her legs, and vomiting.  The patient reports that ever since her stroke, she has had some weakness in her bilateral legs, as this was her initial stroke symptoms, but overall she can walk essentially unassisted and firmly.  She states that she was using the toilet yesterday, and felt very weak afterwards and is having difficulty walking.  She is in both of her legs felt weak and heavy, like there were cinderblocks on the end of her feet.  She does try to sit down to eat something and as soon as she put the food in her mouth, she abruptly vomited.  She denies any vertigo at the time, denies any chest pain or pressure.  She denies any additional symptoms such as difficulty with speech, numbness or weakness of her arms or legs.  She came into the ER last night around 7 PM, where she was triage, had screening labs done, and unfortunately had a prolonged wait of approximately 12 hours prior to my assessment in the morning.  She reports at this time that overall symptoms are significantly improved.  She denies any nausea.  She reports that her legs still feel "weak and funny" but overall the strength has improved in her legs.  She was prescribed aspirin and Plavix and has been taking both of those since her stroke (aspirin 325, plavix 75 x 3 months)  MRI brain on 01/17/21: IMPRESSION: 1. Subcentimeter focus of restricted diffusion in the left anterior frontal lobe, consistent with acute infarct. 2. Redemonstrated sequela of prior right-greater-than-left frontal and parietal lobe infarcts. 3. Redemonstrated poor flow void in the right vertebral artery which is unchanged and correlates with poor flow  in the right vertebral artery on the 08/27/2020 CTA.  HPI     Past Medical History:  Diagnosis Date   Hypertension    Stroke [facility])     Patient Active Problem List   Diagnosis Date Noted   Acute CVA (cerebrovascular accident) ([place]) 01/17/2021   CVA (cerebral vascular accident) ([place]) 08/27/2020   Acute renal failure superimposed on stage 3b chronic kidney disease ([place]) 08/27/2020   Benign essential HTN 08/27/2020   Anxiety and depression 08/27/2020   GERD (gastroesophageal reflux disease) 08/27/2020   Stage 3b chronic kidney disease ([place]) 10/20/2019    Past Surgical History:  Procedure Laterality Date   TUBAL LIGATION     Performed at the age of 76 years old     OB History   No obstetric history on file.     Family History  Problem Relation Age of Onset   COPD Mother    Heart disease Father    CVA Maternal Grandmother    Heart disease Paternal Grandmother    CVA Paternal Grandfather     Social History   Tobacco Use   Smoking status: Never   Smokeless tobacco: Never  Vaping Use   Vaping Use: Never used  Substance Use Topics   Alcohol use: Never   Drug use: Never    Home Medications Prior to Admission medications   Medication Sig Start Date End Date Taking? Authorizing Provider  amLODipine (NORVASC) 10 MG tablet Take 10 mg by  mouth at bedtime. 07/01/20 07/01/21  [provider]  aspirin 325 MG tablet Take 1 tablet (325 mg total) by mouth daily. 01/18/21   Thurnell Lose, MD  atorvastatin (LIPITOR) 80 MG tablet Take 80 mg by mouth every evening. 12/16/20   [provider]  buPROPion (WELLBUTRIN XL) 300 MG 24 hr tablet Take 300 mg by mouth at bedtime.    [provider]  carvedilol (COREG) 3.125 MG tablet Take 1 tablet (3.125 mg total) by mouth 2 (two) times daily. 01/20/21   Thurnell Lose, MD  clopidogrel (PLAVIX) 75 MG tablet Take 1 tablet (75 mg total) by mouth daily. 01/19/21   Thurnell Lose, MD  Difluprednate 0.05 %  EMUL Place 1 drop into the right eye as directed. Every 2 Hours    [provider]  esomeprazole (NEXIUM) 40 MG capsule Take 40 mg by mouth daily at 12 noon.    [provider]  fenofibrate micronized (LOFIBRA) 200 MG capsule Take 200 mg by mouth at bedtime.    [provider]  fexofenadine (ALLEGRA) 180 MG tablet Take 180 mg by mouth daily as needed for allergies.    [provider]  fluticasone (FLONASE) 50 MCG/ACT nasal spray Place 1 spray into both nostrils at bedtime. 11/03/13 01/17/21  [provider]  gabapentin (NEURONTIN) 100 MG capsule Take 100 mg by mouth 3 (three) times daily as needed (pain). 07/01/20 07/01/21  [provider]  melatonin 5 MG TABS Take 5 mg by mouth at bedtime.    [provider]  Phenylephrine-DM-GG-APAP (DELSYM COUGH/COLD DAYTIME) 5-10-200-325 MG/10ML LIQD Take 10 mLs by mouth daily as needed (cough).    [provider]    Allergies    Lisinopril and Perflutren lipid microspheres  Review of Systems   Review of Systems  Constitutional:  Negative for chills and fever.  HENT:  Negative for ear pain and sore throat.   Eyes:  Negative for pain and visual disturbance.  Respiratory:  Positive for cough. Negative for shortness of breath.   Cardiovascular:  Negative for chest pain and palpitations.  Gastrointestinal:  Positive for nausea and vomiting. Negative for abdominal pain.  Genitourinary:  Negative for dysuria and hematuria.  Musculoskeletal:  Negative for arthralgias and myalgias.  Skin:  Negative for color change and rash.  Neurological:  Positive for weakness and numbness. Negative for dizziness, syncope, facial asymmetry and light-headedness.  All other systems reviewed and are negative.  Physical Exam Updated Vital Signs BP 127/69 (BP Location: Right Arm)    Pulse 76    Temp 98.5 F (36.9 C) (Oral)    Resp 16    Ht 5\' 6"  (1.676 m)    Wt 78.9 kg    LMP  (LMP Unknown)    SpO2 97%    BMI  28.08 kg/m   Physical Exam Constitutional:      General: She is not in acute distress. HENT:     Head: Normocephalic and atraumatic.  Eyes:     Conjunctiva/sclera: Conjunctivae normal.     Pupils: Pupils are equal, round, and reactive to light.  Cardiovascular:     Rate and Rhythm: Normal rate and regular rhythm.  Pulmonary:     Effort: Pulmonary effort is normal. No respiratory distress.  Abdominal:     General: There is no distension.     Tenderness: There is no abdominal tenderness.  Skin:    General: Skin is warm and dry.  Neurological:  General: No focal deficit present.     Mental Status: She is alert and oriented to person, place, and time. Mental status is at baseline.     Sensory: No sensory deficit.     Motor: No weakness.  Psychiatric:        Mood and Affect: Mood normal.        Behavior: Behavior normal.    ED Results / Procedures / Treatments   Labs (all labs ordered are listed, but only abnormal results are displayed) Labs Reviewed  RESP PANEL BY RT-PCR (FLU A&B, COVID) ARPGX2 - Abnormal; Notable for the following components:      Result Value   SARS Coronavirus 2 by RT PCR POSITIVE (*)    All other components within normal limits  CBC WITH DIFFERENTIAL/PLATELET - Abnormal; Notable for the following components:   Hemoglobin 11.4 (*)    HCT 35.7 (*)    All other components within normal limits  COMPREHENSIVE METABOLIC PANEL - Abnormal; Notable for the following components:   Sodium 134 (*)    Glucose, Bld 112 (*)    Creatinine, Ser 1.51 (*)    GFR, Estimated 36 (*)    All other components within normal limits  LIPASE, BLOOD  URINALYSIS, ROUTINE W REFLEX MICROSCOPIC  TROPONIN I (HIGH SENSITIVITY)  TROPONIN I (HIGH SENSITIVITY)    EKG EKG Interpretation  Date/Time:  Monday February 21 2021 19:17:07 EST Ventricular Rate:  80 PR Interval:  162 QRS Duration: 88 QT Interval:  374 QTC Calculation: 431 R Axis:   31 Text Interpretation: Normal  sinus rhythm with sinus arrhythmia Normal ECG Confirmed by Elnora Morrison [phone]) on 02/22/2021 7:09:07 AM  Radiology MR BRAIN WO CONTRAST  Result Date: 02/22/2021 CLINICAL DATA:  History of CVA 1 month ago, now presenting with acute onset bilateral lower extremity weakness EXAM: MRI HEAD WITHOUT CONTRAST TECHNIQUE: Multiplanar, multiecho pulse sequences of the brain and surrounding structures were obtained without intravenous contrast. COMPARISON:  MR head 01/17/2021 FINDINGS: Brain: There is no diffusion restriction on the current study to suggest acute infarct. There is no evidence of acute intracranial hemorrhage or extra-axial fluid collection. There is unchanged multifocal encephalomalacia in the right frontal lobe consistent with prior infarcts. Additional remote infarcts are seen in the bilateral centrum semiovale, left basal ganglia, and left anteromedial frontal lobe. There is a background of moderate global parenchymal volume loss. Additional confluent FLAIR signal abnormality in the subcortical and periventricular white matter likely reflects sequela of chronic white matter microangiopathy. There is no solid mass lesion.  There is no midline shift. Vascular: The right V4 segment flow void is absent. The other major flow voids are present. Skull and upper cervical spine: Normal marrow signal. Sinuses/Orbits: The paranasal sinuses are clear. Bilateral lens implants are in place. The globes and orbits are otherwise unremarkable. Other: None. IMPRESSION: 1. No acute intracranial pathology. 2. Unchanged encephalomalacia in the right frontal lobe and multiple additional smaller remote infarcts as above. 3. Unchanged global parenchymal volume loss and chronic white matter microangiopathy. Electronically Signed   By: Valetta Mole M.D.   On: 02/22/2021 09:48    Procedures Procedures   Medications Ordered in ED Medications - No data to display  ED Course  I have reviewed the triage vital signs and  the nursing notes.  Pertinent labs & imaging results that were available during my care of the patient were reviewed by me and considered in my medical decision making (see chart for details).  Weakness of lower  extremities, differential would include recrudescence of her prior stroke versus new cerebrovascular accident versus deconditioning versus other.  I personally reviewed her prior medical records including her recent medical work-up and MRI imaging.  I reviewed and interpreted her work-up in the ER today including her blood test and MRI.  Blood tests are largely unremarkable.  Troponin was 6.  MRI scan showed no acute infiltrate.  Patient was able to ambulate to the bathroom steadily.  I thought he was reasonably safe for discharge.  Following her discharge her COVID test did return positive.  I reached out to the family members to let them know  Clinical Course as of 02/22/21 1437  Tue Feb 22, 2021  [id number]  IMPRESSION: 1. No acute intracranial pathology. 2. Unchanged encephalomalacia in the right frontal lobe and multiple additional smaller remote infarcts as above. 3. Unchanged global parenchymal volume loss and chronic white matter microangiopathy. [MT]  [phone] Patient ambulated steadily to the bathroom - MRI largely unchanged, suspect this is likely recrudescence of her prior stroke.  Okay for discharge - advised using walker [MT]  1433 Not eligible for paxlovid as she is on plavix [MT]  54 Left voicemail regarding covid diagnosis with niece who is listed as emergency contact and primary phone contact for patient [MT]    Clinical Course User Index [MT] Merrick Maggio, Carola Rhine, MD   Final Clinical Impression(s) / ED Diagnoses Final diagnoses:  Weakness of both lower extremities  COVID-19    Rx / DC Orders ED Discharge Orders     None        Wyvonnia Dusky, MD 02/22/21 1437

## 2021-02-22 NOTE — ED Notes (Signed)
Pt unable to provide urine specimen at this time. Pt transported to MRI

## 2021-03-14 ENCOUNTER — Ambulatory Visit: Payer: Medicare HMO

## 2021-03-14 DIAGNOSIS — Z1211 Encounter for screening for malignant neoplasm of colon: Secondary | ICD-10-CM | POA: Diagnosis not present

## 2021-03-14 DIAGNOSIS — F331 Major depressive disorder, recurrent, moderate: Secondary | ICD-10-CM | POA: Diagnosis not present

## 2021-03-14 DIAGNOSIS — I10 Essential (primary) hypertension: Secondary | ICD-10-CM | POA: Diagnosis not present

## 2021-03-14 DIAGNOSIS — F411 Generalized anxiety disorder: Secondary | ICD-10-CM | POA: Diagnosis not present

## 2021-03-14 DIAGNOSIS — N1832 Chronic kidney disease, stage 3b: Secondary | ICD-10-CM | POA: Diagnosis not present

## 2021-03-31 DIAGNOSIS — M47816 Spondylosis without myelopathy or radiculopathy, lumbar region: Secondary | ICD-10-CM | POA: Diagnosis not present

## 2021-03-31 DIAGNOSIS — I10 Essential (primary) hypertension: Secondary | ICD-10-CM | POA: Diagnosis not present

## 2021-04-06 DIAGNOSIS — Z1211 Encounter for screening for malignant neoplasm of colon: Secondary | ICD-10-CM | POA: Diagnosis not present

## 2021-04-14 DIAGNOSIS — F411 Generalized anxiety disorder: Secondary | ICD-10-CM | POA: Diagnosis not present

## 2021-04-14 DIAGNOSIS — I10 Essential (primary) hypertension: Secondary | ICD-10-CM | POA: Diagnosis not present

## 2021-04-14 DIAGNOSIS — F331 Major depressive disorder, recurrent, moderate: Secondary | ICD-10-CM | POA: Diagnosis not present

## 2021-04-16 LAB — CUP PACEART REMOTE DEVICE CHECK
Date Time Interrogation Session: 20230210230655
Implantable Pulse Generator Implant Date: 20220920

## 2021-04-18 ENCOUNTER — Ambulatory Visit (INDEPENDENT_AMBULATORY_CARE_PROVIDER_SITE_OTHER): Payer: Medicare HMO

## 2021-04-18 DIAGNOSIS — K219 Gastro-esophageal reflux disease without esophagitis: Secondary | ICD-10-CM | POA: Diagnosis not present

## 2021-04-18 DIAGNOSIS — E7800 Pure hypercholesterolemia, unspecified: Secondary | ICD-10-CM | POA: Diagnosis not present

## 2021-04-18 DIAGNOSIS — F332 Major depressive disorder, recurrent severe without psychotic features: Secondary | ICD-10-CM | POA: Diagnosis not present

## 2021-04-18 DIAGNOSIS — F411 Generalized anxiety disorder: Secondary | ICD-10-CM | POA: Diagnosis not present

## 2021-04-18 DIAGNOSIS — Z79899 Other long term (current) drug therapy: Secondary | ICD-10-CM | POA: Diagnosis not present

## 2021-04-18 DIAGNOSIS — I639 Cerebral infarction, unspecified: Secondary | ICD-10-CM

## 2021-04-18 DIAGNOSIS — Z7902 Long term (current) use of antithrombotics/antiplatelets: Secondary | ICD-10-CM | POA: Diagnosis not present

## 2021-04-18 DIAGNOSIS — I129 Hypertensive chronic kidney disease with stage 1 through stage 4 chronic kidney disease, or unspecified chronic kidney disease: Secondary | ICD-10-CM | POA: Diagnosis not present

## 2021-04-18 DIAGNOSIS — M47816 Spondylosis without myelopathy or radiculopathy, lumbar region: Secondary | ICD-10-CM | POA: Diagnosis not present

## 2021-04-18 DIAGNOSIS — N1832 Chronic kidney disease, stage 3b: Secondary | ICD-10-CM | POA: Diagnosis not present

## 2021-04-19 LAB — COLOGUARD: COLOGUARD: POSITIVE — AB

## 2021-04-20 NOTE — Progress Notes (Signed)
Carelink Summary Report / Loop Recorder 

## 2021-04-28 DIAGNOSIS — F411 Generalized anxiety disorder: Secondary | ICD-10-CM | POA: Diagnosis not present

## 2021-04-28 DIAGNOSIS — M545 Low back pain, unspecified: Secondary | ICD-10-CM | POA: Diagnosis not present

## 2021-04-28 DIAGNOSIS — Z8673 Personal history of transient ischemic attack (TIA), and cerebral infarction without residual deficits: Secondary | ICD-10-CM | POA: Diagnosis not present

## 2021-04-28 DIAGNOSIS — I10 Essential (primary) hypertension: Secondary | ICD-10-CM | POA: Diagnosis not present

## 2021-04-28 DIAGNOSIS — F331 Major depressive disorder, recurrent, moderate: Secondary | ICD-10-CM | POA: Diagnosis not present

## 2021-05-04 ENCOUNTER — Encounter: Payer: Self-pay | Admitting: Adult Health

## 2021-05-04 ENCOUNTER — Ambulatory Visit: Payer: Medicare HMO | Admitting: Adult Health

## 2021-05-04 VITALS — BP 120/56 | HR 70 | Ht 66.0 in | Wt 169.0 lb

## 2021-05-04 DIAGNOSIS — I639 Cerebral infarction, unspecified: Secondary | ICD-10-CM | POA: Diagnosis not present

## 2021-05-04 DIAGNOSIS — Z09 Encounter for follow-up examination after completed treatment for conditions other than malignant neoplasm: Secondary | ICD-10-CM | POA: Diagnosis not present

## 2021-05-04 MED ORDER — CLOPIDOGREL BISULFATE 75 MG PO TABS: 75.0000 mg | ORAL_TABLET | Freq: Every day | ORAL | 3 refills | Status: DC

## 2021-05-04 MED ORDER — PANTOPRAZOLE SODIUM 40 MG PO TBEC: 40.0000 mg | DELAYED_RELEASE_TABLET | Freq: Every day | ORAL | 3 refills | Status: DC

## 2021-05-04 NOTE — Patient Instructions (Signed)
Stop aspirin and restart Plavix and continue atorvastatin  for secondary stroke prevention ? ?Stop nexium and start protonix for acid reflux  ? ?Continue use of cane at all times unles otherwise instructed - gradually increase your day time activity and start a routine exercise regimen  ? ?Loop recorder will continue to be monitored for atrial fibrillation ? ?Continue to follow up with PCP regarding cholesterol and blood pressure management  ?Maintain strict control of hypertension with blood pressure goal below 130/90 and cholesterol with LDL cholesterol (bad cholesterol) goal below 70 mg/dL.  ? ?Signs of a Stroke? Follow the BEFAST method:  ?Balance Watch for a sudden loss of balance, trouble with coordination or vertigo ?Eyes Is there a sudden loss of vision in one or both eyes? Or double vision?  ?Face: Ask the person to smile. Does one side of the face droop or is it numb?  ?Arms: Ask the person to raise both arms. Does one arm drift downward? Is there weakness or numbness of a leg? ?Speech: Ask the person to repeat a simple phrase. Does the speech sound slurred/strange? Is the person confused ? ?Time: If you observe any of these signs, call 911. ? ? ? ?Followup in the future with me in 6 months or call earlier if needed ? ? ? ? ? ? ?Thank you for coming to see [place] at [facility]. I hope we have been able to provide you high quality care today. ? ?You may receive a patient satisfaction survey over the next few weeks. We would appreciate your feedback and comments so that we may continue to improve ourselves and the health of our patients. ? ?

## 2021-05-04 NOTE — Progress Notes (Signed)
[facility] [address] [place]. [place] [address] [phone]       STROKE FOLLOW UP NOTE  Ariel [name] Date of Birth:  03-25-1945 Medical Record Number:  [id number]   Reason for Referral: stroke follow up    SUBJECTIVE:   CHIEF COMPLAINT:  Chief Complaint  Patient presents with   Follow-up    RM 3 wiith niece sharon  Pt is well,     HPI:   Update 05/04/2021 JM: Patient returns for stroke follow-up.  Previously seen 7 months ago, did not have 3 mo f/u as recommended.  She is accompanied today by her niece.  Unfortunately, she returned to ED 01/17/2021 for c/o bilateral lower extremity weakness upon awakening.  MRI showed left anterior frontal lobe infarct likely due to large vessel disease given severe ACA stenosis as noted on MRA.  MRA showed diffuse multifocal narrowing (right PCA, moderate stenosis in bilateral MCA, left PCA and basilar artery). EF 60 to 65%. MR cervical showed C6-C7 severe left and moderate to severe right neural foraminal narrowing and recommended neurosurgery evaluation.  LDL 57.  A1c 5.9.  Recommended DAPT for 3 months then Plavix alone and resumed home dose atorvastatin 80 mg daily. She did have ILR placed 11/2020 by Dr. Curt Bears - has not shown atrial fibrillation thus far. She did return on 12/19 for numbness and weakness in both legs and vomiting. MR brain negative for acute abnormality.  Did test positive for COVID.  Currently, still reports bilateral leg weakness although some improvement since discharge. Was working with therapy but not currently. Niece is looking at getting her in to a gym program that specializes with seniors. Per niece, does not move as much as she should. Will shuffle feet but if she is told to pick her feet up, she can very easily. Continued use of cane - no recent falls.  No new stroke/TIA symptoms.  Remain on Plavix for only 30 days as was only provided 30 days at d/c. She is currently on aspirin as well as  atorvastatin without side effects. Blood pressure today 120/56. Does not routinely monitor at home.  No further concerns at this time.       History provided for reference purposes only Initial visit 10/07/2020 JM: Ariel [name] is being seen for hospital follow-up accompanied by her niece, Ivin Booty.  Reports residual left-sided weakness and gait impairment.  She also reports chronic swallowing difficulties with larger pills which slightly worsened since her stroke.  Denies difficulty swallowing food or liquid.  PCP recently placed order for [facility] OT/SLP but not yet started.  She continues to work with [facility] PT. Ambulates with RW - using approx 1 month prior to her stroke.  Denies any recent falls.  Moved in with her niece around March -continues to reside with her niece who assists as needed.  Denies new stroke/TIA symptoms.  Compliant on aspirin and atorvastatin -denies side effects.  Blood pressure today 120/69.  Routinely monitored at home - stable.  30-day cardiac monitor completed - neg for A. Fib.  No further concerns at this time.  Stroke admission 08/27/2020 Ariel [name] is a 76 y.o. female with multiple previous strokes, CAD, former tobacco use, medication noncompliance, dm2 and debility who presented on 08/27/2020 with worsening left-sided weakness.  Personally reviewed hospitalization pertinent progress notes, lab work and imaging with summary provided.  Stroke work-up -multiple scattered small punctate infarcts bilaterally, etiology unclear but likely cardioembolic.  Recommended 30 cardiac event monitor  outpatient - if neg, consider ILR.  EF 60%.  CTA head/neck severe generalized intracranial arthrosclerosis (mostly R PCA and bilat ACAs, LICA 93%).  Remote right greater than left bifrontal infarcts on MRI.  LDL 144.  A1c 6.1.  Initiated DAPT for 3 weeks and aspirin as well as atorvastatin 40 mg daily.  Resumed home BP meds of amlodipine and Coreg.  Therapies recommended HH PT/OT.      ROS:   14  system review of systems performed and negative with exception of those listed in HPI  PMH:  Past Medical History:  Diagnosis Date   Hypertension    Stroke ([place])     PSH:  Past Surgical History:  Procedure Laterality Date   TUBAL LIGATION     Performed at the age of 76 years old    Social History:  Social History   Socioeconomic History   Marital status: Widowed    Spouse name: Not on file   Number of children: Not on file   Years of education: Not on file   Highest education level: Not on file  Occupational History   Not on file  Tobacco Use   Smoking status: Never   Smokeless tobacco: Never  Vaping Use   Vaping Use: Never used  Substance and Sexual Activity   Alcohol use: Never   Drug use: Never   Sexual activity: Not on file  Other Topics Concern   Not on file  Social History Narrative   Not on file   Social Determinants of Health   Financial Resource Strain: Not on file  Food Insecurity: Not on file  Transportation Needs: Not on file  Physical Activity: Not on file  Stress: Not on file  Social Connections: Not on file  Intimate Partner Violence: Not on file    Family History:  Family History  Problem Relation Age of Onset   COPD Mother    Heart disease Father    CVA Maternal Grandmother    Heart disease Paternal Grandmother    CVA Paternal Grandfather     Medications:   Current Outpatient Medications on File Prior to Visit  Medication Sig Dispense Refill   amLODipine (NORVASC) 10 MG tablet Take 10 mg by mouth at bedtime.     aspirin 325 MG tablet Take 1 tablet (325 mg total) by mouth daily. 90 tablet 0   atorvastatin (LIPITOR) 80 MG tablet Take 80 mg by mouth every evening.     carvedilol (COREG) 3.125 MG tablet Take 1 tablet (3.125 mg total) by mouth 2 (two) times daily. (Patient taking differently: Take 3.125 mg by mouth 2 (two) times daily. On hold)     esomeprazole (NEXIUM) 40 MG capsule Take 40 mg by mouth daily at 12 noon.      fenofibrate micronized (LOFIBRA) 200 MG capsule Take 200 mg by mouth at bedtime.     fexofenadine (ALLEGRA) 180 MG tablet Take 180 mg by mouth daily as needed for allergies.     fluticasone (FLONASE) 50 MCG/ACT nasal spray Place 1 spray into both nostrils as needed.     gabapentin (NEURONTIN) 100 MG capsule Take 100 mg by mouth 3 (three) times daily as needed (pain).     melatonin 5 MG TABS Take 5 mg by mouth at bedtime as needed.     Phenylephrine-DM-GG-APAP (DELSYM COUGH/COLD DAYTIME) 5-10-200-325 MG/10ML LIQD Take 10 mLs by mouth daily as needed (cough).     No current facility-administered medications on file prior to visit.  Allergies:   Allergies  Allergen Reactions   Lisinopril     Other reaction(s): Cough   Perflutren Lipid Microspheres     Other reaction(s): Abdominal Pain, Muscle Pain      OBJECTIVE:  Physical Exam  Vitals:   05/04/21 1512  BP: (!) 120/56  Pulse: 70  Weight: 169 lb (76.7 kg)  Height: 5\' 6"  (1.676 m)    Body mass index is 27.28 kg/m. No results found.  General: well developed, well nourished, very pleasant elderly Caucasian female, seated, in no evident distress Head: head normocephalic and atraumatic.   Neck: supple with no carotid or supraclavicular bruits Cardiovascular: regular rate and rhythm, no murmurs Musculoskeletal: no deformity Skin:  no rash/petichiae Vascular:  Normal pulses all extremities   Neurologic Exam Mental Status: Awake and fully alert.  Fluent speech and language.  Oriented to place and time. Recent and remote memory intact. Attention span, concentration and fund of knowledge appropriate. Mood and affect appropriate.  Cranial Nerves: Pupils equal, briskly reactive to light. Extraocular movements full without nystagmus. Visual fields full to confrontation. Hearing intact. Facial sensation intact.  Mild right facial asymmetry.  Tongue and palate moves normally and symmetrically.  Motor: Normal bulk and tone. Normal  strength in all tested extremity muscles except slight decrease left hand fine motor dexterity. Unable to appreciate weakness in lower extremities bilaterally Sensory.: intact to touch , pinprick , position and vibratory sensation.  Coordination: Rapid alternating movements normal in all extremities except decreased left hand. Finger-to-nose and heel-to-shin performed accurately bilaterally.  Orbits right arm over left arm. Gait and Station: Arises from chair without difficulty. Stance is hunched.  Gait demonstrates decreased step length and step height with use of quad cane and mild unsteadiness.  Tandem walking heel toe not attempted. Reflexes: 1+ and symmetric. Toes downgoing.          ASSESSMENT: Ariel [name] is a 76 y.o. 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  Vascular risk factors include HTN, HLD, severe intracranial atherosclerosis, carotid stenosis, hx of prior strokes (per imaging, remote R>L bifrontal infarcts), CAD, advanced age and former tobacco use.      PLAN:  Recurrent strokes:  Residual deficit: Mild LUE weakness, subjective BLE weakness and gait impairment.  Continued use of cane at all times unless otherwise indicated.  Increase activity at home with plans on starting exercise program. Chronic difficulty swallowing pills slightly worsened post stroke - MBS 12/2020 adequate swallowing function with all textures although noted difficulty swallowing pills and recommended avoidance of large pills. Stable  S/p ILR 11/23/2020 by Dr. Curt Bears. Has not shown atrial fibrillation thus far Stop aspirin and restart Plavix 75 mg daily as recent stroke occurred despite aspirin use and continue atorvastatin 80 mg daily for secondary stroke prevention.  Advised to stop Nexium  and transition to pantoprazole due to interaction between Plavix and Nexium.  Refills provided but request ongoing refills by PCP as these medications will be continued life long unless contraindicated in the future. Discussed secondary stroke prevention measures and importance of close PCP follow up for aggressive stroke risk factor management including HTN with BP goal normotensive range and avoidance of hypotension due to diffuse intracranial stenosis, HLD with LDL goal<70 and pre-DM with A1c goal<7. I have gone over the pathophysiology of stroke, warning signs and symptoms, risk  factors and their management in some detail with instructions to go to the closest emergency room for symptoms of concern.     Follow up in 6 months or call earlier if needed   CC:  PCP: Willeen Niece, PA    I spent 53 minutes of face-to-face and non-face-to-face time with patient and niece.  This included previsit chart review including review of recent hospitalization, lab review, study review, order entry, electronic health record documentation, patient and niece education regarding recurrent strokes with residual deficits, secondary stroke prevention measures and importance of managing stroke risk factors, and answered all other questions to patient and nieces satisfaction  Frann Rider, AGNP-BC  [facility] [address] [place], [place] [address]  Phone [phone] Fax [phone] Note: This document was prepared with digital dictation and possible smart phrase technology. Any transcriptional errors that result from this process are unintentional.

## 2021-05-17 DIAGNOSIS — M25559 Pain in unspecified hip: Secondary | ICD-10-CM | POA: Diagnosis not present

## 2021-05-17 DIAGNOSIS — I10 Essential (primary) hypertension: Secondary | ICD-10-CM | POA: Diagnosis not present

## 2021-05-23 ENCOUNTER — Ambulatory Visit: Payer: Medicare HMO

## 2021-06-27 ENCOUNTER — Ambulatory Visit (INDEPENDENT_AMBULATORY_CARE_PROVIDER_SITE_OTHER): Payer: Medicare HMO

## 2021-06-27 DIAGNOSIS — I639 Cerebral infarction, unspecified: Secondary | ICD-10-CM

## 2021-06-27 LAB — CUP PACEART REMOTE DEVICE CHECK
Date Time Interrogation Session: 20230421230416
Implantable Pulse Generator Implant Date: 20220920

## 2021-07-12 NOTE — Progress Notes (Signed)
Carelink Summary Report / Loop Recorder 

## 2021-07-13 DIAGNOSIS — Z8673 Personal history of transient ischemic attack (TIA), and cerebral infarction without residual deficits: Secondary | ICD-10-CM | POA: Diagnosis not present

## 2021-07-13 DIAGNOSIS — I10 Essential (primary) hypertension: Secondary | ICD-10-CM | POA: Diagnosis not present

## 2021-07-27 DIAGNOSIS — Y93E9 Activity, other interior property and clothing maintenance: Secondary | ICD-10-CM | POA: Diagnosis not present

## 2021-07-27 DIAGNOSIS — Y93E8 Activity, other personal hygiene: Secondary | ICD-10-CM | POA: Diagnosis not present

## 2021-07-27 DIAGNOSIS — N1832 Chronic kidney disease, stage 3b: Secondary | ICD-10-CM | POA: Diagnosis not present

## 2021-07-27 DIAGNOSIS — I10 Essential (primary) hypertension: Secondary | ICD-10-CM | POA: Diagnosis not present

## 2021-07-27 DIAGNOSIS — R7303 Prediabetes: Secondary | ICD-10-CM | POA: Diagnosis not present

## 2021-07-27 DIAGNOSIS — F331 Major depressive disorder, recurrent, moderate: Secondary | ICD-10-CM | POA: Diagnosis not present

## 2021-07-27 DIAGNOSIS — Z8673 Personal history of transient ischemic attack (TIA), and cerebral infarction without residual deficits: Secondary | ICD-10-CM | POA: Diagnosis not present

## 2021-07-27 DIAGNOSIS — E785 Hyperlipidemia, unspecified: Secondary | ICD-10-CM | POA: Diagnosis not present

## 2021-07-27 DIAGNOSIS — R350 Frequency of micturition: Secondary | ICD-10-CM | POA: Diagnosis not present

## 2021-07-30 LAB — CUP PACEART REMOTE DEVICE CHECK
Date Time Interrogation Session: 20230524054803
Implantable Pulse Generator Implant Date: 20220920

## 2021-08-02 ENCOUNTER — Telehealth: Payer: Self-pay

## 2021-08-02 ENCOUNTER — Ambulatory Visit (INDEPENDENT_AMBULATORY_CARE_PROVIDER_SITE_OTHER): Payer: Medicare HMO

## 2021-08-02 DIAGNOSIS — I639 Cerebral infarction, unspecified: Secondary | ICD-10-CM

## 2021-08-02 NOTE — Telephone Encounter (Signed)
Called patient to advise needs AF referral per Dr. Curt Bears. No answer, mailbox Is full and unable to accept any messages at this time.

## 2021-08-02 NOTE — Telephone Encounter (Signed)
ILR summary report received. Battery status OK. Normal device function. No new symptom, tachy, brady, or pause episodes. One new AF episode that was 4 hours and 38 minutes. NO OAC, sent to triage.

## 2021-08-04 NOTE — Telephone Encounter (Signed)
LVM to have patient call device clinic back direct number given.

## 2021-08-08 NOTE — Telephone Encounter (Signed)
Appt made

## 2021-08-08 NOTE — Telephone Encounter (Signed)
Spoke to patients niece Ivin Booty, who is on [place]. Advised of Dr. Curt Bears recommendations and agreeable for AF clinic referral. Appreciative of call.

## 2021-08-11 ENCOUNTER — Ambulatory Visit (HOSPITAL_COMMUNITY)
Admission: RE | Admit: 2021-08-11 | Discharge: 2021-08-11 | Disposition: A | Discharge status: Home / Self Care | Payer: Medicare HMO | Source: Ambulatory Visit | Attending: Physician Assistant | Admitting: Physician Assistant

## 2021-08-11 VITALS — BP 118/60 | HR 55 | Ht 66.0 in | Wt 169.4 lb

## 2021-08-11 DIAGNOSIS — D6869 Other thrombophilia: Secondary | ICD-10-CM | POA: Diagnosis not present

## 2021-08-11 DIAGNOSIS — Z7902 Long term (current) use of antithrombotics/antiplatelets: Secondary | ICD-10-CM | POA: Diagnosis not present

## 2021-08-11 DIAGNOSIS — I480 Paroxysmal atrial fibrillation: Secondary | ICD-10-CM | POA: Insufficient documentation

## 2021-08-11 DIAGNOSIS — Z7901 Long term (current) use of anticoagulants: Secondary | ICD-10-CM | POA: Insufficient documentation

## 2021-08-11 DIAGNOSIS — I10 Essential (primary) hypertension: Secondary | ICD-10-CM | POA: Insufficient documentation

## 2021-08-11 DIAGNOSIS — Z7982 Long term (current) use of aspirin: Secondary | ICD-10-CM | POA: Diagnosis not present

## 2021-08-11 DIAGNOSIS — Z79899 Other long term (current) drug therapy: Secondary | ICD-10-CM | POA: Insufficient documentation

## 2021-08-11 DIAGNOSIS — Z8673 Personal history of transient ischemic attack (TIA), and cerebral infarction without residual deficits: Secondary | ICD-10-CM | POA: Insufficient documentation

## 2021-08-11 MED ORDER — APIXABAN 5 MG PO TABS: 5.0000 mg | ORAL_TABLET | Freq: Two times a day (BID) | ORAL | 3 refills | Status: DC

## 2021-08-11 NOTE — Patient Instructions (Signed)
Stop aspirin  Start Eliquis '5mg'$  twice a day  We will notify regarding Plavix once Audry Pili has spoken to neuro

## 2021-08-11 NOTE — Progress Notes (Signed)
Primary Care Physician: Ariel Niece, PA Primary Cardiologist: none Primary Electrophysiologist: Ariel [name] Referring Physician: Dr [name] is a 76 y.o. female with a history of CVA, HTN, atrial fibrillation who presents for follow up in the [facility]. Patient is s/p multiple CVA/TIA events and had ILR placed  11/2020. The device clinic received an alert for an afib episode on 07/27/21 which lasted 4.5 hours. Patient was asymptomatic during that episode. Patient has a CHADS2VASC score of 5.  Today, she denies symptoms of palpitations, chest pain, shortness of breath, orthopnea, PND, lower extremity edema, dizziness, presyncope, syncope, snoring, daytime somnolence, bleeding, or neurologic sequela. The patient is tolerating medications without difficulties and is otherwise without complaint today.    Atrial Fibrillation Risk Factors:  she does not have symptoms or diagnosis of sleep apnea. she does not have a history of rheumatic fever.   she has a BMI of Body mass index is 27.34 kg/m.Marland Kitchen Filed Weights   08/11/21 1126  Weight: 76.8 kg    Family History  Problem Relation Age of Onset   COPD Mother    Heart disease Father    CVA Maternal Grandmother    Heart disease Paternal Grandmother    CVA Paternal Grandfather      Atrial Fibrillation Management history:  Previous antiarrhythmic drugs: none Previous cardioversions: none Previous ablations: none CHADS2VASC score: 5 Anticoagulation history: none   Past Medical History:  Diagnosis Date   Hypertension    Stroke [facility])    Past Surgical History:  Procedure Laterality Date   TUBAL LIGATION     Performed at the age of 76 years old    Current Outpatient Medications  Medication Sig Dispense Refill   amLODipine (NORVASC) 10 MG tablet Take 10 mg by mouth at bedtime.     apixaban (ELIQUIS) 5 MG TABS tablet Take 1 tablet (5 mg total) by mouth 2 (two) times daily. 60 tablet 3    atorvastatin (LIPITOR) 80 MG tablet Take 80 mg by mouth every evening.     buPROPion (WELLBUTRIN XL) 300 MG 24 hr tablet Take 300 mg by mouth daily.     clopidogrel (PLAVIX) 75 MG tablet Take 1 tablet (75 mg total) by mouth daily. 90 tablet 3   fenofibrate micronized (LOFIBRA) 200 MG capsule Take 200 mg by mouth at bedtime.     fexofenadine (ALLEGRA) 180 MG tablet Take 180 mg by mouth daily as needed for allergies.     fluticasone (FLONASE) 50 MCG/ACT nasal spray Place 1 spray into both nostrils as needed.     gabapentin (NEURONTIN) 100 MG capsule Take 100 mg by mouth 3 (three) times daily as needed (pain).     hydrochlorothiazide (HYDRODIURIL) 12.5 MG tablet Take 12.5 mg by mouth daily.     losartan (COZAAR) 50 MG tablet TAKE 1 TABLET(50 MG) BY MOUTH DAILY     melatonin 5 MG TABS Take 5 mg by mouth at bedtime as needed.     ondansetron (ZOFRAN-ODT) 4 MG disintegrating tablet Take 4 mg by mouth every 8 (eight) hours as needed.     pantoprazole (PROTONIX) 40 MG tablet Take 1 tablet (40 mg total) by mouth daily. 90 tablet 3   Phenylephrine-DM-GG-APAP (DELSYM COUGH/COLD DAYTIME) 5-10-200-325 MG/10ML LIQD Take 10 mLs by mouth daily as needed (cough).     carvedilol (COREG) 3.125 MG tablet Take 1 tablet (3.125 mg total) by mouth 2 (two) times daily. (Patient not taking: Reported on 08/11/2021)  No current facility-administered medications for this encounter.    Allergies  Allergen Reactions   Lisinopril     Other reaction(s): Cough   Perflutren Lipid Microspheres     Other reaction(s): Abdominal Pain, Muscle Pain    Social History   Socioeconomic History   Marital status: Widowed    Spouse name: Not on file   Number of children: Not on file   Years of education: Not on file   Highest education level: Not on file  Occupational History   Not on file  Tobacco Use   Smoking status: Never   Smokeless tobacco: Never  Vaping Use   Vaping Use: Never used  Substance and Sexual Activity    Alcohol use: Never   Drug use: Never   Sexual activity: Not on file  Other Topics Concern   Not on file  Social History Narrative   Not on file   Social Determinants of Health   Financial Resource Strain: Not on file  Food Insecurity: Not on file  Transportation Needs: Not on file  Physical Activity: Not on file  Stress: Not on file  Social Connections: Not on file  Intimate Partner Violence: Not on file     ROS- All systems are reviewed and negative except as per the HPI above.  Physical Exam: Vitals:   08/11/21 1126  BP: 118/60  Pulse: (!) 55  Weight: 76.8 kg  Height: '5\' 6"'$  (1.676 m)    GEN- The patient is a well appearing elderly female, alert and oriented x 3 today.   Head- normocephalic, atraumatic Eyes-  Sclera clear, conjunctiva pink Ears- hearing intact Oropharynx- clear Neck- supple  Lungs- Clear to ausculation bilaterally, normal work of breathing Heart- Regular rate and rhythm, no murmurs, rubs or gallops  GI- soft, NT, ND, + BS Extremities- no clubbing, cyanosis, or edema MS- no significant deformity or atrophy Skin- no rash or lesion Psych- euthymic mood, full affect Neuro- strength and sensation are intact  Wt Readings from Last 3 Encounters:  08/11/21 76.8 kg  05/04/21 76.7 kg  02/22/21 78.9 kg    EKG today demonstrates  SB Vent. rate 55 BPM PR interval 168 ms QRS duration 90 ms QT/QTcB 416/397 ms  Echo 01/19/21 demonstrated   1. Left ventricular ejection fraction, by estimation, is 60 to 65%. The  left ventricle has normal function. The left ventricle has no regional  wall motion abnormalities. There is mild left ventricular hypertrophy.  Left ventricular diastolic parameters  are consistent with Grade I diastolic dysfunction (impaired relaxation).   2. Right ventricular systolic function is normal. The right ventricular  size is normal. There is normal pulmonary artery systolic pressure. The  estimated right ventricular systolic  pressure is 21.1 mmHg.   3. The mitral valve is normal in structure. Mild mitral valve  regurgitation. No evidence of mitral stenosis.   4. The aortic valve is tricuspid. Aortic valve regurgitation is not  visualized. No aortic stenosis is present.   5. The inferior vena cava is normal in size with greater than 50%  respiratory variability, suggesting right atrial pressure of 3 mmHg.   Epic records are reviewed at length today  CHA2DS2-VASc Score = 5  The patient's score is based upon: CHF History: 0 HTN History: 1 Diabetes History: 0 Stroke History: 2 Vascular Disease History: 0 Age Score: 1 Gender Score: 1       ASSESSMENT AND PLAN: 1. Paroxysmal Atrial Fibrillation (ICD10:  I48.0) The patient's CHA2DS2-VASc score is 5,  indicating a 7.2% annual risk of stroke.   General education about afib provided and questions answered. We also discussed her stroke risk and the risks and benefits of anticoagulation. Will start Eliquis 5 mg BID. Patient due for labs with PCP next week, will request copies once completed.  She currently takes ASA 81 mg daily and Plavix 75 mg daily. Will d/w neurology team if one or both should be discontinued. She did have small vessel ischemic disease noted on brain CT. Continue carvedilol 3.125 mg BID  2. Secondary Hypercoagulable State (ICD10:  D68.69) The patient is at significant risk for stroke/thromboembolism based upon her CHA2DS2-VASc Score of 5.  Start Apixaban (Eliquis).   3. HTN Stable, no changes today.    Follow up in the AF clinic in one month.    [facility] [address] [place], [place] [address] [phone] 08/11/2021 4:56 PM

## 2021-08-12 ENCOUNTER — Ambulatory Visit (HOSPITAL_COMMUNITY): Payer: Medicare HMO | Admitting: Physician Assistant

## 2021-08-15 ENCOUNTER — Other Ambulatory Visit (HOSPITAL_COMMUNITY): Payer: Self-pay | Admitting: *Deleted

## 2021-08-16 DIAGNOSIS — M545 Low back pain, unspecified: Secondary | ICD-10-CM | POA: Diagnosis not present

## 2021-08-16 DIAGNOSIS — M461 Sacroiliitis, not elsewhere classified: Secondary | ICD-10-CM | POA: Diagnosis not present

## 2021-08-16 DIAGNOSIS — Z79899 Other long term (current) drug therapy: Secondary | ICD-10-CM | POA: Diagnosis not present

## 2021-08-17 DIAGNOSIS — I10 Essential (primary) hypertension: Secondary | ICD-10-CM | POA: Diagnosis not present

## 2021-08-18 NOTE — Progress Notes (Signed)
Carelink Summary Report / Loop Recorder 

## 2021-09-05 ENCOUNTER — Ambulatory Visit (INDEPENDENT_AMBULATORY_CARE_PROVIDER_SITE_OTHER): Payer: Medicare HMO

## 2021-09-05 DIAGNOSIS — I6340 Cerebral infarction due to embolism of unspecified cerebral artery: Secondary | ICD-10-CM

## 2021-09-06 LAB — CUP PACEART REMOTE DEVICE CHECK
Date Time Interrogation Session: 20230702231138
Implantable Pulse Generator Implant Date: 20220920

## 2021-09-14 ENCOUNTER — Encounter (HOSPITAL_COMMUNITY): Payer: Self-pay

## 2021-09-14 ENCOUNTER — Ambulatory Visit (HOSPITAL_COMMUNITY): Payer: Medicare HMO | Admitting: Physician Assistant

## 2021-09-28 DIAGNOSIS — I10 Essential (primary) hypertension: Secondary | ICD-10-CM | POA: Diagnosis not present

## 2021-09-28 DIAGNOSIS — N1832 Chronic kidney disease, stage 3b: Secondary | ICD-10-CM | POA: Diagnosis not present

## 2021-09-28 DIAGNOSIS — I480 Paroxysmal atrial fibrillation: Secondary | ICD-10-CM | POA: Diagnosis not present

## 2021-09-29 ENCOUNTER — Telehealth: Payer: Self-pay | Admitting: *Deleted

## 2021-09-29 NOTE — Telephone Encounter (Signed)
   Pre-operative Risk Assessment    Patient Name: Ariel [name]  DOB: 11/15/1945 MRN: [id number]     Request for Surgical Clearance    Procedure:  Dental Extraction - Amount of Teeth to be Pulled:  SURGICAL EXTRACTION OF LAST REMAINING 7 TEETH  Date of Surgery:  Clearance TBD                                Surgeon:  DR. Veva Holes Surgeon's Group or Practice Name:  [facility]  Phone number:  [phone] Fax number:  [phone]   Type of Clearance Requested:   - Medical  - Pharmacy:  Hold Apixaban (Eliquis)     Type of Anesthesia:  Local    Additional requests/questions:    Jiles Prows   09/29/2021, 4:27 PM

## 2021-09-29 NOTE — Progress Notes (Signed)
Carelink Summary Report / Loop Recorder 

## 2021-09-30 NOTE — Telephone Encounter (Signed)
Patient with diagnosis of afib on Eliquis for anticoagulation.    Procedure: 7 dental extractions Date of procedure: TBD  CHA2DS2-VASc Score = 5  This indicates a 7.2% annual risk of stroke. The patient's score is based upon: CHF History: 0 HTN History: 1 Diabetes History: 0 Stroke History: 2 Vascular Disease History: 0 Age Score: 1 Gender Score: 1   CrCl 74m/min Platelet count 264K  Patient does not require pre-op antibiotics for dental procedure.  Per office protocol, patient can hold Eliquis for 1 day prior to procedure given hx of multiple CVAs. Resume as soon as safely possible after.    **This guidance is not considered finalized until pre-operative APP has relayed final recommendations.**

## 2021-09-30 NOTE — Telephone Encounter (Signed)
   Name: Ariel [name]  DOB: Apr 17, 1945  MRN: [id number]  Primary Cardiologist: None   Preoperative team, please contact this patient and set up a phone call appointment for further preoperative risk assessment. Please obtain consent and complete medication review. Thank you for your help.  I confirm that guidance regarding antiplatelet and oral anticoagulation therapy has been completed and, if necessary, noted below.  Patient with diagnosis of afib on Eliquis for anticoagulation.     Procedure: 7 dental extractions Date of procedure: TBD   CHA2DS2-VASc Score = 5  This indicates a 7.2% annual risk of stroke. The patient's score is based upon: CHF History: 0 HTN History: 1 Diabetes History: 0 Stroke History: 2 Vascular Disease History: 0 Age Score: 1 Gender Score: 1   CrCl 18m/min Platelet count 264K   Patient does not require pre-op antibiotics for dental procedure.   Per office protocol, patient can hold Eliquis for 1 day prior to procedure given hx of multiple CVAs. Resume as soon as safely possible after.    ELenna Sciara NP 09/30/2021, 4:21 PM [place] [address]

## 2021-10-03 ENCOUNTER — Telehealth: Payer: Self-pay | Admitting: *Deleted

## 2021-10-03 NOTE — Telephone Encounter (Signed)
I s/w the pt's niece Ivin Booty, [place], [place]. Ivin Booty agreeable to plan of care for tele pre op appt 10/10/21 @ 3 pm. Med rec and consent are done.

## 2021-10-03 NOTE — Telephone Encounter (Signed)
I s/w the pt's niece Ariel [name], Ariel [name], Ariel [name]. Ariel [name] agreeable to plan of care for tele pre op appt 10/10/21 @ 3 pm. Med rec and consent are done.     Patient Consent for Virtual Visit        Ariel [name] has provided verbal consent on 10/03/2021 for a virtual visit (video or telephone).   CONSENT FOR VIRTUAL VISIT FOR:  Ariel [name]  By participating in this virtual visit I agree to the following:  I hereby voluntarily request, consent and authorize [facility] and its employed or contracted physicians, physician assistants, nurse practitioners or other licensed health care professionals (the Practitioner), to provide me with telemedicine health care services (the "Services") as deemed necessary by the treating Practitioner. I acknowledge and consent to receive the Services by the Practitioner via telemedicine. I understand that the telemedicine visit will involve communicating with the Practitioner through live audiovisual communication technology and the disclosure of certain medical information by electronic transmission. I acknowledge that I have been given the opportunity to request an in-person assessment or other available alternative prior to the telemedicine visit and am voluntarily participating in the telemedicine visit.  I understand that I have the right to withhold or withdraw my consent to the use of telemedicine in the course of my care at any time, without affecting my right to future care or treatment, and that the Practitioner or I may terminate the telemedicine visit at any time. I understand that I have the right to inspect all information obtained and/or recorded in the course of the telemedicine visit and may receive copies of available information for a reasonable fee.  I understand that some of the potential risks of receiving the Services via telemedicine include:  Delay or interruption in medical evaluation due to technological equipment failure or disruption; Information  transmitted may not be sufficient (e.g. poor resolution of images) to allow for appropriate medical decision making by the Practitioner; and/or  In rare instances, security protocols could fail, causing a breach of personal health information.  Furthermore, I acknowledge that it is my responsibility to provide information about my medical history, conditions and care that is complete and accurate to the best of my ability. I acknowledge that Practitioner's advice, recommendations, and/or decision may be based on factors not within their control, such as incomplete or inaccurate data provided by me or distortions of diagnostic images or specimens that may result from electronic transmissions. I understand that the practice of medicine is not an exact science and that Practitioner makes no warranties or guarantees regarding treatment outcomes. I acknowledge that a copy of this consent can be made available to me via my patient portal ([facility]), or I can request a printed copy by calling the office of [facility].    I understand that my insurance will be billed for this visit.   I have read or had this consent read to me. I understand the contents of this consent, which adequately explains the benefits and risks of the Services being provided via telemedicine.  I have been provided ample opportunity to ask questions regarding this consent and the Services and have had my questions answered to my satisfaction. I give my informed consent for the services to be provided through the use of telemedicine in my medical care

## 2021-10-04 ENCOUNTER — Telehealth: Payer: Self-pay | Admitting: Cardiology

## 2021-10-04 NOTE — Telephone Encounter (Signed)
   Pre-operative Risk Assessment    Patient Name: SAMAH LAPIANA  DOB: 1945-11-21 MRN: [id number]     Request for Surgical Clearance    Procedure:  Dental Extraction - Amount of Teeth to be Pulled:  multiple  Date of Surgery:  Clearance TBD                                 Surgeon:    Dr. Veva Holes Surgeon's Group or Practice Name:  [facility] Phone number:  [phone] Fax number:  [phone]   Type of Clearance Requested:   - Medical    Type of Anesthesia:  Local    Additional requests/questions:    Caller stated they will need to hold the patient's Eliquis   Signed, Heloise Beecham   10/04/2021, 4:28 PM

## 2021-10-04 NOTE — Telephone Encounter (Signed)
Preop Team, We are not able to provide blanket coverage. Please contact requesting office and asked for specific details surrounding the surgery.  Once details are received we will be able to provide recommendations for the upcoming procedure.  Thank you for your help.  [name]. Tikisha Molinaro NP-C     10/04/2021, 4:41 PM [facility] [address] Office [phone] Fax [phone]

## 2021-10-05 NOTE — Telephone Encounter (Signed)
Per Ubaldo Glassing at [facility],  Pt is having 7 teeth extracted and bony growth in palate.

## 2021-10-05 NOTE — Telephone Encounter (Signed)
Routed clearance back to requesting surgeon's office to make them aware that we needed detailed information regarding the procedure.

## 2021-10-09 NOTE — Progress Notes (Signed)
Virtual Visit via Telephone Note   Because of Kati Riggenbach Hitchcock's co-morbid illnesses, she is at least at moderate risk for complications without adequate follow up.  This format is felt to be most appropriate for this patient at this time.  The patient did not have access to video technology/had technical difficulties with video requiring transitioning to audio format only (telephone).  All issues noted in this document were discussed and addressed.  No physical exam could be performed with this format.  Please refer to the patient's chart for her consent to telehealth for [facility].  Evaluation Performed:  Preoperative cardiovascular risk assessment _____________   Date:  10/10/2021   Patient ID:  Ariel [name], DOB 1945/10/18, MRN [id number] Patient Location:  Home Provider location:   Office  Primary Care Provider:  Willeen Niece, [place] Primary Cardiologist:  None  Chief Complaint / Patient Profile   76 y.o. y/o female with a h/o CVA, hypertension, former tobacco abuse, type 2 diabetes, atrial fibrillation who is pending multiple dental extraction and presents today for telephonic preoperative cardiovascular risk assessment.  Past Medical History    Past Medical History:  Diagnosis Date   Hypertension    Stroke [facility])    Past Surgical History:  Procedure Laterality Date   TUBAL LIGATION     Performed at the age of 76 years old    Allergies  Allergies  Allergen Reactions   Lisinopril     Other reaction(s): Cough   Perflutren Lipid Microspheres     Other reaction(s): Abdominal Pain, Muscle Pain    History of Present Illness    Ariel [name] is a 76 y.o. female who presents via audio/video conferencing for a telehealth visit today.  Pt was last seen in cardiology clinic on 08/11/21 by Adline Peals, PA.  At that time HARSHIKA MAGO was doing well.  The patient is now pending procedure as outlined above. Since her last visit, she  denies chest pain, shortness of breath,  lower extremity edema, fatigue, palpitations, melena, hematuria, hemoptysis, diaphoresis, weakness, presyncope, syncope, orthopnea, and PND.  Home Medications    Prior to Admission medications   Medication Sig Start Date End Date Taking? Authorizing Provider  amLODipine (NORVASC) 10 MG tablet Take 10 mg by mouth at bedtime. 07/01/20 10/03/21  [provider]  apixaban (ELIQUIS) 5 MG TABS tablet Take 1 tablet (5 mg total) by mouth 2 (two) times daily. 08/11/21   Fenton, Clint R, PA  atorvastatin (LIPITOR) 80 MG tablet Take 80 mg by mouth every evening. 12/16/20   [provider]  buPROPion (WELLBUTRIN XL) 300 MG 24 hr tablet Take 300 mg by mouth daily. Patient not taking: Reported on 10/03/2021 06/14/21   [provider]  busPIRone (BUSPAR) 5 MG tablet Take 5 mg by mouth 2 (two) times daily as needed.    [provider]  carvedilol (COREG) 3.125 MG tablet Take 1 tablet (3.125 mg total) by mouth 2 (two) times daily. Patient not taking: Reported on 08/11/2021 01/20/21   Thurnell Lose, MD  cyanocobalamin (VITAMIN B12) 1000 MCG tablet Take 1,000 mcg by mouth daily.    [provider]  famotidine (PEPCID) 20 MG tablet Take 20 mg by mouth daily.    [provider]  fenofibrate micronized (LOFIBRA) 200 MG capsule Take 200 mg by mouth at bedtime.    [provider]  fexofenadine (ALLEGRA) 180 MG tablet Take 180 mg by mouth daily as needed for allergies.  [provider]  fluticasone (FLONASE) 50 MCG/ACT nasal spray Place 1 spray into both nostrils as needed. 11/03/13 10/03/21  [provider]  gabapentin (NEURONTIN) 100 MG capsule Take 100 mg by mouth 3 (three) times daily as needed (pain). 07/01/20 10/03/21  [provider]  hydrochlorothiazide (HYDRODIURIL) 12.5 MG tablet Take 12.5 mg by mouth daily. 08/03/21   [provider]  losartan (COZAAR) 50 MG tablet TAKE 1 TABLET(50 MG) BY MOUTH DAILY 06/20/21    [provider]  melatonin 5 MG TABS Take 5 mg by mouth at bedtime as needed.    [provider]  ondansetron (ZOFRAN-ODT) 4 MG disintegrating tablet Take 4 mg by mouth every 8 (eight) hours as needed. Patient not taking: Reported on 10/03/2021 07/27/21   [provider]  pantoprazole (PROTONIX) 40 MG tablet Take 1 tablet (40 mg total) by mouth daily. 05/04/21   Frann Rider, NP  Phenylephrine-DM-GG-APAP (DELSYM COUGH/COLD DAYTIME) 5-10-200-325 MG/10ML LIQD Take 10 mLs by mouth daily as needed (cough). Patient not taking: Reported on 10/03/2021    [provider]  sertraline (ZOLOFT) 100 MG tablet Take 200 mg by mouth daily.    [provider]    Physical Exam    Vital Signs:  Ariel [name] does not have vital signs available for review today.  Given telephonic nature of communication, physical exam is limited. AAOx3. NAD. Normal affect.  Speech and respirations are unlabored.  Accessory Clinical Findings    None  Assessment & Plan    1.  Preoperative Cardiovascular Risk Assessment: The patient is doing well from a cardiac perspective. Therefore, based on ACC/AHA guidelines, the patient would be at acceptable risk for the planned procedure without further cardiovascular testing. The patient was advised that if he develops new symptoms prior to surgery to contact our office to arrange for a follow-up visit, and he verbalized understanding. She admits that she does not do any regular physical activity except to shower and dress herself. She admits that she is able to walk and thinks that she could even run a short distance but chooses to be sedentary all day. According to the Revised Cardiac Risk Index (RCRI), her Perioperative Risk of Major Cardiac Event is (%): 0.9. Her Functional Capacity in METs is: 4.28 according to the [employer] Activity Status Index (DASI). I advised that in the future, if she is scheduled for any procedure that requires moderate or  general anesthesia that she should undergo stress testing. Patient does not require pre-op antibiotics for dental procedure. Per office protocol, patient can hold Eliquis for 1 day prior to procedure given hx of multiple CVAs. Resume as soon as safely possible after.    A copy of this note will be routed to requesting surgeon.  Time:   Today, I have spent 8 minutes with the patient with telehealth technology discussing medical history, symptoms, and management plan.     Emmaline Life, NP-C    10/10/2021, 3:06 PM [place] [address], [address] Office [phone] Fax [phone]

## 2021-10-10 ENCOUNTER — Ambulatory Visit (INDEPENDENT_AMBULATORY_CARE_PROVIDER_SITE_OTHER): Payer: Medicare HMO | Admitting: Nurse Practitioner

## 2021-10-10 ENCOUNTER — Encounter: Payer: Self-pay | Admitting: Nurse Practitioner

## 2021-10-10 ENCOUNTER — Ambulatory Visit (INDEPENDENT_AMBULATORY_CARE_PROVIDER_SITE_OTHER): Payer: Medicare HMO

## 2021-10-10 DIAGNOSIS — Z01810 Encounter for preprocedural cardiovascular examination: Secondary | ICD-10-CM | POA: Diagnosis not present

## 2021-10-10 DIAGNOSIS — I6340 Cerebral infarction due to embolism of unspecified cerebral artery: Secondary | ICD-10-CM | POA: Diagnosis not present

## 2021-10-10 LAB — CUP PACEART REMOTE DEVICE CHECK
Date Time Interrogation Session: 20230804231014
Implantable Pulse Generator Implant Date: 20220920

## 2021-10-13 DIAGNOSIS — I10 Essential (primary) hypertension: Secondary | ICD-10-CM | POA: Diagnosis not present

## 2021-10-13 DIAGNOSIS — I480 Paroxysmal atrial fibrillation: Secondary | ICD-10-CM | POA: Diagnosis not present

## 2021-10-13 DIAGNOSIS — Z0000 Encounter for general adult medical examination without abnormal findings: Secondary | ICD-10-CM | POA: Diagnosis not present

## 2021-10-24 DIAGNOSIS — K219 Gastro-esophageal reflux disease without esophagitis: Secondary | ICD-10-CM | POA: Diagnosis not present

## 2021-10-24 DIAGNOSIS — I482 Chronic atrial fibrillation, unspecified: Secondary | ICD-10-CM | POA: Diagnosis not present

## 2021-10-24 DIAGNOSIS — N1832 Chronic kidney disease, stage 3b: Secondary | ICD-10-CM | POA: Diagnosis not present

## 2021-10-24 DIAGNOSIS — D123 Benign neoplasm of transverse colon: Secondary | ICD-10-CM | POA: Diagnosis not present

## 2021-10-24 DIAGNOSIS — Z7901 Long term (current) use of anticoagulants: Secondary | ICD-10-CM | POA: Diagnosis not present

## 2021-10-24 DIAGNOSIS — Z79899 Other long term (current) drug therapy: Secondary | ICD-10-CM | POA: Diagnosis not present

## 2021-10-24 DIAGNOSIS — E1122 Type 2 diabetes mellitus with diabetic chronic kidney disease: Secondary | ICD-10-CM | POA: Diagnosis not present

## 2021-10-24 DIAGNOSIS — Z1211 Encounter for screening for malignant neoplasm of colon: Secondary | ICD-10-CM | POA: Diagnosis not present

## 2021-10-24 DIAGNOSIS — I480 Paroxysmal atrial fibrillation: Secondary | ICD-10-CM | POA: Diagnosis not present

## 2021-10-24 DIAGNOSIS — D124 Benign neoplasm of descending colon: Secondary | ICD-10-CM | POA: Diagnosis not present

## 2021-10-24 DIAGNOSIS — K635 Polyp of colon: Secondary | ICD-10-CM | POA: Diagnosis not present

## 2021-10-24 DIAGNOSIS — Z8673 Personal history of transient ischemic attack (TIA), and cerebral infarction without residual deficits: Secondary | ICD-10-CM | POA: Diagnosis not present

## 2021-10-24 DIAGNOSIS — D125 Benign neoplasm of sigmoid colon: Secondary | ICD-10-CM | POA: Diagnosis not present

## 2021-10-24 DIAGNOSIS — I129 Hypertensive chronic kidney disease with stage 1 through stage 4 chronic kidney disease, or unspecified chronic kidney disease: Secondary | ICD-10-CM | POA: Diagnosis not present

## 2021-10-27 DIAGNOSIS — I10 Essential (primary) hypertension: Secondary | ICD-10-CM | POA: Diagnosis not present

## 2021-10-27 DIAGNOSIS — F331 Major depressive disorder, recurrent, moderate: Secondary | ICD-10-CM | POA: Diagnosis not present

## 2021-10-27 DIAGNOSIS — I480 Paroxysmal atrial fibrillation: Secondary | ICD-10-CM | POA: Diagnosis not present

## 2021-11-09 ENCOUNTER — Encounter: Payer: Self-pay | Admitting: Adult Health

## 2021-11-09 ENCOUNTER — Ambulatory Visit: Payer: Medicare HMO | Admitting: Adult Health

## 2021-11-09 VITALS — BP 122/48 | HR 61 | Ht 66.0 in | Wt 162.4 lb

## 2021-11-09 DIAGNOSIS — I639 Cerebral infarction, unspecified: Secondary | ICD-10-CM | POA: Diagnosis not present

## 2021-11-09 NOTE — Progress Notes (Signed)
[facility] [address] [place]. [place] [address] [phone]       STROKE FOLLOW UP NOTE  [name] Date of Birth:  1945-12-20 Medical Record Number:  [id number]    Primary neurologist: Dr. Leonie Man Reason for Referral: stroke follow up    SUBJECTIVE:   CHIEF COMPLAINT:  Chief Complaint  Patient presents with   Follow-up    Pt is well. No concerns. Room 2 with [name]      HPI:   Update 11/09/2021 JM: Patient returns for 74-monthstroke follow-up accompanied by her [name].  She has been stable from a stroke standpoint without new stroke/TIA symptoms. Completed therapies, just returned back to the gym yesterday. Doesn't feel like she has any residual weakness currently. Did have a fall recently, scraped her shin but otherwise without significant injury, ambulates with a cane.   Loop recorder showed evidence of A-fib in 07/2021 and started on Eliquis 5 mg twice daily.  She has remained on Eliquis as well as atorvastatin without side effects.  Blood pressure 124/48.  Closely followed by PCP and cardiology.  [name] notes that [facility] with [facility] as [facility].  No further neurological concerns at this time    History provided for reference purposes only Update 05/04/2021 JM: Patient returns for stroke follow-up.  Previously seen 7 months ago, did not have 3 mo f/u as recommended.  She is accompanied today by her [name].  Unfortunately, she returned to ED 01/17/2021 for c/o bilateral lower extremity weakness upon awakening.  MRI showed left anterior frontal lobe infarct likely due to large vessel disease given severe ACA stenosis as noted on MRA.  MRA showed diffuse multifocal narrowing (right PCA, moderate stenosis in bilateral MCA, left PCA and basilar artery). EF 60 to 65%. MR cervical showed C6-C7 severe left and moderate to severe right neural foraminal narrowing  and recommended neurosurgery evaluation.  LDL 57.  A1c 5.9.  Recommended DAPT for 3 months then Plavix alone and resumed home dose atorvastatin 80 mg daily. She did have ILR placed 11/2020 by Dr. CCurt Bears- has not shown atrial fibrillation thus far. She did return on 12/19 for numbness and weakness in both legs and vomiting. MR brain negative for acute abnormality.  Did test positive for COVID.  Currently, still reports bilateral leg weakness although some improvement since discharge. Was working with therapy but not currently. [name] is looking at getting her in to a gym program that specializes with seniors. Per [name], does not move as much as she should. Will shuffle feet but if she is told to pick her feet up, she can very easily. Continued use of cane - no recent falls.  No new stroke/TIA symptoms.  Remain on Plavix for only 30 days as was only provided 30 days at d/c. She is currently on aspirin as well as atorvastatin without side effects. Blood pressure today 120/56. Does not routinely monitor at home.  No further concerns at this time.   Initial visit 10/07/2020 JM: Ariel [name] being seen for hospital follow-up accompanied by her [name], Ariel [name]  Reports residual left-sided weakness and gait impairment.  She also reports chronic swallowing difficulties with larger pills which slightly worsened since her stroke.  Denies difficulty swallowing food or liquid.  [facility] not yet [facility]  [facility] Ambulates with RW - using approx  1 month prior to her stroke.  Denies any recent falls.  Moved in with her [name] around March -continues to reside with her [name] who assists as needed.  Denies new stroke/TIA symptoms.  Compliant on aspirin and atorvastatin -denies side effects.  Blood pressure today 120/69.  Routinely monitored at home - stable.  30-day cardiac monitor completed - neg for A. Fib.  No further concerns at this time.  Stroke admission  08/27/2020 Ariel [name] is a 76 y.o. female with multiple previous strokes, CAD, former tobacco use, medication noncompliance, dm2 and debility who presented on 08/27/2020 with worsening left-sided weakness.  Personally reviewed hospitalization pertinent progress notes, lab work and imaging with summary provided.  Stroke work-up -multiple scattered small punctate infarcts bilaterally, etiology unclear but likely cardioembolic.  Recommended 30 cardiac event monitor outpatient - if neg, consider ILR.  EF 60%.  CTA head/neck severe generalized intracranial arthrosclerosis (mostly R PCA and bilat ACAs, LICA 40%).  Remote right greater than left bifrontal infarcts on MRI.  LDL 144.  A1c 6.1.  Initiated DAPT for 3 weeks and aspirin as well as atorvastatin 40 mg daily.  Resumed home BP meds of amlodipine and Coreg.  Therapies recommended HH PT/OT.      ROS:   14 system review of systems performed and negative with exception of those listed in HPI  PMH:  Past Medical History:  Diagnosis Date   Hypertension    Stroke ([place])     PSH:  Past Surgical History:  Procedure Laterality Date   TUBAL LIGATION     Performed at the age of 76 years old    Social History:  Social History   Socioeconomic History   Marital status: Widowed    Spouse name: Not on file   Number of children: Not on file   Years of education: Not on file   Highest education level: Not on file  Occupational History   Not on file  Tobacco Use   Smoking status: Never   Smokeless tobacco: Never  Vaping Use   Vaping Use: Never used  Substance and Sexual Activity   Alcohol use: Never   Drug use: Never   Sexual activity: Not on file  Other Topics Concern   Not on file  Social History Narrative   Not on file   Social Determinants of Health   Financial Resource Strain: Not on file  Food Insecurity: Not on file  Transportation Needs: Not on file  Physical Activity: Not on file  Stress: Not on file  Social  Connections: Not on file  Intimate Partner Violence: Not on file    Family History:  Family History  Problem Relation Age of Onset   COPD Mother    Heart disease Father    CVA Maternal Grandmother    Heart disease Paternal Grandmother    CVA Paternal Grandfather     Medications:   Current Outpatient Medications on File Prior to Visit  Medication Sig Dispense Refill   amlodipine-atorvastatin (CADUET) 10-10 MG tablet Take 1 tablet by mouth daily.     apixaban (ELIQUIS) 5 MG TABS tablet Take 1 tablet (5 mg total) by mouth 2 (two) times daily. 60 tablet 3   atorvastatin (LIPITOR) 80 MG tablet Take 80 mg by mouth every evening.     busPIRone (BUSPAR) 5 MG tablet Take 5 mg by mouth 2 (two) times daily as needed.     carvedilol (COREG) 3.125 MG tablet Take 1 tablet (3.125 mg total) by  mouth 2 (two) times daily.     cyanocobalamin (VITAMIN B12) 1000 MCG tablet Take 1,000 mcg by mouth daily.     famotidine (PEPCID) 20 MG tablet Take 20 mg by mouth daily.     gabapentin (NEURONTIN) 100 MG capsule Take 100 mg by mouth 3 (three) times daily.     losartan (COZAAR) 50 MG tablet TAKE 1 TABLET(50 MG) BY MOUTH DAILY     pantoprazole (PROTONIX) 40 MG tablet Take 1 tablet (40 mg total) by mouth daily. 90 tablet 3   sertraline (ZOLOFT) 100 MG tablet Take 200 mg by mouth daily.     buPROPion (WELLBUTRIN XL) 300 MG 24 hr tablet Take 300 mg by mouth daily. (Patient not taking: Reported on 10/03/2021)     fenofibrate micronized (LOFIBRA) 200 MG capsule Take 200 mg by mouth at bedtime. (Patient not taking: Reported on 11/09/2021)     fexofenadine (ALLEGRA) 180 MG tablet Take 180 mg by mouth daily as needed for allergies. (Patient not taking: Reported on 11/09/2021)     fluticasone (FLONASE) 50 MCG/ACT nasal spray Place 1 spray into both nostrils as needed.     hydrochlorothiazide (HYDRODIURIL) 12.5 MG tablet Take 12.5 mg by mouth daily. (Patient not taking: Reported on 11/09/2021)     melatonin 5 MG TABS Take 5 mg  by mouth at bedtime as needed. (Patient not taking: Reported on 11/09/2021)     ondansetron (ZOFRAN-ODT) 4 MG disintegrating tablet Take 4 mg by mouth every 8 (eight) hours as needed. (Patient not taking: Reported on 10/03/2021)     Phenylephrine-DM-GG-APAP (DELSYM COUGH/COLD DAYTIME) 5-10-200-325 MG/10ML LIQD Take 10 mLs by mouth daily as needed (cough). (Patient not taking: Reported on 10/03/2021)     No current facility-administered medications on file prior to visit.    Allergies:   Allergies  Allergen Reactions   Lisinopril     Other reaction(s): Cough   Perflutren Lipid Microspheres     Other reaction(s): Abdominal Pain, Muscle Pain      OBJECTIVE:  Physical Exam  Vitals:   11/09/21 1125  BP: (!) 122/48  Pulse: 61  Weight: 162 lb 6 oz (73.7 kg)  Height: '5\' 6"'$  (1.676 m)   Body mass index is 26.21 kg/m. No results found.  General: well developed, well nourished, very pleasant elderly Caucasian female, seated, in no evident distress Head: head normocephalic and atraumatic.   Neck: supple with no carotid or supraclavicular bruits Cardiovascular: regular rate and rhythm, no murmurs Musculoskeletal: no deformity Skin:  no rash/petichiae Vascular:  Normal pulses all extremities   Neurologic Exam Mental Status: Awake and fully alert.  Fluent speech and language.  Oriented to place and time. Recent and remote memory intact. Attention span, concentration and fund of knowledge appropriate. Mood and affect appropriate.  Cranial Nerves: Pupils equal, briskly reactive to light. Extraocular movements full without nystagmus. Visual fields full to confrontation. Hearing intact. Facial sensation intact.  Mild right facial asymmetry.  Tongue and palate moves normally and symmetrically.  Motor: Normal bulk and tone. Normal strength in all tested extremity muscles except slight decrease left hand fine motor dexterity. Unable to appreciate weakness in lower extremities bilaterally Sensory.:  intact to touch , pinprick , position and vibratory sensation.  Coordination: Rapid alternating movements normal in all extremities except decreased left hand. Finger-to-nose and heel-to-shin performed accurately bilaterally.  Orbits right arm over left arm. Gait and Station: Arises from chair without difficulty. Stance is hunched.  Gait demonstrates decreased step length and step height  with use of quad cane and mild unsteadiness.  Tandem walking heel toe not attempted. Reflexes: 1+ and symmetric. Toes downgoing.          ASSESSMENT: KYRAN WHITTIER is a 76 y.o. year old female with left anterior frontal lobe infarct 01/17/2021 likely due to large vessel disease with severe ACA stenosis and multiple scattered small punctate infarcts bilaterally 06/01/9240, embolic secondary to unknown etiology s/p ILR placement.  Vascular risk factors include hx of A fib 07/2021, HTN, HLD, severe intracranial atherosclerosis, carotid stenosis, hx of prior strokes (per imaging, remote R>L bifrontal infarcts), CAD, advanced age and former tobacco use.      PLAN:  Recurrent strokes:  Residual deficit: Mild LUE weakness.  Stable since prior visit.  Continued use of cane at all times unless otherwise indicated.  S/p ILR 11/23/2020 by Dr. Curt Bears.  Evidence of A-fib 07/2021 Continue Eliquis 5 mg twice daily and atorvastatin 80 mg daily for secondary stroke prevention measures and new dx of A-fib.   Discussed secondary stroke prevention measures and importance of close PCP follow up for aggressive stroke risk factor management including HTN with BP goal normotensive range and avoidance of hypotension due to diffuse intracranial stenosis, HLD with LDL goal<70 and pre-DM with A1c goal<7.  I have gone over the pathophysiology of stroke, warning signs and symptoms, risk factors and their management in some detail with instructions to go to the closest emergency room for symptoms of concern.   Overall stable from stroke  standpoint without further recommendations and risk factors being managed by PCP/cardiology as well as routine follow-up with [facility] neurology.  Recommend follow-up on an as-needed basis at this time.   CC:  PCP: Ariel Niece, PA    I spent 31 minutes of face-to-face and non-face-to-face time with patient and [name].  This included previsit chart review, lab review, study review, electronic health record documentation, patient and [name] education regarding above diagnoses and treatment plan and answered all other questions to patient and [name]'s satisfaction Frann Rider, [facility]  [facility] [address] [place], [place] [address]  Phone [phone] Fax [phone] Note: This document was prepared with digital dictation and possible smart phrase technology. Any transcriptional errors that result from this process are unintentional.

## 2021-11-09 NOTE — Patient Instructions (Addendum)
Continue Eliquis (apixaban) daily  and atorvastatin for secondary stroke prevention  Continue to follow with cardiology for atrial fibrillation and Eliquis management  Continue to follow up with PCP regarding set pressure and cholesterol management  Maintain strict control of hypertension with blood pressure goal below 130/90 and cholesterol with LDL cholesterol (bad cholesterol) goal below 70 mg/dL.   Signs of a Stroke? Follow the BEFAST method:  Balance Watch for a sudden loss of balance, trouble with coordination or vertigo Eyes Is there a sudden loss of vision in one or both eyes? Or double vision?  Face: Ask the person to smile. Does one side of the face droop or is it numb?  Arms: Ask the person to raise both arms. Does one arm drift downward? Is there weakness or numbness of a leg? Speech: Ask the person to repeat a simple phrase. Does the speech sound slurred/strange? Is the person confused ? Time: If you observe any of these signs, call 911.     Overall stable from stroke standpoint, can recommend follow up as needed at this time.      Thank you for coming to see [place] at [facility]. I hope we have been able to provide you high quality care today.  You may receive a patient satisfaction survey over the next few weeks. We would appreciate your feedback and comments so that we may continue to improve ourselves and the health of our patients.

## 2021-11-09 NOTE — Progress Notes (Signed)
Carelink Summary Report / Loop Recorder 

## 2021-11-10 ENCOUNTER — Other Ambulatory Visit (HOSPITAL_COMMUNITY): Payer: Self-pay

## 2021-11-10 MED ORDER — APIXABAN 5 MG PO TABS: 5.0000 mg | ORAL_TABLET | Freq: Two times a day (BID) | ORAL | 0 refills | Status: DC

## 2021-11-11 DIAGNOSIS — Z1231 Encounter for screening mammogram for malignant neoplasm of breast: Secondary | ICD-10-CM | POA: Diagnosis not present

## 2021-11-11 DIAGNOSIS — M810 Age-related osteoporosis without current pathological fracture: Secondary | ICD-10-CM | POA: Diagnosis not present

## 2021-11-11 DIAGNOSIS — Z13820 Encounter for screening for osteoporosis: Secondary | ICD-10-CM | POA: Diagnosis not present

## 2021-11-11 DIAGNOSIS — Z780 Asymptomatic menopausal state: Secondary | ICD-10-CM | POA: Diagnosis not present

## 2021-11-14 ENCOUNTER — Ambulatory Visit (INDEPENDENT_AMBULATORY_CARE_PROVIDER_SITE_OTHER): Payer: Medicare HMO

## 2021-11-14 DIAGNOSIS — I6340 Cerebral infarction due to embolism of unspecified cerebral artery: Secondary | ICD-10-CM | POA: Diagnosis not present

## 2021-11-15 LAB — CUP PACEART REMOTE DEVICE CHECK
Date Time Interrogation Session: 20230906231621
Implantable Pulse Generator Implant Date: 20220920

## 2021-11-30 DIAGNOSIS — I10 Essential (primary) hypertension: Secondary | ICD-10-CM | POA: Diagnosis not present

## 2021-11-30 DIAGNOSIS — Z23 Encounter for immunization: Secondary | ICD-10-CM | POA: Diagnosis not present

## 2021-11-30 DIAGNOSIS — F331 Major depressive disorder, recurrent, moderate: Secondary | ICD-10-CM | POA: Diagnosis not present

## 2021-11-30 DIAGNOSIS — M810 Age-related osteoporosis without current pathological fracture: Secondary | ICD-10-CM | POA: Diagnosis not present

## 2021-12-01 NOTE — Progress Notes (Signed)
Carelink Summary Report / Loop Recorder 

## 2021-12-14 LAB — CUP PACEART REMOTE DEVICE CHECK
Date Time Interrogation Session: 20231009231200
Implantable Pulse Generator Implant Date: 20220920

## 2021-12-19 ENCOUNTER — Ambulatory Visit (INDEPENDENT_AMBULATORY_CARE_PROVIDER_SITE_OTHER): Payer: Medicare HMO

## 2021-12-19 DIAGNOSIS — I6340 Cerebral infarction due to embolism of unspecified cerebral artery: Secondary | ICD-10-CM

## 2022-01-02 DIAGNOSIS — F331 Major depressive disorder, recurrent, moderate: Secondary | ICD-10-CM | POA: Diagnosis not present

## 2022-01-02 DIAGNOSIS — D6869 Other thrombophilia: Secondary | ICD-10-CM | POA: Diagnosis not present

## 2022-01-02 DIAGNOSIS — M810 Age-related osteoporosis without current pathological fracture: Secondary | ICD-10-CM | POA: Diagnosis not present

## 2022-01-02 DIAGNOSIS — I10 Essential (primary) hypertension: Secondary | ICD-10-CM | POA: Diagnosis not present

## 2022-01-05 NOTE — Progress Notes (Signed)
Carelink Summary Report / Loop Recorder 

## 2022-01-23 ENCOUNTER — Other Ambulatory Visit (HOSPITAL_COMMUNITY): Payer: Self-pay | Admitting: Physician Assistant

## 2022-01-23 ENCOUNTER — Ambulatory Visit (INDEPENDENT_AMBULATORY_CARE_PROVIDER_SITE_OTHER): Payer: Medicare HMO

## 2022-01-23 DIAGNOSIS — I6340 Cerebral infarction due to embolism of unspecified cerebral artery: Secondary | ICD-10-CM

## 2022-01-24 LAB — CUP PACEART REMOTE DEVICE CHECK
Date Time Interrogation Session: 20231119231215
Implantable Pulse Generator Implant Date: 20220920

## 2022-02-13 ENCOUNTER — Other Ambulatory Visit (HOSPITAL_COMMUNITY): Payer: Self-pay | Admitting: Physician Assistant

## 2022-02-19 ENCOUNTER — Other Ambulatory Visit: Payer: Self-pay | Admitting: Adult Health

## 2022-02-24 LAB — CUP PACEART REMOTE DEVICE CHECK
Date Time Interrogation Session: 20231222230152
Implantable Pulse Generator Implant Date: 20220920

## 2022-02-28 ENCOUNTER — Ambulatory Visit (INDEPENDENT_AMBULATORY_CARE_PROVIDER_SITE_OTHER): Payer: Medicare HMO

## 2022-02-28 DIAGNOSIS — I6340 Cerebral infarction due to embolism of unspecified cerebral artery: Secondary | ICD-10-CM | POA: Diagnosis not present

## 2022-03-07 NOTE — Progress Notes (Signed)
Carelink Summary Report / Loop Recorder 

## 2022-03-08 ENCOUNTER — Other Ambulatory Visit (HOSPITAL_COMMUNITY): Payer: Self-pay | Admitting: Physician Assistant

## 2022-03-21 ENCOUNTER — Telehealth (HOSPITAL_COMMUNITY): Payer: Self-pay | Admitting: *Deleted

## 2022-03-21 NOTE — Telephone Encounter (Signed)
Patients niece called stating patient is being followed cardiology/neurology at [facility]. Did not want to schedule follow up with Clint Fenton at this time.

## 2022-03-22 NOTE — Progress Notes (Signed)
Carelink Summary Report / Loop Recorder

## 2022-04-03 ENCOUNTER — Ambulatory Visit: Payer: Medicare HMO | Attending: Cardiology

## 2022-04-03 DIAGNOSIS — I6340 Cerebral infarction due to embolism of unspecified cerebral artery: Secondary | ICD-10-CM | POA: Diagnosis not present

## 2022-04-04 LAB — CUP PACEART REMOTE DEVICE CHECK
Date Time Interrogation Session: 20240128231343
Implantable Pulse Generator Implant Date: 20220920

## 2022-04-20 ENCOUNTER — Other Ambulatory Visit: Payer: Self-pay | Admitting: Adult Health

## 2022-05-08 ENCOUNTER — Ambulatory Visit (INDEPENDENT_AMBULATORY_CARE_PROVIDER_SITE_OTHER): Payer: Medicare HMO

## 2022-05-08 DIAGNOSIS — I6340 Cerebral infarction due to embolism of unspecified cerebral artery: Secondary | ICD-10-CM

## 2022-05-08 LAB — CUP PACEART REMOTE DEVICE CHECK
Date Time Interrogation Session: 20240303231532
Implantable Pulse Generator Implant Date: 20220920

## 2022-05-16 ENCOUNTER — Telehealth: Payer: Self-pay | Admitting: *Deleted

## 2022-05-16 ENCOUNTER — Telehealth: Payer: Self-pay | Admitting: Cardiology

## 2022-05-16 DIAGNOSIS — Z01810 Encounter for preprocedural cardiovascular examination: Secondary | ICD-10-CM | POA: Insufficient documentation

## 2022-05-16 NOTE — Telephone Encounter (Signed)
DR. Veva Holes office called back and did state they will be sedating the pt. IV sedation.

## 2022-05-16 NOTE — Telephone Encounter (Signed)
Einar Gip K17 minutes ago (2:48 PM)   AF Pt's niece Ivin Booty is returning call to schedule Pre-Op appt. Ivin Booty stated that she is hard to get in touch with and stated that if you need to go ahead and schedule a date and time then that would be fine with them. LVM about appt details if you do schedule the appt for them.

## 2022-05-16 NOTE — Telephone Encounter (Signed)
Pt's niece Ivin Booty is returning call to schedule Pre-Op appt. Ivin Booty stated that she is hard to get in touch with and stated that if you need to go ahead and schedule a date and time then that would be fine with them. LVM about appt details if you do schedule the appt for them.

## 2022-05-16 NOTE — Progress Notes (Deleted)
  Cardiology Office Note:    Date:  05/16/2022  ID:  Ariel [name], DOB 07-25-1945, MRN [id number] [place] Providers Cardiologist:  None { Click to update primary MD,subspecialty MD or APP then REFRESH:1}     Patient Profile:   Paroxysmal atrial fibrillation  TTE 01/19/21: EF 60-65, no RWMA, mild LVH, Gr 1 DD, NL RVSF, NL PASP (RVSP 27.6), mild MR, RAP 3 S/p multiple CVAs S/p ILR in 11/2020 Monitor 09/28/20: NSR, Rare PVC; syncopal episode correlates with NSR HR 70 Hypertension  Diabetes mellitus      History of Present Illness:   Ariel [name] is a 77 y.o. female who returns for surgical clearance. She needs oral surgery. Our PharmD has reviewed his chart and the recommendation is to hold Eliquis x 1 day prior to her surgery. She was last seen in the AFib Clinic 08/11/21. ***  ROS ***    Studies Reviewed:    EKG:  ***  ***  Risk Assessment/Calculations:   {Does this patient have ATRIAL FIBRILLATION?:[phone]} No BP recorded.  {Refresh Note OR Click here to enter BP  :1}***       Physical Exam:   VS:  LMP  (LMP Unknown)    Wt Readings from Last 3 Encounters:  11/09/21 162 lb 6 oz (73.7 kg)  08/11/21 169 lb 6.4 oz (76.8 kg)  05/04/21 169 lb (76.7 kg)    Physical Exam***    ASSESSMENT AND PLAN:   No problem-specific Assessment & Plan notes found for this encounter. ***    {Are you ordering a CV Procedure (e.g. stress test, cath, DCCV, TEE, etc)?   Press F2        :[id number]}  Dispo:  No follow-ups on file. Signed, Richardson Dopp, PA-C

## 2022-05-16 NOTE — Telephone Encounter (Signed)
   Name: Ariel [name]  DOB: 30-May-1945  MRN: [id number]  Primary Cardiologist: None  Chart reviewed as part of pre-operative protocol coverage. Because of Ariel [name]'s past medical history and time since last visit, she will require a follow-up in-office visit in order to better assess preoperative cardiovascular risk.  Pre-op covering staff: - Please schedule appointment and call patient to inform them. If patient already had an upcoming appointment within acceptable timeframe, please add "pre-op clearance" to the appointment notes so provider is aware. - Please contact requesting surgeon's office via preferred method (i.e, phone, fax) to inform them of need for appointment prior to surgery.  Patient does not require pre-op antibiotics for dental procedure.   Per office protocol, patient can hold Eliquis for 1 day prior to procedure given hx of multiple CVAs. Resume as soon as safely possible after.  Ariel [name], Ariel Nestle, NP  05/16/2022, 1:36 PM

## 2022-05-16 NOTE — Telephone Encounter (Signed)
Pharmacy please advise on holding Eliquis prior to removal of Tori (bony growth and palate) scheduled for TBD. Thank you.

## 2022-05-16 NOTE — Telephone Encounter (Signed)
Patient with diagnosis of afib on Eliquis for anticoagulation.    Procedure: removal of tori Date of procedure: TBD  CHA2DS2-VASc Score = 5  This indicates a 7.2% annual risk of stroke. The patient's score is based upon: CHF History: 0 HTN History: 1 Diabetes History: 0 Stroke History: 2 Vascular Disease History: 0 Age Score: 1 Gender Score: 1   CrCl 49m/min Platelet count 264K   Patient does not require pre-op antibiotics for dental procedure.   Per office protocol, patient can hold Eliquis for 1 day prior to procedure given hx of multiple CVAs. Resume as soon as safely possible after.    **This guidance is not considered finalized until pre-operative APP has relayed final recommendations.**

## 2022-05-16 NOTE — Telephone Encounter (Signed)
Left message for pt's niece Ivin Booty to call back and confirm if the appt with Richardson Dopp, [facility] 05/17/22 @ 8:50 would be fine or if they need to change the appt. I left message when she calls back the operator can help with the in office appt if it needs to be changed.

## 2022-05-16 NOTE — Telephone Encounter (Signed)
Left message for the pt to call back to schedule an appt in the office for pre op clearance. Pt can see APP.

## 2022-05-16 NOTE — Progress Notes (Signed)
Carelink Summary Report / Loop Recorder 

## 2022-05-16 NOTE — Telephone Encounter (Signed)
   Pre-operative Risk Assessment    Patient Name: Ariel [name]  DOB: Aug 22, 1945 MRN: [id number]     Request for Surgical Clearance    Procedure:   REMOVAL OF TORI (BONY GROWTH IN PALATE)  Date of Surgery:  Clearance TBD                                 Surgeon:  DR. Veva Holes Surgeon's Group or Practice Name:  [facility]  Phone number:  [phone] Fax number:  [phone]   Type of Clearance Requested:   - Medical  - Pharmacy:  Hold Apixaban (Eliquis)     Type of Anesthesia:  Not Indicated; S/W THE DENTAL OFFICE AND THEY ARE GONG TO CONFIRM WITH DDS IF NEEDING LOCAL OR IF PT WILL BE SEDATED. OFFICE WILL CALL ME BACK    Additional requests/questions:    Jiles Prows   05/16/2022, 11:56 AM

## 2022-05-17 ENCOUNTER — Ambulatory Visit: Payer: Medicare HMO | Admitting: Physician Assistant

## 2022-05-17 DIAGNOSIS — Z01810 Encounter for preprocedural cardiovascular examination: Secondary | ICD-10-CM

## 2022-05-17 NOTE — Telephone Encounter (Signed)
Pt has scheduled appt for Christen Bame, NP 05/18/22. I will update all parties involved.

## 2022-05-17 NOTE — Telephone Encounter (Signed)
I left a detailed message for the pt's niece ok to leave a detailed message with appt time and date. I do so yesterday for the appt today with Richardson Dopp, PAC. I left on my vm yesterday as well to call back and confirm or change the appt if needed.   Trinidad Curet, PAC, RMA Rico Junker made me aware the pt did not make the appt today. I left message today for the pt's niece Ivin Booty to call back and reschedule an in office appt for the pt for pre op clearance.

## 2022-05-17 NOTE — Telephone Encounter (Signed)
I will forward notes to parties involved.

## 2022-05-17 NOTE — Telephone Encounter (Signed)
Niece rescheduled visit to 3/14.

## 2022-05-18 ENCOUNTER — Ambulatory Visit: Payer: Medicare HMO | Attending: Physician Assistant | Admitting: Nurse Practitioner

## 2022-05-18 ENCOUNTER — Encounter: Payer: Self-pay | Admitting: Nurse Practitioner

## 2022-05-18 VITALS — BP 114/64 | HR 56 | Ht 66.0 in | Wt 163.2 lb

## 2022-05-18 DIAGNOSIS — I63522 Cerebral infarction due to unspecified occlusion or stenosis of left anterior cerebral artery: Secondary | ICD-10-CM | POA: Diagnosis not present

## 2022-05-18 DIAGNOSIS — I480 Paroxysmal atrial fibrillation: Secondary | ICD-10-CM | POA: Diagnosis not present

## 2022-05-18 DIAGNOSIS — I639 Cerebral infarction, unspecified: Secondary | ICD-10-CM | POA: Diagnosis not present

## 2022-05-18 DIAGNOSIS — D6869 Other thrombophilia: Secondary | ICD-10-CM

## 2022-05-18 DIAGNOSIS — E785 Hyperlipidemia, unspecified: Secondary | ICD-10-CM

## 2022-05-18 DIAGNOSIS — Z01810 Encounter for preprocedural cardiovascular examination: Secondary | ICD-10-CM | POA: Diagnosis not present

## 2022-05-18 DIAGNOSIS — I10 Essential (primary) hypertension: Secondary | ICD-10-CM

## 2022-05-18 NOTE — Progress Notes (Signed)
Cardiology Office Note:    Date:  05/18/2022   ID:  Ariel [name], DOB 02-26-1946, MRN [id number]  PCP:  Willeen Niece, PA   [facility] Providers Cardiologist:  None     Referring MD: Willeen Niece, PA   Chief Complaint: Preoperative cardiac evaluation  History of Present Illness:    Ariel [name] is a very pleasant 77 y.o. female with a hx of CVA, HTN, former tobacco abuse, and type 2 diabetes.   Admission to hospital 08/27/2020 with worsening left-sided weakness.  Imaging showed multiple scattered small punctate infarcts bilaterally thought cardio embolic. Cardiac monitor showed no evidence of atrial fibrillation.  CT of the neck ruled out intracranial masses. Unfortunately she continued to have residual left-sided weakness with gait impairment and chronic swallowing difficulties.   Referred to cardiology and seen by Dr. Curt Bears on 11/23/20, had ILR placed. The device clinic received an alert for atrial fibrillation on 07/27/2021 which lasted 4.5 hours.  She was asymptomatic during that episode.  She has a CHA2DS2-VASc score of 5. No history of rheumatic fever or sleep apnea.  No evidence of intraatrial shunt on echo bubble study 08/2020.  Last cardiology clinic visit was with Adline Peals, PA on 08/11/21. She was advised to return in one month for further discussion of management of a fib.   She is here today for preoperative cardiac evaluation for removal of body growth in palate. She is accompanied by her niece with whom she lives. Reports she is feeling well, no specific cardiac concerns. Niece is attempting to call PCP office to clarify medications. Patient is exercising at the gym with a trainer 2 days per week. Reports she can ride the stationary bike for 5 minutes without stopping, can mop or vacuum a room, and can walk but does not walk very far. Performs her own self-care. Also does chair yoga and balance classes 2 days per week. She denies chest pain, shortness of breath,  lower extremity edema, fatigue, palpitations, melena, hematuria, hemoptysis, diaphoresis, weakness, presyncope, syncope, orthopnea, and PND.   Past Medical History:  Diagnosis Date   Hypertension    Stroke [facility])     Past Surgical History:  Procedure Laterality Date   TUBAL LIGATION     Performed at the age of 77 years old    Current Medications: Current Meds  Medication Sig   alendronate (FOSAMAX) 70 MG tablet Take 70 mg by mouth once a week.   amLODipine (NORVASC) 10 MG tablet Take 1 tablet by mouth at bedtime.   apixaban (ELIQUIS) 5 MG TABS tablet TAKE 1 TABLET TWICE DAILY. APPOINTMENT REQUIRED FOR REFILLS [phone]   atorvastatin (LIPITOR) 80 MG tablet Take 80 mg by mouth every evening.   buPROPion (WELLBUTRIN XL) 300 MG 24 hr tablet Take 300 mg by mouth daily.   busPIRone (BUSPAR) 5 MG tablet Take 5 mg by mouth 2 (two) times daily as needed.   carvedilol (COREG) 3.125 MG tablet Take 1 tablet (3.125 mg total) by mouth 2 (two) times daily.   cyanocobalamin (VITAMIN B12) 1000 MCG tablet Take 1,000 mcg by mouth daily.   ergocalciferol (VITAMIN D2) 1.25 MG (50000 UT) capsule 50,000 Units once a week.   famotidine (PEPCID) 20 MG tablet Take 20 mg by mouth daily.   fenofibrate micronized (LOFIBRA) 200 MG capsule Take 200 mg by mouth at bedtime.   fexofenadine (ALLEGRA) 180 MG tablet Take 180 mg by mouth daily as needed for allergies.   gabapentin (NEURONTIN) 100 MG capsule  Take 100 mg by mouth as needed.   hydrochlorothiazide (HYDRODIURIL) 12.5 MG tablet Take 12.5 mg by mouth daily.   pantoprazole (PROTONIX) 40 MG tablet TAKE 1 TABLET EVERY DAY   [DISCONTINUED] amlodipine-atorvastatin (CADUET) 10-10 MG tablet Take 1 tablet by mouth daily.   [DISCONTINUED] melatonin 5 MG TABS Take 5 mg by mouth at bedtime as needed.   [DISCONTINUED] ondansetron (ZOFRAN-ODT) 4 MG disintegrating tablet Take 4 mg by mouth every 8 (eight) hours as needed.   [DISCONTINUED] Phenylephrine-DM-GG-APAP  (DELSYM COUGH/COLD DAYTIME) 5-10-200-325 MG/10ML LIQD Take 10 mLs by mouth daily as needed (cough).   [DISCONTINUED] sertraline (ZOLOFT) 100 MG tablet Take 200 mg by mouth daily.     Allergies:   Lisinopril and Perflutren lipid microspheres   Social History   Socioeconomic History   Marital status: Widowed    Spouse name: Not on file   Number of children: Not on file   Years of education: Not on file   Highest education level: Not on file  Occupational History   Not on file  Tobacco Use   Smoking status: Never   Smokeless tobacco: Never  Vaping Use   Vaping Use: Never used  Substance and Sexual Activity   Alcohol use: Never   Drug use: Never   Sexual activity: Not on file  Other Topics Concern   Not on file  Social History Narrative   Not on file   Social Determinants of Health   Financial Resource Strain: Not on file  Food Insecurity: Not on file  Transportation Needs: Not on file  Physical Activity: Not on file  Stress: Not on file  Social Connections: Not on file     Family History: The patient's family history includes COPD in her mother; CVA in her maternal grandmother and paternal grandfather; Heart disease in her father and paternal grandmother.  ROS:   Please see the history of present illness.   All other systems reviewed and are negative.  Labs/Other Studies Reviewed:    The following studies were reviewed today:  Echo 01/19/21 1. Left ventricular ejection fraction, by estimation, is 60 to 65%. The  left ventricle has normal function. The left ventricle has no regional  wall motion abnormalities. There is mild left ventricular hypertrophy.  Left ventricular diastolic parameters  are consistent with Grade I diastolic dysfunction (impaired relaxation).   2. Right ventricular systolic function is normal. The right ventricular  size is normal. There is normal pulmonary artery systolic pressure. The  estimated right ventricular systolic pressure is 0000000  mmHg.   3. The mitral valve is normal in structure. Mild mitral valve  regurgitation. No evidence of mitral stenosis.   4. The aortic valve is tricuspid. Aortic valve regurgitation is not  visualized. No aortic stenosis is present.   5. The inferior vena cava is normal in size with greater than 50%  respiratory variability, suggesting right atrial pressure of 3 mmHg.   Cardiac Monitor 09/28/20 Sinus rhythm   Rates 50s to 110s    Average HR in 70s   Rare PVC Episode of syncope associated with SR 70s    Echo Bubble Study 08/27/20 1. Left ventricular ejection fraction, by estimation, is 60 to 65%. The  left ventricle has normal function. The left ventricle has no regional  wall motion abnormalities. There is mild left ventricular hypertrophy.  Left ventricular diastolic parameters  are consistent with Grade I diastolic dysfunction (impaired relaxation).   2. Right ventricular systolic function is normal. The right ventricular  size is normal. There is normal pulmonary artery systolic pressure.   3. The mitral valve is normal in structure. Mild mitral valve  regurgitation. No evidence of mitral stenosis.   4. The aortic valve is tricuspid. Aortic valve regurgitation is not  visualized. Mild aortic valve sclerosis is present, with no evidence of  aortic valve stenosis.   5. The inferior vena cava is normal in size with greater than 50%  respiratory variability, suggesting right atrial pressure of 3 mmHg.   6. Agitated saline contrast bubble study was negative, with no evidence  of any interatrial shunt.  Recent Labs: No results found for requested labs within last 365 days.  Recent Lipid Panel    Component Value Date/Time   CHOL 122 01/17/2021 1941   TRIG 85 01/17/2021 1941   HDL 48 01/17/2021 1941   CHOLHDL 2.5 01/17/2021 1941   VLDL 17 01/17/2021 1941   LDLCALC 57 01/17/2021 1941     Risk Assessment/Calculations:    CHA2DS2-VASc Score = 5  {This indicates a 7.2% annual risk  of stroke. The patient's score is based upon: CHF History: 0 HTN History: 1 Diabetes History: 0 Stroke History: 2 Vascular Disease History: 0 Age Score: 1 Gender Score: 1    Physical Exam:    VS:  BP 114/64   Pulse (!) 56   Ht '5\' 6"'$  (1.676 m)   Wt 163 lb 3.2 oz (74 kg)   LMP  (LMP Unknown)   SpO2 96%   BMI 26.34 kg/m     Wt Readings from Last 3 Encounters:  05/18/22 163 lb 3.2 oz (74 kg)  11/09/21 162 lb 6 oz (73.7 kg)  08/11/21 169 lb 6.4 oz (76.8 kg)     GEN:  Well nourished, well developed in no acute distress HEENT: Normal NECK: No JVD; No carotid bruits CARDIAC: RRR, no murmurs, rubs, gallops RESPIRATORY:  Clear to auscultation without rales, wheezing or rhonchi  ABDOMEN: Soft, non-tender, non-distended MUSCULOSKELETAL:  No edema; No deformity. 2+ pedal pulses, equal bilaterally SKIN: Warm and dry NEUROLOGIC:  Alert and oriented x 3 PSYCHIATRIC:  Normal affect   EKG:  EKG is ordered today.  The ekg ordered today demonstrates sinus bradycardia at 56 bpm, no ST abnormality  Diagnoses:    1. Preoperative cardiovascular examination   2. 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 Cryptogenic stroke (HCC)   4. Paroxysmal atrial fibrillation ([place])   5. Secondary hypercoagulable state ([place])   6. Benign essential HTN   7. Hyperlipidemia LDL goal <70    Assessment and Plan:     Preoperative cardiac evaluation: According to the Revised Cardiac Risk Index (RCRI), her Perioperative Risk of Major Cardiac Event is (%): 0.9. Her Functional Capacity in METs is: 4.4 according to the [place] Activity Status Index (DASI). The patient is doing well from a cardiac perspective. Therefore, based on ACC/AHA guidelines, the patient would be at acceptable risk for the planned procedure without further cardiovascular testing. Per office protocol, patient can hold Eliquis for 1 day prior to procedure given hx of multiple CVAs. Resume as soon  as safely possible after.    PAF on chronic anticoagulation: EKG today reveals maintaining sinus rhythm. She is asymptomatic. No bleeding concerns.  HR well-controlled.  Continue Eliquis at 5 mg twice daily which is appropriate dose.  Continue carvedilol.   History of stroke: Residual weakness for which she uses a cane.  Continue atorvastatin.  Management per neurology.  Hyperlipidemia LDL goal < 70: LDL 98 on 07/27/21. She is on high-dose statin. Emphasized importance of LDL < 70 with hx of stroke and diabetes. Management per PCP.   LINQ monitor:  Remote device check from 05/07/22 reviewed, normal device function, no arrhythmia. Management per EP.     Disposition: 1 year with Dr. Curt Bears  Medication Adjustments/Labs and Tests Ordered: Current medicines are reviewed at length with the patient today.  Concerns regarding medicines are outlined above.  Orders Placed This Encounter  Procedures   EKG 12-Lead   No orders of the defined types were placed in this encounter.   Patient Instructions  Medication Instructions:   Your physician recommends that you continue on your current medications as directed. Please refer to the Current Medication list given to you today.   *If you need a refill on your cardiac medications before your next appointment, please call your pharmacy*   Lab Work:  None ordered.  If you have labs (blood work) drawn today and your tests are completely normal, you will receive your results only by: [place] (if you have MyChart) OR A paper copy in the mail If you have any lab test that is abnormal or we need to change your treatment, we will call you to review the results.   Testing/Procedures:  None ordered.   Follow-Up: At [facility], you and your health needs are our priority.  As part of our continuing mission to provide you with exceptional heart care, we have created designated Provider Care Teams.  These Care Teams include your  primary Cardiologist (physician) and Advanced Practice Providers (APPs -  Physician Assistants and Nurse Practitioners) who all work together to provide you with the care you need, when you need it.  We recommend signing up for the patient portal called "MyChart".  Sign up information is provided on this After Visit Summary.  MyChart is used to connect with patients for Virtual Visits (Telemedicine).  Patients are able to view lab/test results, encounter notes, upcoming appointments, etc.  Non-urgent messages can be sent to your provider as well.   To learn more about what you can do with MyChart, go to [email or web address].    Your next appointment:   1 year(s)  Provider:   Allegra Lai, MD    Other Instructions  Your physician wants you to follow-up in: 1 year with Dr. Curt Bears.  You will receive a reminder letter in the mail two months in advance. If you don't receive a letter, please call our office to schedule the follow-up appointment.     Signed, Emmaline Life, NP  05/18/2022 1:22 PM    [place]

## 2022-05-18 NOTE — Patient Instructions (Signed)
Medication Instructions:   Your physician recommends that you continue on your current medications as directed. Please refer to the Current Medication list given to you today.   *If you need a refill on your cardiac medications before your next appointment, please call your pharmacy*   Lab Work:  None ordered  If you have labs (blood work) drawn today and your tests are completely normal, you will receive your results only by: MyChart Message (if you have MyChart) OR A paper copy in the mail If you have any lab test that is abnormal or we need to change your treatment, we will call you to review the results.   Testing/Procedures:  None ordered.   Follow-Up: At [facility], you and your health needs are our priority.  As part of our continuing mission to provide you with exceptional heart care, we have created designated Provider Care Teams.  These Care Teams include your primary Cardiologist (physician) and Advanced Practice Providers (APPs -  Physician Assistants and Nurse Practitioners) who all work together to provide you with the care you need, when you need it.  We recommend signing up for the patient portal called "MyChart".  Sign up information is provided on this After Visit Summary.  MyChart is used to connect with patients for Virtual Visits (Telemedicine).  Patients are able to view lab/test results, encounter notes, upcoming appointments, etc.  Non-urgent messages can be sent to your provider as well.   To learn more about what you can do with MyChart, go to [email or web address].    Your next appointment:   1 year(s)  Provider:   Will Camnitz, MD    Other Instructions  Your physician wants you to follow-up in: 1 year with Dr. Camnitz. You will receive a reminder letter in the mail two months in advance. If you don't receive a letter, please call our office to schedule the follow-up appointment.   

## 2022-06-12 ENCOUNTER — Ambulatory Visit (INDEPENDENT_AMBULATORY_CARE_PROVIDER_SITE_OTHER): Payer: Medicare HMO

## 2022-06-12 DIAGNOSIS — I639 Cerebral infarction, unspecified: Secondary | ICD-10-CM | POA: Diagnosis not present

## 2022-06-12 LAB — CUP PACEART REMOTE DEVICE CHECK
Date Time Interrogation Session: 20240405230933
Implantable Pulse Generator Implant Date: 20220920

## 2022-06-13 NOTE — Progress Notes (Signed)
Carelink Summary Report / Loop Recorder 

## 2022-06-29 ENCOUNTER — Other Ambulatory Visit: Payer: Self-pay | Admitting: Adult Health

## 2022-07-13 LAB — CUP PACEART REMOTE DEVICE CHECK
Date Time Interrogation Session: 20240508231121
Implantable Pulse Generator Implant Date: 20220920

## 2022-07-17 ENCOUNTER — Ambulatory Visit (INDEPENDENT_AMBULATORY_CARE_PROVIDER_SITE_OTHER): Payer: BC Managed Care – PPO

## 2022-07-17 DIAGNOSIS — I639 Cerebral infarction, unspecified: Secondary | ICD-10-CM | POA: Diagnosis not present

## 2022-07-17 NOTE — Progress Notes (Signed)
Carelink Summary Report / Loop Recorder 

## 2022-07-19 ENCOUNTER — Emergency Department (HOSPITAL_COMMUNITY): Payer: Medicare HMO

## 2022-07-19 ENCOUNTER — Inpatient Hospital Stay (HOSPITAL_COMMUNITY)
Admission: EM | Admit: 2022-07-19 | Discharge: 2022-07-29 | DRG: 194 | Disposition: A | Discharge status: Home / Self Care | Payer: Medicare HMO | Attending: Internal Medicine | Admitting: Internal Medicine

## 2022-07-19 ENCOUNTER — Encounter (HOSPITAL_COMMUNITY): Payer: Self-pay | Admitting: Pharmacy Technician

## 2022-07-19 ENCOUNTER — Other Ambulatory Visit: Payer: Self-pay

## 2022-07-19 DIAGNOSIS — N1832 Chronic kidney disease, stage 3b: Secondary | ICD-10-CM | POA: Diagnosis present

## 2022-07-19 DIAGNOSIS — N179 Acute kidney failure, unspecified: Secondary | ICD-10-CM | POA: Diagnosis not present

## 2022-07-19 DIAGNOSIS — Y92002 Bathroom of unspecified non-institutional (private) residence single-family (private) house as the place of occurrence of the external cause: Secondary | ICD-10-CM | POA: Diagnosis not present

## 2022-07-19 DIAGNOSIS — I69354 Hemiplegia and hemiparesis following cerebral infarction affecting left non-dominant side: Secondary | ICD-10-CM

## 2022-07-19 DIAGNOSIS — D649 Anemia, unspecified: Secondary | ICD-10-CM | POA: Diagnosis not present

## 2022-07-19 DIAGNOSIS — E611 Iron deficiency: Secondary | ICD-10-CM | POA: Diagnosis not present

## 2022-07-19 DIAGNOSIS — D509 Iron deficiency anemia, unspecified: Secondary | ICD-10-CM | POA: Diagnosis present

## 2022-07-19 DIAGNOSIS — F32A Depression, unspecified: Secondary | ICD-10-CM | POA: Diagnosis present

## 2022-07-19 DIAGNOSIS — K5520 Angiodysplasia of colon without hemorrhage: Secondary | ICD-10-CM | POA: Diagnosis not present

## 2022-07-19 DIAGNOSIS — J189 Pneumonia, unspecified organism: Secondary | ICD-10-CM | POA: Diagnosis present

## 2022-07-19 DIAGNOSIS — R053 Chronic cough: Secondary | ICD-10-CM | POA: Diagnosis present

## 2022-07-19 DIAGNOSIS — D62 Acute posthemorrhagic anemia: Secondary | ICD-10-CM | POA: Diagnosis present

## 2022-07-19 DIAGNOSIS — D126 Benign neoplasm of colon, unspecified: Secondary | ICD-10-CM | POA: Diagnosis not present

## 2022-07-19 DIAGNOSIS — F419 Anxiety disorder, unspecified: Secondary | ICD-10-CM | POA: Diagnosis not present

## 2022-07-19 DIAGNOSIS — I639 Cerebral infarction, unspecified: Secondary | ICD-10-CM | POA: Diagnosis present

## 2022-07-19 DIAGNOSIS — I10 Essential (primary) hypertension: Secondary | ICD-10-CM | POA: Diagnosis not present

## 2022-07-19 DIAGNOSIS — K922 Gastrointestinal hemorrhage, unspecified: Secondary | ICD-10-CM | POA: Diagnosis not present

## 2022-07-19 DIAGNOSIS — D72819 Decreased white blood cell count, unspecified: Secondary | ICD-10-CM | POA: Diagnosis not present

## 2022-07-19 DIAGNOSIS — N189 Chronic kidney disease, unspecified: Secondary | ICD-10-CM | POA: Diagnosis not present

## 2022-07-19 DIAGNOSIS — I480 Paroxysmal atrial fibrillation: Secondary | ICD-10-CM | POA: Diagnosis present

## 2022-07-19 DIAGNOSIS — E860 Dehydration: Secondary | ICD-10-CM | POA: Diagnosis present

## 2022-07-19 DIAGNOSIS — Z825 Family history of asthma and other chronic lower respiratory diseases: Secondary | ICD-10-CM

## 2022-07-19 DIAGNOSIS — K449 Diaphragmatic hernia without obstruction or gangrene: Secondary | ICD-10-CM | POA: Diagnosis present

## 2022-07-19 DIAGNOSIS — Z888 Allergy status to other drugs, medicaments and biological substances status: Secondary | ICD-10-CM

## 2022-07-19 DIAGNOSIS — E785 Hyperlipidemia, unspecified: Secondary | ICD-10-CM | POA: Diagnosis present

## 2022-07-19 DIAGNOSIS — Z1152 Encounter for screening for COVID-19: Secondary | ICD-10-CM | POA: Diagnosis not present

## 2022-07-19 DIAGNOSIS — I129 Hypertensive chronic kidney disease with stage 1 through stage 4 chronic kidney disease, or unspecified chronic kidney disease: Secondary | ICD-10-CM | POA: Diagnosis present

## 2022-07-19 DIAGNOSIS — E538 Deficiency of other specified B group vitamins: Secondary | ICD-10-CM | POA: Diagnosis present

## 2022-07-19 DIAGNOSIS — D123 Benign neoplasm of transverse colon: Secondary | ICD-10-CM | POA: Diagnosis present

## 2022-07-19 DIAGNOSIS — K648 Other hemorrhoids: Secondary | ICD-10-CM | POA: Diagnosis present

## 2022-07-19 DIAGNOSIS — K921 Melena: Secondary | ICD-10-CM | POA: Diagnosis present

## 2022-07-19 DIAGNOSIS — R197 Diarrhea, unspecified: Secondary | ICD-10-CM | POA: Diagnosis present

## 2022-07-19 DIAGNOSIS — I63522 Cerebral infarction due to unspecified occlusion or stenosis of left anterior cerebral artery: Secondary | ICD-10-CM | POA: Diagnosis not present

## 2022-07-19 DIAGNOSIS — Z7983 Long term (current) use of bisphosphonates: Secondary | ICD-10-CM

## 2022-07-19 DIAGNOSIS — Z79899 Other long term (current) drug therapy: Secondary | ICD-10-CM

## 2022-07-19 DIAGNOSIS — W182XXA Fall in (into) shower or empty bathtub, initial encounter: Secondary | ICD-10-CM | POA: Diagnosis present

## 2022-07-19 DIAGNOSIS — D631 Anemia in chronic kidney disease: Secondary | ICD-10-CM | POA: Diagnosis not present

## 2022-07-19 DIAGNOSIS — Z7901 Long term (current) use of anticoagulants: Secondary | ICD-10-CM | POA: Diagnosis not present

## 2022-07-19 DIAGNOSIS — Z8249 Family history of ischemic heart disease and other diseases of the circulatory system: Secondary | ICD-10-CM

## 2022-07-19 DIAGNOSIS — E869 Volume depletion, unspecified: Secondary | ICD-10-CM | POA: Diagnosis present

## 2022-07-19 DIAGNOSIS — E871 Hypo-osmolality and hyponatremia: Secondary | ICD-10-CM | POA: Diagnosis present

## 2022-07-19 DIAGNOSIS — J123 Human metapneumovirus pneumonia: Secondary | ICD-10-CM | POA: Diagnosis present

## 2022-07-19 DIAGNOSIS — R195 Other fecal abnormalities: Secondary | ICD-10-CM

## 2022-07-19 DIAGNOSIS — J180 Bronchopneumonia, unspecified organism: Secondary | ICD-10-CM | POA: Diagnosis not present

## 2022-07-19 DIAGNOSIS — K317 Polyp of stomach and duodenum: Secondary | ICD-10-CM | POA: Diagnosis present

## 2022-07-19 DIAGNOSIS — I4891 Unspecified atrial fibrillation: Secondary | ICD-10-CM | POA: Diagnosis not present

## 2022-07-19 DIAGNOSIS — F418 Other specified anxiety disorders: Secondary | ICD-10-CM | POA: Diagnosis not present

## 2022-07-19 DIAGNOSIS — Z823 Family history of stroke: Secondary | ICD-10-CM

## 2022-07-19 DIAGNOSIS — K219 Gastro-esophageal reflux disease without esophagitis: Secondary | ICD-10-CM | POA: Diagnosis present

## 2022-07-19 LAB — CBC WITH DIFFERENTIAL/PLATELET
Abs Immature Granulocytes: 0.03 10*3/uL (ref 0.00–0.07)
Basophils Absolute: 0 10*3/uL (ref 0.0–0.1)
Basophils Relative: 0 %
Eosinophils Absolute: 0 10*3/uL (ref 0.0–0.5)
Eosinophils Relative: 0 %
HCT: 26.6 % — ABNORMAL LOW (ref 36.0–46.0)
Hemoglobin: 7.9 g/dL — ABNORMAL LOW (ref 12.0–15.0)
Immature Granulocytes: 1 %
Lymphocytes Relative: 19 %
Lymphs Abs: 1.3 10*3/uL (ref 0.7–4.0)
MCH: 23.3 pg — ABNORMAL LOW (ref 26.0–34.0)
MCHC: 29.7 g/dL — ABNORMAL LOW (ref 30.0–36.0)
MCV: 78.5 fL — ABNORMAL LOW (ref 80.0–100.0)
Monocytes Absolute: 0.3 10*3/uL (ref 0.1–1.0)
Monocytes Relative: 4 %
Neutro Abs: 4.9 10*3/uL (ref 1.7–7.7)
Neutrophils Relative %: 76 %
Platelets: 132 10*3/uL — ABNORMAL LOW (ref 150–400)
RBC: 3.39 MIL/uL — ABNORMAL LOW (ref 3.87–5.11)
RDW: 17.2 % — ABNORMAL HIGH (ref 11.5–15.5)
WBC: 6.5 10*3/uL (ref 4.0–10.5)
nRBC: 0 % (ref 0.0–0.2)

## 2022-07-19 LAB — RETICULOCYTES
Immature Retic Fract: 20.3 % — ABNORMAL HIGH (ref 2.3–15.9)
RBC.: 3.21 MIL/uL — ABNORMAL LOW (ref 3.87–5.11)
Retic Count, Absolute: 48.5 10*3/uL (ref 19.0–186.0)
Retic Ct Pct: 1.5 % (ref 0.4–3.1)

## 2022-07-19 LAB — RESPIRATORY PANEL BY PCR

## 2022-07-19 LAB — CBC
HCT: 24.2 % — ABNORMAL LOW (ref 36.0–46.0)
Hemoglobin: 7.4 g/dL — ABNORMAL LOW (ref 12.0–15.0)
MCH: 23.6 pg — ABNORMAL LOW (ref 26.0–34.0)
MCHC: 30.6 g/dL (ref 30.0–36.0)
MCV: 77.3 fL — ABNORMAL LOW (ref 80.0–100.0)
Platelets: 142 10*3/uL — ABNORMAL LOW (ref 150–400)
RBC: 3.13 MIL/uL — ABNORMAL LOW (ref 3.87–5.11)
RDW: 17.1 % — ABNORMAL HIGH (ref 11.5–15.5)
WBC: 5.1 10*3/uL (ref 4.0–10.5)
nRBC: 0 % (ref 0.0–0.2)

## 2022-07-19 LAB — LIPASE, BLOOD: Lipase: 26 U/L (ref 11–51)

## 2022-07-19 LAB — COMPREHENSIVE METABOLIC PANEL
ALT: 25 U/L (ref 0–44)
AST: 65 U/L — ABNORMAL HIGH (ref 15–41)
Albumin: 3.2 g/dL — ABNORMAL LOW (ref 3.5–5.0)
Alkaline Phosphatase: 33 U/L — ABNORMAL LOW (ref 38–126)
Anion gap: 11 (ref 5–15)
BUN: 37 mg/dL — ABNORMAL HIGH (ref 8–23)
CO2: 18 mmol/L — ABNORMAL LOW (ref 22–32)
Calcium: 8.2 mg/dL — ABNORMAL LOW (ref 8.9–10.3)
Chloride: 101 mmol/L (ref 98–111)
Creatinine, Ser: 1.69 mg/dL — ABNORMAL HIGH (ref 0.44–1.00)
GFR, Estimated: 31 mL/min — ABNORMAL LOW (ref >60)
Glucose, Bld: 124 mg/dL — ABNORMAL HIGH (ref 70–99)
Potassium: 4.4 mmol/L (ref 3.5–5.1)
Sodium: 130 mmol/L — ABNORMAL LOW (ref 135–145)
Total Bilirubin: 0.6 mg/dL (ref 0.3–1.2)
Total Protein: 6.3 g/dL — ABNORMAL LOW (ref 6.5–8.1)

## 2022-07-19 LAB — TYPE AND SCREEN
ABO/RH(D): A POS
Antibody Screen: NEGATIVE

## 2022-07-19 LAB — LACTIC ACID, PLASMA
Lactic Acid, Venous: 1.9 mmol/L (ref 0.5–1.9)
Lactic Acid, Venous: 1.5 mmol/L (ref 0.5–1.9)

## 2022-07-19 LAB — PROTIME-INR
INR: 1.5 — ABNORMAL HIGH (ref 0.8–1.2)
Prothrombin Time: 17.9 seconds — ABNORMAL HIGH (ref 11.4–15.2)

## 2022-07-19 LAB — IRON AND TIBC
Iron: 13 ug/dL — ABNORMAL LOW (ref 28–170)
Saturation Ratios: 4 % — ABNORMAL LOW (ref 10.4–31.8)
TIBC: 336 ug/dL (ref 250–450)
UIBC: 323 ug/dL

## 2022-07-19 LAB — ABO/RH: ABO/RH(D): A POS

## 2022-07-19 LAB — SARS CORONAVIRUS 2 BY RT PCR: SARS Coronavirus 2 by RT PCR: NEGATIVE

## 2022-07-19 LAB — FOLATE: Folate: 20.4 ng/mL (ref >5.9)

## 2022-07-19 LAB — VITAMIN B12: Vitamin B-12: 1167 pg/mL — ABNORMAL HIGH (ref 180–914)

## 2022-07-19 LAB — FERRITIN: Ferritin: 64 ng/mL (ref 11–307)

## 2022-07-19 LAB — TROPONIN I (HIGH SENSITIVITY)
Troponin I (High Sensitivity): 25 ng/L — ABNORMAL HIGH (ref <18)
Troponin I (High Sensitivity): 20 ng/L — ABNORMAL HIGH (ref <18)

## 2022-07-19 LAB — POC OCCULT BLOOD, ED: Fecal Occult Bld: POSITIVE — AB

## 2022-07-19 LAB — BRAIN NATRIURETIC PEPTIDE: B Natriuretic Peptide: 231.2 pg/mL — ABNORMAL HIGH (ref 0.0–100.0)

## 2022-07-19 MED ORDER — ATORVASTATIN CALCIUM 80 MG PO TABS
80.0000 mg | ORAL_TABLET | Freq: Every evening | ORAL | Status: DC
  Administered 2022-07-19 – 2022-07-28 (×10): 80 mg via ORAL
  Filled 2022-07-19: qty 1
  Filled 2022-07-19: qty 2
  Filled 2022-07-19: qty 1
  Filled 2022-07-19: qty 2
  Filled 2022-07-19: qty 1
  Filled 2022-07-19 (×2): qty 2
  Filled 2022-07-19: qty 1
  Filled 2022-07-19: qty 2
  Filled 2022-07-19: qty 1

## 2022-07-19 MED ORDER — BUPROPION HCL ER (XL) 150 MG PO TB24
300.0000 mg | ORAL_TABLET | Freq: Every day | ORAL | Status: DC
  Administered 2022-07-20 – 2022-07-29 (×9): 300 mg via ORAL
  Filled 2022-07-19: qty 1
  Filled 2022-07-19: qty 2
  Filled 2022-07-19: qty 1
  Filled 2022-07-19: qty 2
  Filled 2022-07-19: qty 1
  Filled 2022-07-19 (×2): qty 2
  Filled 2022-07-19: qty 1
  Filled 2022-07-19: qty 2
  Filled 2022-07-19: qty 1

## 2022-07-19 MED ORDER — GABAPENTIN 100 MG PO CAPS
100.0000 mg | ORAL_CAPSULE | Freq: Two times a day (BID) | ORAL | Status: DC
  Administered 2022-07-19 – 2022-07-29 (×19): 100 mg via ORAL
  Filled 2022-07-19 (×19): qty 1

## 2022-07-19 MED ORDER — PANTOPRAZOLE 80MG IVPB - SIMPLE MED
80.0000 mg | Freq: Once | INTRAVENOUS | Status: AC
  Administered 2022-07-19: 80 mg via INTRAVENOUS
  Filled 2022-07-19: qty 100

## 2022-07-19 MED ORDER — ALBUTEROL SULFATE (2.5 MG/3ML) 0.083% IN NEBU: 2.5000 mg | INHALATION_SOLUTION | RESPIRATORY_TRACT | Status: DC | PRN

## 2022-07-19 MED ORDER — CARVEDILOL 3.125 MG PO TABS
3.1250 mg | ORAL_TABLET | Freq: Two times a day (BID) | ORAL | Status: DC
  Administered 2022-07-19 – 2022-07-29 (×19): 3.125 mg via ORAL
  Filled 2022-07-19 (×19): qty 1

## 2022-07-19 MED ORDER — SODIUM CHLORIDE 0.9 % IV SOLN
1.0000 g | Freq: Once | INTRAVENOUS | Status: AC
  Administered 2022-07-19: 1 g via INTRAVENOUS
  Filled 2022-07-19: qty 10

## 2022-07-19 MED ORDER — GABAPENTIN 100 MG PO CAPS: 100.0000 mg | ORAL_CAPSULE | ORAL | Status: DC | PRN

## 2022-07-19 MED ORDER — SODIUM CHLORIDE 0.9 % IV SOLN: INTRAVENOUS | Status: DC

## 2022-07-19 MED ORDER — ACETAMINOPHEN 325 MG PO TABS
650.0000 mg | ORAL_TABLET | Freq: Four times a day (QID) | ORAL | Status: DC | PRN
  Administered 2022-07-22 – 2022-07-28 (×3): 650 mg via ORAL
  Filled 2022-07-19 (×4): qty 2

## 2022-07-19 MED ORDER — PANTOPRAZOLE SODIUM 40 MG IV SOLR
40.0000 mg | Freq: Two times a day (BID) | INTRAVENOUS | Status: DC
  Administered 2022-07-19 – 2022-07-21 (×4): 40 mg via INTRAVENOUS
  Filled 2022-07-19 (×4): qty 10

## 2022-07-19 MED ORDER — HYDRALAZINE HCL 20 MG/ML IJ SOLN
5.0000 mg | INTRAMUSCULAR | Status: DC | PRN
  Administered 2022-07-23 – 2022-07-26 (×2): 5 mg via INTRAVENOUS
  Filled 2022-07-19 (×2): qty 1

## 2022-07-19 MED ORDER — AZITHROMYCIN 500 MG PO TABS
500.0000 mg | ORAL_TABLET | Freq: Every day | ORAL | Status: DC
  Administered 2022-07-19 – 2022-07-25 (×7): 500 mg via ORAL
  Filled 2022-07-19 (×7): qty 1

## 2022-07-19 MED ORDER — SODIUM CHLORIDE 0.9 % IV SOLN
2.0000 g | INTRAVENOUS | Status: AC
  Administered 2022-07-20 – 2022-07-24 (×5): 2 g via INTRAVENOUS
  Filled 2022-07-19 (×5): qty 20

## 2022-07-19 MED ORDER — HYDROCODONE-ACETAMINOPHEN 5-325 MG PO TABS: 1.0000 | ORAL_TABLET | Freq: Four times a day (QID) | ORAL | Status: DC | PRN

## 2022-07-19 MED ORDER — ACETAMINOPHEN 650 MG RE SUPP: 650.0000 mg | Freq: Four times a day (QID) | RECTAL | Status: DC | PRN

## 2022-07-19 MED ORDER — BUSPIRONE HCL 5 MG PO TABS: 5.0000 mg | ORAL_TABLET | Freq: Two times a day (BID) | ORAL | Status: DC | PRN

## 2022-07-19 MED ORDER — VITAMIN B-12 1000 MCG PO TABS
1000.0000 ug | ORAL_TABLET | Freq: Every day | ORAL | Status: DC
  Administered 2022-07-19: 1000 ug via ORAL
  Filled 2022-07-19 (×2): qty 1

## 2022-07-19 NOTE — H&P (Signed)
TRH H&P   Patient Demographics:    Ariel [name], is a 77 y.o. female  MRN: [id number]   DOB - 05/21/1945  Admit Date - 07/19/2022  Outpatient Primary MD for the patient is Briscoe, [name], MD  Referring MD/NP/PA: Dr Jacqulyn Bath  Outpatient Specialists: [facility]    Patient coming from: home  No chief complaint on file.     HPI:    Ariel [name]  is a 77 y.o. female, of HTN and hx of stroke with residual left sided weakness, HLD, GERD, CKD stage 3b, depression, atrial fibrillation on Eliquis who presented to Ed with cough, shortness of breath, and dark stools.   -Patient reports feeling sick for last 4 to 5 days, congestion, cough and shortness of breath, as well she reports diarrhea over last 24 hours, melena light, dark in color, she had dental procedure 10 days ago for which she held Eliquis for 2 days, took amoxicillin x 3 doses, reports over the weekend she started to feel sick, she saw her PCP yesterday who started her on doxycycline which she has not started to take any, reported at home today where she lives with her niece, she had a fall in the bathtub, she denies any head trauma, loss of consciousness, but report generalized weakness and fatigue, she fell at her buttocks, for which they had to call EMS -In ED her workup significant for dehydration, sodium of 130, chest x-ray significant for left lower lobe opacity, hemoglobin low at 7.9, down from 10.1 this March, colonoscopy last October at [facility] significant for polyps, patient reports she never had endoscopy before, she denies any NSAIDs use, she was Hemoccult positive, [facility] hospitalist consulted to admit.   Review of systems:      A full 10 point Review of Systems was done, except as stated above, all other Review of Systems were negative.   With Past History of the following :    Past Medical History:  Diagnosis Date    Hypertension    Stroke [facility])       Past Surgical History:  Procedure Laterality Date   TUBAL LIGATION     Performed at the age of 77 years old      Social History:     Social History   Tobacco Use   Smoking status: Never   Smokeless tobacco: Never  Substance Use Topics   Alcohol use: Never       Family History :     Family History  Problem Relation Age of Onset   COPD Mother    Heart disease Father    CVA Maternal Grandmother    Heart disease Paternal Grandmother    CVA Paternal Grandfather       Home Medications:   Prior to Admission medications   Medication Sig Start Date End Date Taking? Authorizing Provider  alendronate (FOSAMAX) 70 MG tablet Take 70  mg by mouth once a week.    [provider]  amLODipine (NORVASC) 10 MG tablet Take 1 tablet by mouth at bedtime. 04/20/22   [provider]  apixaban (ELIQUIS) 5 MG TABS tablet TAKE 1 TABLET TWICE DAILY. APPOINTMENT REQUIRED FOR REFILLS [id number] 02/13/22   Fenton, Clint R, PA  atorvastatin (LIPITOR) 80 MG tablet Take 80 mg by mouth every evening. 12/16/20   [provider]  buPROPion (WELLBUTRIN XL) 300 MG 24 hr tablet Take 300 mg by mouth daily. 06/14/21   [provider]  busPIRone (BUSPAR) 5 MG tablet Take 5 mg by mouth 2 (two) times daily as needed.    [provider]  carvedilol (COREG) 3.125 MG tablet Take 1 tablet (3.125 mg total) by mouth 2 (two) times daily. 01/20/21   Leroy Sea, MD  cyanocobalamin (VITAMIN B12) 1000 MCG tablet Take 1,000 mcg by mouth daily.    [provider]  ergocalciferol (VITAMIN D2) 1.25 MG (50000 UT) capsule 50,000 Units once a week. 05/09/22 05/09/23  [provider]  famotidine (PEPCID) 20 MG tablet Take 20 mg by mouth daily.    [provider]  fenofibrate micronized (LOFIBRA) 200 MG capsule Take 200 mg by mouth at bedtime.    [provider]  fexofenadine (ALLEGRA) 180 MG tablet Take 180 mg  by mouth daily as needed for allergies.    [provider]  fluticasone (FLONASE) 50 MCG/ACT nasal spray Place 1 spray into both nostrils as needed. 11/03/13 10/03/21  [provider]  gabapentin (NEURONTIN) 100 MG capsule Take 100 mg by mouth as needed.    [provider]  hydrochlorothiazide (HYDRODIURIL) 12.5 MG tablet Take 12.5 mg by mouth daily. 08/03/21   [provider]  losartan (COZAAR) 50 MG tablet TAKE 1 TABLET(50 MG) BY MOUTH DAILY 06/20/21   [provider]  pantoprazole (PROTONIX) 40 MG tablet TAKE 1 TABLET EVERY DAY 06/29/22   Ihor Austin, NP     Allergies:     Allergies  Allergen Reactions   Lisinopril     Other reaction(s): Cough   Perflutren Lipid Microspheres     Other reaction(s): Abdominal Pain, Muscle Pain     Physical Exam:   Vitals  Blood pressure (!) 133/53, pulse 77, temperature 97.8 F (36.6 C), resp. rate (!) 25, SpO2 99 %.   1. General *female, laying in bed, in mild discomfort due to acute illness and cough  2. Normal affect and insight, Not Suicidal or Homicidal, Awake Alert, Oriented X 3.  3. No F.N deficits, ALL C.Nerves Intact, Strength 5/5 all 4 extremities, Sensation intact all 4 extremities, Plantars down going.  4. Ears and Eyes appear Normal, Conjunctivae clear, PERRLA. Moist Oral Mucosa.  5. Supple Neck, No JVD, No cervical lymphadenopathy appriciated, No Carotid Bruits.  6. Symmetrical Chest wall movement, Good air movement bilaterally, rales at the bases.  7. RRR, No Gallops, Rubs or Murmurs, No Parasternal Heave.  8. Positive Bowel Sounds, Abdomen Soft, No tenderness, No organomegaly appriciated,No rebound -guarding or rigidity.  9.  No Cyanosis, Normal Skin Turgor, No Skin Rash or Bruise.  10. Good muscle tone,  joints appear normal , no effusions, Normal ROM.     Data Review:    CBC Recent Labs  Lab 07/19/22 1410  WBC 6.5  HGB 7.9*  HCT 26.6*  PLT 132*  MCV 78.5*  MCH  23.3*  MCHC 29.7*  RDW 17.2*  LYMPHSABS 1.3  MONOABS 0.3  EOSABS 0.0  BASOSABS 0.0   ------------------------------------------------------------------------------------------------------------------  Chemistries  Recent Labs  Lab 07/19/22 1410  NA 130*  K 4.4  CL 101  CO2 18*  GLUCOSE 124*  BUN 37*  CREATININE 1.69*  CALCIUM 8.2*  AST 65*  ALT 25  ALKPHOS 33*  BILITOT 0.6   ------------------------------------------------------------------------------------------------------------------ CrCl cannot be calculated (Unknown ideal weight.). ------------------------------------------------------------------------------------------------------------------ No results for input(s): "TSH", "T4TOTAL", "T3FREE", "THYROIDAB" in the last 72 hours.  Invalid input(s): "FREET3"  Coagulation profile Recent Labs  Lab 07/19/22 1410  INR 1.5*   ------------------------------------------------------------------------------------------------------------------- No results for input(s): "DDIMER" in the last 72 hours. -------------------------------------------------------------------------------------------------------------------  Cardiac Enzymes No results for input(s): "CKMB", "TROPONINI", "MYOGLOBIN" in the last 168 hours.  Invalid input(s): "CK" ------------------------------------------------------------------------------------------------------------------    Component Value Date/Time   BNP 231.2 (H) 07/19/2022 1410     ---------------------------------------------------------------------------------------------------------------  Urinalysis    Component Value Date/Time   COLORURINE YELLOW 01/17/2021 1613   APPEARANCEUR CLEAR 01/17/2021 1613   LABSPEC 1.012 01/17/2021 1613   PHURINE 6.0 01/17/2021 1613   GLUCOSEU NEGATIVE 01/17/2021 1613   HGBUR NEGATIVE 01/17/2021 1613   BILIRUBINUR NEGATIVE 01/17/2021 1613   KETONESUR NEGATIVE 01/17/2021 1613   PROTEINUR NEGATIVE  01/17/2021 1613   NITRITE NEGATIVE 01/17/2021 1613   LEUKOCYTESUR NEGATIVE 01/17/2021 1613    ----------------------------------------------------------------------------------------------------------------   Imaging Results:    DG Chest Portable 1 View  Result Date: 07/19/2022 CLINICAL DATA:  Shortness of breath, cough EXAM: PORTABLE CHEST 1 VIEW COMPARISON:  03/31/2018 FINDINGS: Transverse diameter of heart is slightly increased. There are no signs of pulmonary edema. Patchy infiltrates are seen in the left lower lung field. There is no significant pleural effusion or pneumothorax. There is implantable cardiac monitoring device in left chest wall. IMPRESSION: Patchy infiltrate in left lower lung field suggesting atelectasis/pneumonia. Electronically Signed   By: Ernie Avena M.D.   On: 07/19/2022 15:24    EKG:  Vent. rate 79 BPM PR interval 161 ms QRS duration 99 ms QT/QTcB 397/456 ms P-R-T axes 77 59 31 Sinus rhythm Borderline T abnormalities, anterior leads  Assessment & Plan:    Principal Problem:   Pneumonia Active Problems:   CVA (cerebral vascular accident) (HCC)   Benign essential HTN   Anxiety and depression   GERD (gastroesophageal reflux disease)   Stage 3b chronic kidney disease (HCC)   Paroxysmal atrial fibrillation (HCC)   Normocytic anemia   Left lower lobe pneumonia -patient presents with cough, congestion, x-ray significant for left lower lobe pneumonia-IV Rocephin and azithromycin. -Follow on blood cultures. -Check Legionella antigen, strep pneumonia antigen, and sputum cultures -Will start on azithromycin and Rocephin -She was encouraged to use incentive spirometry and flutter valve  Microcytic  anemia, GI blood loss anemia -Baseline hemoglobin 10-11, down to 7.9, presents with melena -Continue to hold Eliquis - Colonoscopy 10/24/2021 significant for 3 subcentimeter polyp, and 110 mm polyp removed by cold snare's with 1 clip placed  successfully. -Gust with GI, clear liquid diet, n.p.o. after midnight, and possible EGD tomorrow -continue with Protonix 40 mg IV twice daily -Monitor CBC closely and transfuse as needed, patient likely able to transfusion if indicated  B12 deficiency -Check B12 level, continue with supplements  Falls/deconditioning -The setting of acute illness, will consult PT/OT  Hyperlipedemia - Continue with home dose statin   Paroxysmal A-fib -Liquids on hold due to above, -Resume Coreg for heart rate control  Hyponatremia - Due to volume depletion, will give gentle hydration  Hypertension  -Blood pressure is soft, will keep on as needed  hydralazine and hold home medications, including losartan and amlodipine, will keep only on Coreg due to A-fib  GERD  -She will be on IV PPI   Anxiety/depression  -Continue with home medications   Recurrent CVAs -Continue with Eliquis once cleared by GI  CKD stage IIIb  -Renal function and at baseline, continue to monitor closely, avoid nephrotoxic medications     DVT Prophylaxis will hold Eliquis , SCDs  AM Labs Ordered, also please review Full Orders  Family Communication: Admission, patients condition and plan of care including tests being ordered have been discussed with the patient and niece* who indicate understanding and agree with the plan and Code Status.  Code Status full  Likely DC to  home  Condition GUARDED    Consults called: [facility]  , to see in am  Admission status: inpatient    Time spent in minutes : 70 minutes   Huey Bienenstock M.D on 07/19/2022 at 4:37 PM   [facility] - Office  [phone]

## 2022-07-19 NOTE — ED Provider Notes (Signed)
Emergency Department Provider Note   I have reviewed the triage vital signs and the nursing notes.   HISTORY  Chief Complaint No chief complaint on file.   HPI Ariel [name] is a 77 y.o. female with PMH of HTN, prior CVA, and PAF on ELiquis emergency department for evaluation of persistent cough over the past week with new onset diarrhea.  Patient states a week ago she developed a cough which is gradually worsened.  She is felt some fatigue.  Unsure of fever.  No chest pain.  Does note some shortness of breath, mainly with exertion.  2 days ago she began having dark-colored diarrhea.  She is having approximately 2 diarrhea episodes per day.  No bright red blood.  No abdominal pain.  He lives at home with her niece and fell in the bathtub today, landing on her buttocks, which ultimately prompted her to call EMS.  Prior to arrival, she was given 600 mL of LR and placed on nasal cannula oxygen for increased work of breathing.    Past Medical History:  Diagnosis Date   Hypertension    Stroke [facility])     Review of Systems  Constitutional: No fever/chills. Positive generalized weakness.  Cardiovascular: Denies chest pain. Respiratory: Positive shortness of breath. Gastrointestinal: No abdominal pain.  No nausea, no vomiting. Positive diarrhea.  No constipation. Musculoskeletal: Negative for back pain. Skin: Negative for rash. Neurological: Negative for headaches, focal weakness or numbness.   ____________________________________________   PHYSICAL EXAM:  VITAL SIGNS: ED Triage Vitals  Enc Vitals Group     BP 07/19/22 1403 130/84     Pulse Rate 07/19/22 1403 80     Resp 07/19/22 1403 (!) 26     Temp 07/19/22 1403 97.8 F (36.6 C)     Temp src --      SpO2 07/19/22 1403 100 %   Constitutional: Alert and oriented. Well appearing and in no acute distress. Eyes: Conjunctivae are normal.  Head: Atraumatic. Nose: No congestion/rhinnorhea. Mouth/Throat: Mucous membranes are  dry.  Neck: No stridor.   Cardiovascular: Normal rate, regular rhythm. Good peripheral circulation. Grossly normal heart sounds.   Respiratory: Increased respiratory effort with movement.  No retractions. Lungs CTAB. Gastrointestinal: Soft and nontender. No distention.  Rectal exam performed after obtaining patient's verbal consent and with nurse chaperone at bedside.  Patient with black stool on exam. No BRBPR.  Musculoskeletal: No lower extremity tenderness nor edema. No gross deformities of extremities. Neurologic:  Normal speech and language. No gross focal neurologic deficits are appreciated.  Skin:  Skin is warm, dry and intact. No rash noted.  ____________________________________________   LABS (all labs ordered are listed, but only abnormal results are displayed)  Labs Reviewed  RESPIRATORY PANEL BY PCR - Abnormal; Notable for the following components:      Result Value   Metapneumovirus DETECTED (*)    All other components within normal limits  COMPREHENSIVE METABOLIC PANEL - Abnormal; Notable for the following components:   Sodium 130 (*)    CO2 18 (*)    Glucose, Bld 124 (*)    BUN 37 (*)    Creatinine, Ser 1.69 (*)    Calcium 8.2 (*)    Total Protein 6.3 (*)    Albumin 3.2 (*)    AST 65 (*)    Alkaline Phosphatase 33 (*)    GFR, Estimated 31 (*)    All other components within normal limits  BRAIN NATRIURETIC PEPTIDE - Abnormal; Notable for the  following components:   B Natriuretic Peptide 231.2 (*)    All other components within normal limits  CBC WITH DIFFERENTIAL/PLATELET - Abnormal; Notable for the following components:   RBC 3.39 (*)    Hemoglobin 7.9 (*)    HCT 26.6 (*)    MCV 78.5 (*)    MCH 23.3 (*)    MCHC 29.7 (*)    RDW 17.2 (*)    Platelets 132 (*)    All other components within normal limits  PROTIME-INR - Abnormal; Notable for the following components:   Prothrombin Time 17.9 (*)    INR 1.5 (*)    All other components within normal limits   URINALYSIS, W/ REFLEX TO CULTURE (INFECTION SUSPECTED) - Abnormal; Notable for the following components:   Hgb urine dipstick MODERATE (*)    Ketones, ur 5 (*)    Bacteria, UA RARE (*)    All other components within normal limits  VITAMIN B12 - Abnormal; Notable for the following components:   Vitamin B-12 1,167 (*)    All other components within normal limits  IRON AND TIBC - Abnormal; Notable for the following components:   Iron 13 (*)    Saturation Ratios 4 (*)    All other components within normal limits  RETICULOCYTES - Abnormal; Notable for the following components:   RBC. 3.21 (*)    Immature Retic Fract 20.3 (*)    All other components within normal limits  BASIC METABOLIC PANEL - Abnormal; Notable for the following components:   Sodium 131 (*)    CO2 20 (*)    Glucose, Bld 103 (*)    BUN 26 (*)    Creatinine, Ser 1.34 (*)    Calcium 7.5 (*)    GFR, Estimated 41 (*)    All other components within normal limits  CBC - Abnormal; Notable for the following components:   RBC 3.05 (*)    Hemoglobin 7.4 (*)    HCT 24.1 (*)    MCV 79.0 (*)    MCH 24.3 (*)    RDW 17.4 (*)    Platelets 131 (*)    All other components within normal limits  CBC - Abnormal; Notable for the following components:   RBC 3.13 (*)    Hemoglobin 7.4 (*)    HCT 24.2 (*)    MCV 77.3 (*)    MCH 23.6 (*)    RDW 17.1 (*)    Platelets 142 (*)    All other components within normal limits  POC OCCULT BLOOD, ED - Abnormal; Notable for the following components:   Fecal Occult Bld POSITIVE (*)    All other components within normal limits  TROPONIN I (HIGH SENSITIVITY) - Abnormal; Notable for the following components:   Troponin I (High Sensitivity) 25 (*)    All other components within normal limits  TROPONIN I (HIGH SENSITIVITY) - Abnormal; Notable for the following components:   Troponin I (High Sensitivity) 20 (*)    All other components within normal limits  CULTURE, BLOOD (ROUTINE X 2)  CULTURE,  BLOOD (ROUTINE X 2)  SARS CORONAVIRUS 2 BY RT PCR  URINE CULTURE  GASTROINTESTINAL PANEL BY PCR, STOOL (REPLACES STOOL CULTURE)  EXPECTORATED SPUTUM ASSESSMENT W GRAM STAIN, RFLX TO RESP C  LIPASE, BLOOD  LACTIC ACID, PLASMA  LACTIC ACID, PLASMA  FOLATE  FERRITIN  STREP PNEUMONIAE URINARY ANTIGEN  LEGIONELLA PNEUMOPHILA SEROGP 1 UR AG  TYPE AND SCREEN  ABO/RH   ____________________________________________  EKG  EKG Interpretation  Date/Time:  Wednesday Jul 19 2022 14:03:42 EDT Ventricular Rate:  79 PR Interval:  161 QRS Duration: 99 QT Interval:  397 QTC Calculation: 456 R Axis:   59 Text Interpretation: Sinus rhythm Borderline T abnormalities, anterior leads Confirmed by Alona Bene [phone]) on 07/19/2022 2:27:57 PM        ____________________________________________  RADIOLOGY  DG Chest Portable 1 View  Result Date: 07/19/2022 CLINICAL DATA:  Shortness of breath, cough EXAM: PORTABLE CHEST 1 VIEW COMPARISON:  03/31/2018 FINDINGS: Transverse diameter of heart is slightly increased. There are no signs of pulmonary edema. Patchy infiltrates are seen in the left lower lung field. There is no significant pleural effusion or pneumothorax. There is implantable cardiac monitoring device in left chest wall. IMPRESSION: Patchy infiltrate in left lower lung field suggesting atelectasis/pneumonia. Electronically Signed   By: Ernie Avena M.D.   On: 07/19/2022 15:24    ____________________________________________   PROCEDURES  Procedure(s) performed:   Procedures  CRITICAL CARE Performed by: Maia Plan Total critical care time: 35 minutes Critical care time was exclusive of separately billable procedures and treating other patients. Critical care was necessary to treat or prevent imminent or life-threatening deterioration. Critical care was time spent personally by me on the following activities: development of treatment plan with patient and/or surrogate as  well as nursing, discussions with consultants, evaluation of patient's response to treatment, examination of patient, obtaining history from patient or surrogate, ordering and performing treatments and interventions, ordering and review of laboratory studies, ordering and review of radiographic studies, pulse oximetry and re-evaluation of patient's condition.  Alona Bene, MD Emergency Medicine  ____________________________________________   INITIAL IMPRESSION / ASSESSMENT AND PLAN / ED COURSE  Pertinent labs & imaging results that were available during my care of the patient were reviewed by me and considered in my medical decision making (see chart for details).   This patient is Presenting for Evaluation of cough/diarrhea, which does require a range of treatment options, and is a complaint that involves a high risk of morbidity and mortality.  The Differential Diagnoses include CAP, Sepsis, UGIB, c diff, etc.  Critical Interventions-    Medications  azithromycin (ZITHROMAX) tablet 500 mg (500 mg Oral Given 07/20/22 0909)  cefTRIAXone (ROCEPHIN) 2 g in sodium chloride 0.9 % 100 mL IVPB (has no administration in time range)  acetaminophen (TYLENOL) tablet 650 mg (has no administration in time range)    Or  acetaminophen (TYLENOL) suppository 650 mg (has no administration in time range)  HYDROcodone-acetaminophen (NORCO/VICODIN) 5-325 MG per tablet 1 tablet (has no administration in time range)  albuterol (PROVENTIL) (2.5 MG/3ML) 0.083% nebulizer solution 2.5 mg (has no administration in time range)  hydrALAZINE (APRESOLINE) injection 5 mg (has no administration in time range)  0.9 %  sodium chloride infusion ( Intravenous New Bag/Given 07/19/22 1841)  pantoprazole (PROTONIX) injection 40 mg (40 mg Intravenous Given 07/20/22 0909)  atorvastatin (LIPITOR) tablet 80 mg (80 mg Oral Given 07/19/22 1837)  busPIRone (BUSPAR) tablet 5 mg (has no administration in time range)  buPROPion  (WELLBUTRIN XL) 24 hr tablet 300 mg (300 mg Oral Given 07/20/22 0910)  carvedilol (COREG) tablet 3.125 mg (3.125 mg Oral Given 07/20/22 0909)  cyanocobalamin (VITAMIN B12) tablet 1,000 mcg (0 mcg Oral Hold 07/20/22 0910)  gabapentin (NEURONTIN) capsule 100 mg (100 mg Oral Given 07/20/22 0909)  pantoprazole (PROTONIX) 80 mg /NS 100 mL IVPB (0 mg Intravenous Stopped 07/19/22 1630)  cefTRIAXone (ROCEPHIN) 1 g in sodium chloride 0.9 %  100 mL IVPB (0 g Intravenous Stopped 07/19/22 1827)    Reassessment after intervention: Remains HDS.    I did obtain Additional Historical Information from EMS.  I decided to review pertinent External Data, and in summary patient follows with [facility] for A fib.   Clinical Laboratory Tests Ordered, included anemia with hemoglobin drifting down to the 7 range from 10 in prior labs. No AKI.   Radiologic Tests Ordered, included CXR. I independently interpreted the images and agree with radiology interpretation.   Cardiac Monitor Tracing which shows NSR.    Social Determinants of Health Risk patient is a non-smoker.   Consult complete with GI. They will consult.   Discussed patient's case with [facility] to request admission. Patient and family (if present) updated with plan.   I reviewed all nursing notes, vitals, pertinent old records, EKGs, labs, imaging (as available).   Medical Decision Making: Summary:  Presents to the emergency department with cough for the past week and new onset diarrhea which is black in color.  Differential includes upper GI bleed and she is anticoagulated.  No hypotension.  Overall vitals are within normal limits.  She does become more short of breath with even movement in the bed leading to a brief episode of hypoxemia to the mid 80s.  I did restart nasal cannula oxygen at bedside.   Reevaluation with update and discussion with patient. Plan for abx and admit. She is in agreement.   Patient's presentation is most consistent with acute  presentation with potential threat to life or bodily function.   Disposition: admit  ____________________________________________  FINAL CLINICAL IMPRESSION(S) / ED DIAGNOSES  Final diagnoses:  Community acquired pneumonia, unspecified laterality  Gastrointestinal hemorrhage, unspecified gastrointestinal hemorrhage type    Note:  This document was prepared using Dragon voice recognition software and may include unintentional dictation errors.  Alona Bene, MD, [facility] Emergency Medicine    Nhyira Leano, Arlyss Repress, MD 07/20/22 [phone]

## 2022-07-19 NOTE — ED Triage Notes (Addendum)
Pt bib ems from home with reports of gen weakness along with cough and diarrhea. Diarrhea is dark in color, pt on eliquis. Given 600cc LR en route.  GCS 15 RR 30 Cap 17-25 BP 130/80 HR 70's

## 2022-07-19 NOTE — Progress Notes (Signed)
Patient admitted from the ed with PNA and diarrhea. Patient is a.o x4. Mae's x4. MP shows atrial fib. Patient is on 2l nasal cannula. Patient coughing nonproductive. Patient has a bruise on her bottom. Patients skin is dry and flaky.

## 2022-07-19 NOTE — ED Notes (Signed)
ED TO INPATIENT HANDOFF REPORT  ED Nurse Name and Phone #: [phone]  S Name/Age/Gender Ariel [name] 77 y.o. female Room/Bed: 004C/004C  Code Status   Code Status: Full Code  Home/SNF/Other Home Patient oriented to: self, place, time, and situation Is this baseline? Yes   Triage Complete: Triage complete  Chief Complaint Pneumonia [J18.9]  Triage Note Pt bib ems from home with reports of gen weakness along with cough and diarrhea. Diarrhea is dark in color, pt on eliquis. Given 600cc LR en route.  GCS 15 RR 30 Cap 17-25 BP 130/80 HR 70's   Allergies Allergies  Allergen Reactions   Lisinopril     Other reaction(s): Cough   Perflutren Lipid Microspheres     Other reaction(s): Abdominal Pain, Muscle Pain    Level of Care/Admitting Diagnosis ED Disposition     ED Disposition  Admit   Condition  --   Comment  H[facility] Area: [facility] [[id number]]  Level of Care: Telemetry Medical [[id number]]  May admit patient to [facility] or [facility] if equivalent level of care is available:: No  Covid Evaluation: Asymptomatic - no recent exposure (last 10 days) testing not required  Diagnosis: Pneumonia [469629]  Admitting Physician: Chiquita Loth  Attending Physician: Randol Kern, DAWOOD S [[id number]]  Bed request comments: 5 west if available  Certification:: I certify this patient will need inpatient services for at least 2 midnights  Estimated Length of Stay: 3          B Medical/Surgery History Past Medical History:  Diagnosis Date   Hypertension    Stroke [facility])    Past Surgical History:  Procedure Laterality Date   TUBAL LIGATION     Performed at the age of 77 years old     A IV Location/Drains/Wounds Patient Lines/Drains/Airways Status     Active Line/Drains/Airways     Name Placement date Placement time Site Days   Peripheral IV 07/19/22 20 G Left Antecubital 07/19/22  1420  Antecubital  less than 1             Intake/Output Last 24 hours No intake or output data in the 24 hours ending 07/19/22 1652  Labs/Imaging Results for orders placed or performed during the hospital encounter of 07/19/22 (from the past 48 hour(s))  Comprehensive metabolic panel     Status: Abnormal   Collection Time: 07/19/22  2:10 PM  Result Value Ref Range   Sodium 130 (L) 135 - 145 mmol/L   Potassium 4.4 3.5 - 5.1 mmol/L   Chloride 101 98 - 111 mmol/L   CO2 18 (L) 22 - 32 mmol/L   Glucose, Bld 124 (H) 70 - 99 mg/dL    Comment: Glucose reference range applies only to samples taken after fasting for at least 8 hours.   BUN 37 (H) 8 - 23 mg/dL   Creatinine, Ser 5.28 (H) 0.44 - 1.00 mg/dL   Calcium 8.2 (L) 8.9 - 10.3 mg/dL   Total Protein 6.3 (L) 6.5 - 8.1 g/dL   Albumin 3.2 (L) 3.5 - 5.0 g/dL   AST 65 (H) 15 - 41 U/L   ALT 25 0 - 44 U/L   Alkaline Phosphatase 33 (L) 38 - 126 U/L   Total Bilirubin 0.6 0.3 - 1.2 mg/dL   GFR, Estimated 31 (L) >60 mL/min    Comment: (NOTE) Calculated using the CKD-EPI Creatinine Equation (2021)    Anion gap 11 5 - 15    Comment: Performed at  [facility], [address], [place], [place] [address]  Lipase, blood     Status: None   Collection Time: 07/19/22  2:10 PM  Result Value Ref Range   Lipase 26 11 - 51 U/L    Comment: Performed at [facility], [address] [place], [place] [address]  Lactic acid, plasma     Status: None   Collection Time: 07/19/22  2:10 PM  Result Value Ref Range   Lactic Acid, Venous 1.9 0.5 - 1.9 mmol/L    Comment: Performed at [facility], [address] [place], [place] [address]  Troponin I (High Sensitivity)     Status: Abnormal   Collection Time: 07/19/22  2:10 PM  Result Value Ref Range   Troponin I (High Sensitivity) 25 (H) <18 ng/L    Comment: (NOTE) Elevated high sensitivity troponin I (hsTnI) values and significant  changes across serial measurements may suggest ACS but many other  chronic and acute conditions  are known to elevate hsTnI results.  Refer to the "Links" section for chest pain algorithms and additional  guidance. Performed at [facility], [address], [place], [place] [address]   Brain natriuretic peptide     Status: Abnormal   Collection Time: 07/19/22  2:10 PM  Result Value Ref Range   B Natriuretic Peptide 231.2 (H) 0.0 - 100.0 pg/mL    Comment: Performed at [facility], [address] [place], [place] [address]  CBC with Differential     Status: Abnormal   Collection Time: 07/19/22  2:10 PM  Result Value Ref Range   WBC 6.5 4.0 - 10.5 K/uL   RBC 3.39 (L) 3.87 - 5.11 MIL/uL   Hemoglobin 7.9 (L) 12.0 - 15.0 g/dL   HCT 13.0 (L) 86.5 - 78.4 %   MCV 78.5 (L) 80.0 - 100.0 fL   MCH 23.3 (L) 26.0 - 34.0 pg   MCHC 29.7 (L) 30.0 - 36.0 g/dL   RDW 69.6 (H) 29.5 - 28.4 %   Platelets 132 (L) 150 - 400 K/uL    Comment: REPEATED TO VERIFY   nRBC 0.0 0.0 - 0.2 %   Neutrophils Relative % 76 %   Neutro Abs 4.9 1.7 - 7.7 K/uL   Lymphocytes Relative 19 %   Lymphs Abs 1.3 0.7 - 4.0 K/uL   Monocytes Relative 4 %   Monocytes Absolute 0.3 0.1 - 1.0 K/uL   Eosinophils Relative 0 %   Eosinophils Absolute 0.0 0.0 - 0.5 K/uL   Basophils Relative 0 %   Basophils Absolute 0.0 0.0 - 0.1 K/uL   Immature Granulocytes 1 %   Abs Immature Granulocytes 0.03 0.00 - 0.07 K/uL    Comment: Performed at [facility], [address], [place], [place] [address]  Protime-INR     Status: Abnormal   Collection Time: 07/19/22  2:10 PM  Result Value Ref Range   Prothrombin Time 17.9 (H) 11.4 - 15.2 seconds   INR 1.5 (H) 0.8 - 1.2    Comment: (NOTE) INR goal varies based on device and disease states. Performed at [facility], [address], [place], [place] [address]   POC occult blood, ED     Status: Abnormal   Collection Time: 07/19/22  2:21 PM  Result Value Ref Range   Fecal Occult Bld POSITIVE (A) NEGATIVE  SARS Coronavirus 2 by RT PCR (hospital order, performed in  [facility] hospital lab) *  cepheid single result test*     Status: None   Collection Time: 07/19/22  2:21 PM   Specimen: Nasal Swab  Result Value Ref Range   SARS Coronavirus 2 by RT PCR NEGATIVE NEGATIVE    Comment: Performed at [facility], [address] [place], [place] [address]  Type and screen [facility]     Status: None (Preliminary result)   Collection Time: 07/19/22  4:00 PM  Result Value Ref Range   ABO/RH(D) PENDING    Antibody Screen PENDING    Sample Expiration      07/22/2022,2359 Performed at [facility], [address], [place], [place] [address]    DG Chest Portable 1 View  Result Date: 07/19/2022 CLINICAL DATA:  Shortness of breath, cough EXAM: PORTABLE CHEST 1 VIEW COMPARISON:  03/31/2018 FINDINGS: Transverse diameter of heart is slightly increased. There are no signs of pulmonary edema. Patchy infiltrates are seen in the left lower lung field. There is no significant pleural effusion or pneumothorax. There is implantable cardiac monitoring device in left chest wall. IMPRESSION: Patchy infiltrate in left lower lung field suggesting atelectasis/pneumonia. Electronically Signed   By: Ernie Avena M.D.   On: 07/19/2022 15:24    Pending Labs Unresulted Labs (From admission, onward)     Start     Ordered   07/19/22 1634  Respiratory (~20 pathogens) panel by PCR  (Respiratory panel by PCR (~20 pathogens, ~24 hr TAT)  w precautions)  Once,   R        07/19/22 1633   07/19/22 1634  Gastrointestinal Panel by PCR , Stool  (Gastrointestinal Panel by PCR, Stool                                                                                                                                                     **Does Not include CLOSTRIDIUM DIFFICILE testing. **If CDIFF testing is needed, place order from the "C Difficile Testing" order set.**)  Once,   R        07/19/22 1633   07/19/22 1421  Culture, blood (routine x 2)  BLOOD CULTURE X 2,   R       07/19/22 1421   07/19/22 1421  Lactic acid, plasma  Now then every 2 hours,   R      07/19/22 1421   07/19/22 1421  Urinalysis, w/ Reflex to Culture (Infection Suspected) -Urine, Clean Catch  Once,   URGENT       Question:  Specimen Source  Answer:  Urine, Clean Catch   07/19/22 1421   07/19/22 1421  Urine Culture  Once,   URGENT       Question:  Indication  Answer:  Altered mental status (if no other cause identified)   07/19/22 1421   Signed and Held  Strep pneumoniae urinary antigen  (COPD / Pneumonia / Cellulitis / Lower Extremity Wound)  Once,   R        Signed and Held   Signed and Held  Legionella Pneumophila Serogp 1 Ur Ag  (COPD / Pneumonia / Cellulitis / Lower Extremity Wound)  Once,   R        Signed and Held   Signed and Held  Expectorated Sputum Assessment w Gram Stain, Rflx to [employer]  (COPD / Pneumonia / Cellulitis / Lower Extremity Wound)  Once,   R        Signed and Held   Signed and Held  Basic metabolic panel  Tomorrow morning,   R        Signed and Held   Signed and Held  CBC  Tomorrow morning,   R        Signed and Held            Vitals/Pain Today's Vitals   07/19/22 1403 07/19/22 1404 07/19/22 1430 07/19/22 1445  BP: 130/84  (!) 118/52 (!) 133/53  Pulse: 80  77 77  Resp: (!) 26  (!) 24 (!) 25  Temp: 97.8 F (36.6 C)     SpO2: 100%  100% 99%  PainSc:  0-No pain      Isolation Precautions Enteric precautions (UV disinfection)  Medications Medications  cefTRIAXone (ROCEPHIN) 1 g in sodium chloride 0.9 % 100 mL IVPB (1 g Intravenous New Bag/Given 07/19/22 1632)  azithromycin (ZITHROMAX) tablet 500 mg (has no administration in time range)  pantoprazole (PROTONIX) 80 mg /NS 100 mL IVPB (0 mg Intravenous Stopped 07/19/22 1630)    Mobility walks with device     Focused Assessments Pulmonary Assessment Handoff:  Lung sounds:   Oxygen 2-3L      R Recommendations: See Admitting Provider Note  Report given to:   Additional Notes:

## 2022-07-20 DIAGNOSIS — J180 Bronchopneumonia, unspecified organism: Secondary | ICD-10-CM

## 2022-07-20 DIAGNOSIS — K921 Melena: Secondary | ICD-10-CM | POA: Diagnosis not present

## 2022-07-20 DIAGNOSIS — I4891 Unspecified atrial fibrillation: Secondary | ICD-10-CM

## 2022-07-20 DIAGNOSIS — J189 Pneumonia, unspecified organism: Secondary | ICD-10-CM | POA: Diagnosis not present

## 2022-07-20 DIAGNOSIS — D62 Acute posthemorrhagic anemia: Secondary | ICD-10-CM

## 2022-07-20 DIAGNOSIS — Z7901 Long term (current) use of anticoagulants: Secondary | ICD-10-CM

## 2022-07-20 DIAGNOSIS — I63522 Cerebral infarction due to unspecified occlusion or stenosis of left anterior cerebral artery: Secondary | ICD-10-CM | POA: Diagnosis not present

## 2022-07-20 DIAGNOSIS — K219 Gastro-esophageal reflux disease without esophagitis: Secondary | ICD-10-CM

## 2022-07-20 DIAGNOSIS — E871 Hypo-osmolality and hyponatremia: Secondary | ICD-10-CM

## 2022-07-20 LAB — URINALYSIS, W/ REFLEX TO CULTURE (INFECTION SUSPECTED)
Bilirubin Urine: NEGATIVE
Glucose, UA: NEGATIVE mg/dL
Ketones, ur: 5 mg/dL — AB
Leukocytes,Ua: NEGATIVE
Nitrite: NEGATIVE
Protein, ur: NEGATIVE mg/dL
Specific Gravity, Urine: 1.014 (ref 1.005–1.030)
pH: 5 (ref 5.0–8.0)

## 2022-07-20 LAB — BASIC METABOLIC PANEL
Anion gap: 10 (ref 5–15)
BUN: 26 mg/dL — ABNORMAL HIGH (ref 8–23)
CO2: 20 mmol/L — ABNORMAL LOW (ref 22–32)
Calcium: 7.5 mg/dL — ABNORMAL LOW (ref 8.9–10.3)
Chloride: 101 mmol/L (ref 98–111)
Creatinine, Ser: 1.34 mg/dL — ABNORMAL HIGH (ref 0.44–1.00)
GFR, Estimated: 41 mL/min — ABNORMAL LOW (ref >60)
Glucose, Bld: 103 mg/dL — ABNORMAL HIGH (ref 70–99)
Potassium: 3.5 mmol/L (ref 3.5–5.1)
Sodium: 131 mmol/L — ABNORMAL LOW (ref 135–145)

## 2022-07-20 LAB — CBC
HCT: 24.1 % — ABNORMAL LOW (ref 36.0–46.0)
Hemoglobin: 7.4 g/dL — ABNORMAL LOW (ref 12.0–15.0)
MCH: 24.3 pg — ABNORMAL LOW (ref 26.0–34.0)
MCHC: 30.7 g/dL (ref 30.0–36.0)
MCV: 79 fL — ABNORMAL LOW (ref 80.0–100.0)
Platelets: 131 10*3/uL — ABNORMAL LOW (ref 150–400)
RBC: 3.05 MIL/uL — ABNORMAL LOW (ref 3.87–5.11)
RDW: 17.4 % — ABNORMAL HIGH (ref 11.5–15.5)
WBC: 4.4 10*3/uL (ref 4.0–10.5)
nRBC: 0 % (ref 0.0–0.2)

## 2022-07-20 LAB — HEMOGLOBIN: Hemoglobin: 7.4 g/dL — ABNORMAL LOW (ref 12.0–15.0)

## 2022-07-20 LAB — STREP PNEUMONIAE URINARY ANTIGEN: Strep Pneumo Urinary Antigen: NEGATIVE

## 2022-07-20 LAB — CULTURE, BLOOD (ROUTINE X 2): Special Requests: ADEQUATE

## 2022-07-20 MED ORDER — BENZONATATE 100 MG PO CAPS
200.0000 mg | ORAL_CAPSULE | Freq: Two times a day (BID) | ORAL | Status: DC | PRN
  Administered 2022-07-20 – 2022-07-27 (×6): 200 mg via ORAL
  Filled 2022-07-20 (×7): qty 2

## 2022-07-20 MED ORDER — VITAMIN B-12 1000 MCG PO TABS
500.0000 ug | ORAL_TABLET | Freq: Every day | ORAL | Status: DC
  Administered 2022-07-21 – 2022-07-29 (×8): 500 ug via ORAL
  Filled 2022-07-20 (×8): qty 1

## 2022-07-20 NOTE — TOC Initial Note (Addendum)
Transition of Care [facility]) - Initial/Assessment Note    Patient Details  Name: Ariel [name] MRN: [id number] Date of Birth: [birth year]  Transition of Care [facility]) CM/SW Contact:    Gordy Clement, RN Phone Number: 07/20/2022, 3:38 PM  Clinical Narrative:       Patient from home with Niece.  Septicemia .  PT and OT are recommending Home Health .  Patient is set up with [facility] ( referral from PCP for patient prior to admission)  Orders for [facility] PT and OT entered . No DME recommended . TOC will continue to follow patient for any additional discharge needs            Expected Discharge Plan: Home w Home Health Services Barriers to Discharge: Continued Medical Work up   Patient Goals and CMS Choice            Expected Discharge Plan and Services     Post Acute Care Choice: Home Health Living arrangements for the past 2 months: Single Family Home                           HH Arranged: PT, OT HH Agency: [facility] Date [facility] Agency Contacted: 07/20/22 Time HH Agency Contacted: 1532 Representative spoke with at [facility] Agency: Christian  Prior Living Arrangements/Services Living arrangements for the past 2 months: Single Family Home Lives with:: Relatives (Niece)   Do you feel safe going back to the place where you live?: Yes               Activities of Daily Living      Permission Sought/Granted                  Emotional Assessment           Psych Involvement: No (comment)  Admission diagnosis:  Pneumonia [J18.9] Gastrointestinal hemorrhage, unspecified gastrointestinal hemorrhage type [K92.2] Community acquired pneumonia, unspecified laterality [J18.9] Patient Active Problem List   Diagnosis Date Noted   Pneumonia 07/19/2022   Normocytic anemia 07/19/2022   Preoperative cardiovascular examination 05/16/2022   Paroxysmal atrial fibrillation (HCC) 08/11/2021   Secondary hypercoagulable state (HCC) 08/11/2021   Acute CVA (cerebrovascular  accident) (HCC) 01/17/2021   CVA (cerebral vascular accident) (HCC) 08/27/2020   Acute renal failure superimposed on stage 3b chronic kidney disease (HCC) 08/27/2020   Benign essential HTN 08/27/2020   Anxiety and depression 08/27/2020   GERD (gastroesophageal reflux disease) 08/27/2020   Stage 3b chronic kidney disease (HCC) 10/20/2019   PCP:  Macy Mis, MD Pharmacy:   [id number] [place], [place] - [address] AT [facility] [address] [place] [address] Phone: [phone] Fax: [phone]  [facility] - [place], [place] - [address] [place] [address] Phone: [phone] Fax: [phone]  [facility] [address] [place], [place] - [address] [place] [address] Phone: [phone] Fax: [phone]     Social Determinants of Health (SDOH) Social History: SDOH Screenings   Depression (PHQ2-9): Low Risk  (10/07/2020)  Tobacco Use: Low Risk  (07/19/2022)   SDOH Interventions:     Readmission Risk Interventions     No data to display

## 2022-07-20 NOTE — Consult Note (Addendum)
Referring Provider:  EDP Primary Care Physician:  Macy Mis, MD Primary Gastroenterologist:  [facility], Dr. Caryl Never  Reason for Consultation:  Black stool/heme positive  HPI: Ariel [name] is a 77 y.o. female with PMH of HTN and hx of stroke with residual left sided weakness, HLD, GERD, CKD stage 3b, depression, atrial fibrillation on Eliquis who presented to ED with complaints of cough, shortness of breath, and diarrhea dark stools for 2-3 days.  All of her symptoms started at the same time.  Hgb down about 2 grams from a couple of months ago.  It is stable this AM at 7.4 grams.  Tested positive for Metapnuemovirus on respiratory panel.  On 2L O2 here, none at home, but does not look in distress.  Eliquis is on hold.  Feeling better this AM but has an awful dry cough.  Says that she takes protonix daily at home for acid reflux but even despite that she still has issues with reflux quite frequently.  No history of EGD that she can recall.  No NSAID use, no dysphagia.  Colonoscopy 10/24/2021 significant for multiple small and large polyps, tubular adenomas.  Past Medical History:  Diagnosis Date   Hypertension    Stroke [facility])     Past Surgical History:  Procedure Laterality Date   TUBAL LIGATION     Performed at the age of 77 years old    Prior to Admission medications   Medication Sig Start Date End Date Taking? Authorizing Provider  alendronate (FOSAMAX) 70 MG tablet Take 70 mg by mouth once a week.   Yes [provider]  amLODipine (NORVASC) 10 MG tablet Take 1 tablet by mouth at bedtime. 04/20/22  Yes [provider]  apixaban (ELIQUIS) 5 MG TABS tablet TAKE 1 TABLET TWICE DAILY. APPOINTMENT REQUIRED FOR REFILLS [id number] Patient taking differently: Take 5 mg by mouth 2 (two) times daily. 02/13/22  Yes Fenton, Clint R, PA  atorvastatin (LIPITOR) 80 MG tablet Take 80 mg by mouth every evening. 12/16/20  Yes [provider]  buPROPion (WELLBUTRIN XL)  300 MG 24 hr tablet Take 300 mg by mouth daily. 06/14/21  Yes [provider]  busPIRone (BUSPAR) 5 MG tablet Take 5 mg by mouth 2 (two) times daily as needed.   Yes [provider]  carvedilol (COREG) 3.125 MG tablet Take 1 tablet (3.125 mg total) by mouth 2 (two) times daily. 01/20/21  Yes Leroy Sea, MD  cyanocobalamin (VITAMIN B12) 1000 MCG tablet Take 1,000 mcg by mouth daily.   Yes [provider]  famotidine (PEPCID) 20 MG tablet Take 20 mg by mouth daily.   Yes [provider]  fenofibrate micronized (LOFIBRA) 200 MG capsule Take 200 mg by mouth at bedtime.   Yes [provider]  fexofenadine (ALLEGRA) 180 MG tablet Take 180 mg by mouth daily as needed for allergies.   Yes [provider]  fluticasone (FLONASE) 50 MCG/ACT nasal spray Place 1 spray into both nostrils as needed. 11/03/13 07/19/22 Yes [provider]  hydrochlorothiazide (HYDRODIURIL) 12.5 MG tablet Take 12.5 mg by mouth daily. 08/03/21  Yes [provider]  losartan (COZAAR) 50 MG tablet TAKE 1 TABLET(50 MG) BY MOUTH DAILY 06/20/21  Yes [provider]  pantoprazole (PROTONIX) 40 MG tablet TAKE 1 TABLET EVERY DAY 06/29/22  Yes McCue, Shanda Bumps, NP  gabapentin (NEURONTIN) 100 MG capsule Take 100 mg by mouth as needed.    [provider]    Current  Facility-Administered Medications  Medication Dose Route Frequency Provider Last Rate Last Admin   0.9 %  sodium chloride infusion   Intravenous Continuous Elgergawy, Leana Roe, MD 50 mL/hr at 07/19/22 1841 New Bag at 07/19/22 1841   acetaminophen (TYLENOL) tablet 650 mg  650 mg Oral Q6H PRN Elgergawy, Leana Roe, MD       Or   acetaminophen (TYLENOL) suppository 650 mg  650 mg Rectal Q6H PRN Elgergawy, Leana Roe, MD       albuterol (PROVENTIL) (2.5 MG/3ML) 0.083% nebulizer solution 2.5 mg  2.5 mg Nebulization Q2H PRN Elgergawy, Leana Roe, MD       atorvastatin (LIPITOR) tablet 80 mg  80 mg Oral  QPM Elgergawy, Leana Roe, MD   80 mg at 07/19/22 1837   azithromycin (ZITHROMAX) tablet 500 mg  500 mg Oral Daily Elgergawy, Leana Roe, MD   500 mg at 07/20/22 8657   buPROPion (WELLBUTRIN XL) 24 hr tablet 300 mg  300 mg Oral Daily Elgergawy, Leana Roe, MD   300 mg at 07/20/22 0910   busPIRone (BUSPAR) tablet 5 mg  5 mg Oral BID PRN Elgergawy, Leana Roe, MD       carvedilol (COREG) tablet 3.125 mg  3.125 mg Oral BID Elgergawy, Leana Roe, MD   3.125 mg at 07/20/22 8469   cefTRIAXone (ROCEPHIN) 2 g in sodium chloride 0.9 % 100 mL IVPB  2 g Intravenous Q24H Elgergawy, Leana Roe, MD       cyanocobalamin (VITAMIN B12) tablet 1,000 mcg  1,000 mcg Oral Daily Elgergawy, Leana Roe, MD   1,000 mcg at 07/19/22 1838   gabapentin (NEURONTIN) capsule 100 mg  100 mg Oral BID Elgergawy, Leana Roe, MD   100 mg at 07/20/22 6295   hydrALAZINE (APRESOLINE) injection 5 mg  5 mg Intravenous Q4H PRN Elgergawy, Leana Roe, MD       HYDROcodone-acetaminophen (NORCO/VICODIN) 5-325 MG per tablet 1 tablet  1 tablet Oral Q6H PRN Elgergawy, Leana Roe, MD       pantoprazole (PROTONIX) injection 40 mg  40 mg Intravenous Q12H Elgergawy, Leana Roe, MD   40 mg at 07/20/22 0909    Allergies as of 07/19/2022 - Review Complete 07/19/2022  Allergen Reaction Noted   Lisinopril  10/16/2017   Perflutren lipid microspheres  05/31/2020    Family History  Problem Relation Age of Onset   COPD Mother    Heart disease Father    CVA Maternal Grandmother    Heart disease Paternal Grandmother    CVA Paternal Grandfather     Social History   Socioeconomic History   Marital status: Widowed    Spouse name: Not on file   Number of children: Not on file   Years of education: Not on file   Highest education level: Not on file  Occupational History   Not on file  Tobacco Use   Smoking status: Never   Smokeless tobacco: Never  Vaping Use   Vaping Use: Never used  Substance and Sexual Activity   Alcohol use: Never   Drug use: Never   Sexual  activity: Not on file  Other Topics Concern   Not on file  Social History Narrative   Not on file   Social Determinants of Health   Financial Resource Strain: Not on file  Food Insecurity: Not on file  Transportation Needs: Not on file  Physical Activity: Not on file  Stress: Not on file  Social Connections: Not on file  Intimate Partner Violence: Not  on file    Review of Systems: ROS is O/W negative except as mentioned in HPI.  Physical Exam: Vital signs in last 24 hours: Temp:  [97.8 F (36.6 C)-98.9 F (37.2 C)] 98.9 F (37.2 C) (05/16 0744) Pulse Rate:  [73-83] 73 (05/16 0319) Resp:  [15-31] 25 (05/16 0319) BP: (110-142)/(43-105) 131/45 (05/16 0744) SpO2:  [94 %-100 %] 94 % (05/16 0319) Last BM Date : 07/19/22 General:  Alert, Well-developed, well-nourished, pleasant and cooperative in NAD Head:  Normocephalic and atraumatic. Eyes:  Sclera clear, no icterus.  Conjunctiva pink. Ears:  Normal auditory acuity. Mouth:  No deformity or lesions.   Lungs:  Clear throughout to auscultation bilaterally anteriorly. Heart:  Regular rate and rhythm; no murmurs, clicks, rubs, or gallops. Abdomen:  Soft, non-distended.  BS present.  Non-tender.   Rectal:  Deferred  Msk:  Symmetrical without gross deformities.  Pulses:  Normal pulses noted. Extremities:  Without clubbing or edema. Neurologic:  Alert and oriented x 4;  grossly normal neurologically. Skin:  Intact without significant lesions or rashes. Psych:  Alert and cooperative. Normal mood and affect.  Intake/Output from previous day: 05/15 0701 - 05/16 0700 In: 543.3 [I.V.:543.3] Out: 200 [Urine:200] Intake/Output this shift:  Lab Results: Recent Labs    07/19/22 1410 07/19/22 2142 07/20/22 0349  WBC 6.5 5.1 4.4  HGB 7.9* 7.4* 7.4*  HCT 26.6* 24.2* 24.1*  PLT 132* 142* 131*   BMET Recent Labs    07/19/22 1410 07/20/22 0349  NA 130* 131*  K 4.4 3.5  CL 101 101  CO2 18* 20*  GLUCOSE 124* 103*  BUN 37*  26*  CREATININE 1.69* 1.34*  CALCIUM 8.2* 7.5*   LFT Recent Labs    07/19/22 1410  PROT 6.3*  ALBUMIN 3.2*  AST 65*  ALT 25  ALKPHOS 33*  BILITOT 0.6   PT/INR Recent Labs    07/19/22 1410  LABPROT 17.9*  INR 1.5*   Studies/Results: DG Chest Portable 1 View  Result Date: 07/19/2022 CLINICAL DATA:  Shortness of breath, cough EXAM: PORTABLE CHEST 1 VIEW COMPARISON:  03/31/2018 FINDINGS: Transverse diameter of heart is slightly increased. There are no signs of pulmonary edema. Patchy infiltrates are seen in the left lower lung field. There is no significant pleural effusion or pneumothorax. There is implantable cardiac monitoring device in left chest wall. IMPRESSION: Patchy infiltrate in left lower lung field suggesting atelectasis/pneumonia. Electronically Signed   By: Ernie Avena M.D.   On: 07/19/2022 15:24    IMPRESSION:  *Dark/black stool, heme positive:  On pantoprazole 40 mg IV BID here. *Acute on chronic anemia:  Hgb down about 2 grams from a couple of months ago.  It is stable this AM at 7.4 grams. *Cough/left lower lobe pneumonia:  Tested positive for Metapnuemovirus on respiratory panel.  On 2L O2 here, none at home, but does not look in distress. *Atrial fibrillation on Eliquis:  Eliquis is on hold.   *GERD:  Reports frequent reflux at home despite pantoprazole 40 mg daily.  No history of EGD to her knowledge. *Hyponatremia:  Na+ is 131.  PLAN: -May need EGD at some point, ? Timing depending on respiratory status.  ? 5/17. -Will allow clear liquids today. -Trend Hgb. -They had ordered a Cdiff, but I had it canceled for now.  Only had one soft stool since admission.  The "diarrhea" could be due to this virus and also it will be soft/loose in the setting of a GI bleed. -Continue pantoprazole 40  mg IV BID.   Princella Pellegrini. Zehr  07/20/2022, 9:37 AM  I have taken an interval history, thoroughly reviewed the chart and examined the patient. I agree with the Advanced  Practitioner's note, impression and recommendations, and have recorded additional findings, impressions and recommendations below. I performed a substantive portion of this encounter (>50% time spent), including a complete performance of the medical decision making.  My additional thoughts are as follows:  Probable upper GI bleeding and patient on oral anticoagulation.  Consider peptic ulcer disease, AVM, Dieulafoy or severe ulcerative esophagitis in a patient on a bisphosphonate. She does not appear to have hemodynamically significant GI bleeding at present, her BUN is slowly decreasing and hemoglobin appears to be stabilizing while on acid suppression and off OAC since admission yesterday.   She has an unrelenting dry cough that will make procedural sedation challenging, particularly if there is any endoscopic intervention needed that would lead to a prolonged endoscopic procedure.  Therefore, I recommend holding off on endoscopy until we can get hopeful improvement in her cough.  Perhaps a cough suppressant with codeine would be prudent, but of course I will leave this to the discretion of the hospitalist.  Twice daily acid suppression, oral if she is able to do so. Continue clear liquid diet, but will add boost breeze supplements for some protein calorie nutrition.  Will check back with her tomorrow, please call the overnight physician if there appears to be brisk GI bleeding that may require urgent attention.  Upper endoscopy is planned for some time prior to discharge, exact timing depending on her clinical progress.  Every 12 hours hemoglobin and hematocrit, transfuse as needed.  Continue Protonix as noted above and hold oral anticoagulation.   Charlie Pitter III Office:[phone]

## 2022-07-20 NOTE — Evaluation (Signed)
Occupational Therapy Evaluation Patient Details Name: Ariel [name] MRN: [id number] DOB: [birth year] Today's Date: 07/20/2022   History of Present Illness Patient is 77 y.o. female who presented 07/19/22 to ED with cough, shortness of breath, and dark stools. ED workup significant for dehydration, sodium of 130, chest x-ray significant for LLL opacity, hemoglobin low at 7.9, down from 10.1 this March, colonoscopy last October at [facility] significant for polyps, patient reports she never had endoscopy before, she denies any NSAIDs use, she was Hemoccult positive. Admitted for PNA and GIB. PMH significant for HTN, Stroke.   Clinical Impression   At baseline PLOF, pt is Independent to Mod I with ADLs and uses a [facility] for functional transfers/mobility. Pt states she has had 4-5 fall in the past month. Pt currently demonstrates ability to complete UB ADLs with Set up to Mod assist, LB ADLs with Max assist, and functional transfers/mobility with a RW (2 wheel) with Min assist. Pt also presents with decreased general UE strength, activity tolerance, and balance during functional tasks. Pt will benefit from acute skilled OT services to address deficits outlined below and to increase safety and independence with ADLs, functional transfers, and functional mobility. Post acute discharge, pt will benefit from continued skilled OT services in the home.      Recommendations for follow up therapy are one component of a multi-disciplinary discharge planning process, led by the attending physician.  Recommendations may be updated based on patient status, additional functional criteria and insurance authorization.   Assistance Recommended at Discharge Frequent or constant Supervision/Assistance  Patient can return home with the following A little help with walking and/or transfers;A lot of help with bathing/dressing/bathroom;Assistance with cooking/housework;Assist for transportation;Help with stairs or ramp for entrance     Functional Status Assessment  Patient has had a recent decline in their functional status and demonstrates the ability to make significant improvements in function in a reasonable and predictable amount of time.  Equipment Recommendations  Other (comment) (TBD pending pt progress)    Recommendations for Other Services       Precautions / Restrictions Precautions Precautions: Fall Precaution Comments: watch O2 Restrictions Weight Bearing Restrictions: No      Mobility Bed Mobility Overal bed mobility: Needs Assistance Bed Mobility: Supine to Sit     Supine to sit: Mod assist, HOB elevated     General bed mobility comments: Pt initially required assist to maintain upright midline sitting at EOB due to posterior lean. Pt required cues for technique.    Transfers Overall transfer level: Needs assistance Equipment used: Rolling walker (2 wheels) Transfers: Sit to/from Stand, Bed to chair/wheelchair/BSC Sit to Stand: Min assist     Step pivot transfers: Min assist     General transfer comment: with cues needed for safety and technique      Balance Overall balance assessment: Needs assistance Sitting-balance support: Bilateral upper extremity supported, Feet supported Sitting balance-Leahy Scale: Fair Sitting balance - Comments: Initially required assistance to obtain balance in midline sitting EOB.   Standing balance support: Single extremity supported, Bilateral upper extremity supported, During functional activity, Reliant on assistive device for balance Standing balance-Leahy Scale: Poor                             ADL either performed or assessed with clinical judgement   ADL Overall ADL's : Needs assistance/impaired Eating/Feeding: Set up   Grooming: Set up;Sitting   Upper Body Bathing: Moderate assistance;Sitting;Cueing for safety;Cueing  for compensatory techniques   Lower Body Bathing: Maximal assistance;Cueing for safety;Cueing for  compensatory techniques;Sit to/from stand   Upper Body Dressing : Minimal assistance;Sitting   Lower Body Dressing: Maximal assistance;Cueing for compensatory techniques;Cueing for safety;Sit to/from stand   Toilet Transfer: Minimal assistance;Cueing for safety;Cueing for sequencing;Rolling walker (2 wheels);BSC/3in1 (step-pivot)   Toileting- Clothing Manipulation and Hygiene: Maximal assistance;Cueing for safety;Cueing for compensatory techniques;Sit to/from stand       Functional mobility during ADLs: Minimal assistance;Cueing for safety;Rolling walker (2 wheels) General ADL Comments: Pt presents with decreased activity tolerance, general strenght, and balance during funcitonal tasks affecting independence and safety with ADLs.     Vision Baseline Vision/History: 1 Wears glasses (readers) Ability to See in Adequate Light: 0 Adequate Patient Visual Report: No change from baseline       Perception     Praxis Praxis Praxis tested?: Within functional limits    Pertinent Vitals/Pain Pain Assessment Pain Assessment: No/denies pain     Hand Dominance Right   Extremity/Trunk Assessment Upper Extremity Assessment Upper Extremity Assessment: Defer to OT evaluation   Lower Extremity Assessment Lower Extremity Assessment: Generalized weakness   Cervical / Trunk Assessment Cervical / Trunk Assessment: Normal   Communication Communication Communication: No difficulties   Cognition Arousal/Alertness: Awake/alert Behavior During Therapy: WFL for tasks assessed/performed Overall Cognitive Status: Within Functional Limits for tasks assessed                                       General Comments  VSS on 2L continuous O2 through nasal cannula.    Exercises     Shoulder Instructions      Home Living Family/patient expects to be discharged to:: Private residence Living Arrangements: Other relatives (Niece and nephew) Available Help at Discharge: Available 24  hours/day;Family Type of Home: House Home Access: Stairs to enter Secretary/administrator of Steps: 2 Entrance Stairs-Rails: Left Home Layout: One level     Bathroom Shower/Tub: Producer, television/film/video: Standard Bathroom Accessibility: No   Home Equipment: Pharmacist, hospital (2 wheels);Cane - single point   Additional Comments: 1 dog and 2 cats at home, niece works from home and is available to assist.      Prior Functioning/Environment Prior Level of Function : Independent/Modified Independent             Mobility Comments: pr reports use of SPC for mobility at home, she has had 4-5 falls in past month and reports she falls a lot. ADLs Comments: Independent to Mod I, reports she fell backwards in the shower the last time she got in.        OT Problem List: Decreased strength;Decreased activity tolerance;Impaired balance (sitting and/or standing);Decreased safety awareness;Decreased knowledge of use of DME or AE;Cardiopulmonary status limiting activity      OT Treatment/Interventions: Self-care/ADL training;Therapeutic exercise;Energy conservation;DME and/or AE instruction;Therapeutic activities;Patient/family education;Balance training    OT Goals(Current goals can be found in the care plan section) Acute Rehab OT Goals Patient Stated Goal: To return home OT Goal Formulation: With patient Time For Goal Achievement: 08/03/22 Potential to Achieve Goals: Good ADL Goals Pt Will Perform Upper Body Bathing: with supervision;sitting Pt Will Perform Lower Body Bathing: with min guard assist;sit to/from stand (with adaptive equipment as needed) Pt Will Transfer to Toilet: with supervision;ambulating;bedside commode Pt Will Perform Toileting - Clothing Manipulation and hygiene: with min guard assist;sit to/from stand Pt/caregiver will  Perform Home Exercise Program: Increased strength;Both right and left upper extremity;With theraputty;With written HEP  provided;With theraband Additional ADL Goal #1: Pt will demonstrate ability to Independently state three energy conservation strategies for increased safety and independence with ADLs.  OT Frequency: Min 2X/week    Co-evaluation PT/OT/SLP Co-Evaluation/Treatment: Yes Reason for Co-Treatment: For patient/therapist safety;To address functional/ADL transfers PT goals addressed during session: Mobility/safety with mobility;Balance;Proper use of DME OT goals addressed during session: ADL's and self-care      AM-PAC OT "6 Clicks" Daily Activity     Outcome Measure Help from another person eating meals?: A Little Help from another person taking care of personal grooming?: A Little Help from another person toileting, which includes using toliet, bedpan, or urinal?: A Lot Help from another person bathing (including washing, rinsing, drying)?: A Lot Help from another person to put on and taking off regular upper body clothing?: A Little Help from another person to put on and taking off regular lower body clothing?: A Lot 6 Click Score: 15   End of Session Equipment Utilized During Treatment: Gait belt;Rolling walker (2 wheels);Oxygen;Other (comment) [facility] Nurse Communication: Mobility status;Other (comment) (Pt sitting up in chair)  Activity Tolerance: Patient limited by fatigue Patient left: in chair;with call bell/phone within reach  OT Visit Diagnosis: Unsteadiness on feet (R26.81);Repeated falls (R29.6);Muscle weakness (generalized) (M62.81);History of falling (Z91.81);Other (comment) (Decreased activity tolerance, decreased balance)                Time: 1610-9604 OT Time Calculation (min): 33 min Charges:  OT General Charges $OT Visit: 1 Visit OT Evaluation $OT Eval Moderate Complexity: 1 Mod  [address] San Juan AvenueMolson Coors Brewing., OTR/L, MA Acute Rehab [phone]   Lendon Colonel 07/20/2022, 2:34 PM

## 2022-07-20 NOTE — Evaluation (Signed)
Physical Therapy Evaluation Patient Details Name: Ariel [name] MRN: [id number] DOB: 01-Feb-1946 Today's Date: 07/20/2022  History of Present Illness  Patient is 77 y.o. female who presented 07/19/22 to ED with cough, shortness of breath, and dark stools. ED workup significant for dehydration, sodium of 130, chest x-ray significant for LLL opacity, hemoglobin low at 7.9, down from 10.1 this March, colonoscopy last October at [facility] significant for polyps, patient reports she never had endoscopy before, she denies any NSAIDs use, she was Hemoccult positive. Admitted for PNA and GIB. PMH significant for HTN, Stroke.   Clinical Impression  Ariel [name] is 77 y.o. female admitted with above HPI and diagnosis. Patient is currently limited by functional impairments below (see PT problem list). Patient lives with her niece and nephew and is typically independent with SPC at baseline however has been falling frequently at home. Pt currently requires mod assist for bed mobility and min assist for transfer with RW. BP slight drop initially but recovered sitting on BSC, VSS throughout on 2L/min/ Pt required Mod assist to ambulate ~12' in room to sit up in recliner. Cues needed to continue advancing steps for greater gait speed and assist to steady balance. Patient will benefit from continued skilled PT interventions to address impairments and progress independence with mobility. Acute PT will follow and progress as able.        Recommendations for follow up therapy are one component of a multi-disciplinary discharge planning process, led by the attending physician.  Recommendations may be updated based on patient status, additional functional criteria and insurance authorization.  Follow Up Recommendations       Assistance Recommended at Discharge Frequent or constant Supervision/Assistance  Patient can return home with the following  A lot of help with walking and/or transfers;A little help with  bathing/dressing/bathroom;Assistance with cooking/housework;Direct supervision/assist for medications management;Assist for transportation;Help with stairs or ramp for entrance    Equipment Recommendations None recommended by PT  Recommendations for Other Services       Functional Status Assessment Patient has had a recent decline in their functional status and demonstrates the ability to make significant improvements in function in a reasonable and predictable amount of time.     Precautions / Restrictions Precautions Precautions: Fall Precaution Comments: watch O2 Restrictions Weight Bearing Restrictions: No      Mobility  Bed Mobility Overal bed mobility: Needs Assistance Bed Mobility: Supine to Sit     Supine to sit: HOB elevated, Mod assist     General bed mobility comments: cues to use bed rail and assist to raise trunk upright. Pt with posterior lean initially sitting EOB and assist needed to obtain upright midline balance with Bil UE support.    Transfers Overall transfer level: Needs assistance Equipment used: Rolling walker (2 wheels) Transfers: Sit to/from Stand, Bed to chair/wheelchair/BSC Sit to Stand: Min assist   Step pivot transfers: Min assist       General transfer comment: Min assist to power up to RW, cues for hand placement to initiate rise. Min assist to guied walker for turn and steady balance with small side steps bed<>BSC.    Ambulation/Gait Ambulation/Gait assistance: Mod assist Gait Distance (Feet): 12 Feet Assistive device: Rolling walker (2 wheels) Gait Pattern/deviations: Step-through pattern, Decreased stride length, Decreased dorsiflexion - right, Decreased dorsiflexion - left, Shuffle Gait velocity: decr/slow     General Gait Details: pt with slow shortened shuffled steps. pt required mod assist to guide walker with cues to direct steps for turn  around end of bed. Assist to manage IV and equipment in room.  Stairs             Wheelchair Mobility    Modified Rankin (Stroke Patients Only)       Balance Overall balance assessment: Needs assistance Sitting-balance support: Feet supported Sitting balance-Leahy Scale: Fair Sitting balance - Comments: initially assist needed to obtain balance, able to hold with bil UE use   Standing balance support: During functional activity, Reliant on assistive device for balance, Bilateral upper extremity supported Standing balance-Leahy Scale: Poor Standing balance comment: requires assist and attempted peircare with single UE support and min assist to stabilize.                             Pertinent Vitals/Pain Pain Assessment Pain Assessment: No/denies pain    Home Living Family/patient expects to be discharged to:: Private residence Living Arrangements: Other relatives (Niece and nephew) Available Help at Discharge: Available 24 hours/day;Family Type of Home: House Home Access: Stairs to enter Entrance Stairs-Rails: Left Entrance Stairs-Number of Steps: 2   Home Layout: One level Home Equipment: Pharmacist, hospital (2 wheels);Cane - single point Additional Comments: 1 dog and 2 cats at home, niece works from home and is available to assist.    Prior Function Prior Level of Function : Independent/Modified Independent             Mobility Comments: pr reports use of SPC for mobility at home, she has had 4-5 falls in past month and reports she falls a lot. ADLs Comments: Independent to Mod I, reports she fell backwards in the shower the last time she got in.     Hand Dominance   Dominant Hand: Right    Extremity/Trunk Assessment   Upper Extremity Assessment Upper Extremity Assessment: Defer to OT evaluation    Lower Extremity Assessment Lower Extremity Assessment: Generalized weakness    Cervical / Trunk Assessment Cervical / Trunk Assessment: Normal  Communication   Communication: No difficulties  Cognition  Arousal/Alertness: Awake/alert Behavior During Therapy: WFL for tasks assessed/performed Overall Cognitive Status: Within Functional Limits for tasks assessed                                          General Comments      Exercises     Assessment/Plan    PT Assessment Patient needs continued PT services  PT Problem List         PT Treatment Interventions DME instruction;Patient/family education;Cognitive remediation;Neuromuscular re-education;Balance training;Therapeutic exercise;Therapeutic activities;Functional mobility training;Stair training;Gait training    PT Goals (Current goals can be found in the Care Plan section)  Acute Rehab PT Goals Patient Stated Goal: get stronger and home, stop falling PT Goal Formulation: With patient Time For Goal Achievement: 08/03/22 Potential to Achieve Goals: Good    Frequency Min 3X/week     Co-evaluation PT/OT/SLP Co-Evaluation/Treatment: Yes Reason for Co-Treatment: For patient/therapist safety;To address functional/ADL transfers PT goals addressed during session: Mobility/safety with mobility;Balance;Proper use of DME OT goals addressed during session: ADL's and self-care       AM-PAC PT "6 Clicks" Mobility  Outcome Measure Help needed turning from your back to your side while in a flat bed without using bedrails?: A Little Help needed moving from lying on your back to sitting on the side of a flat bed  without using bedrails?: A Little Help needed moving to and from a bed to a chair (including a wheelchair)?: A Little Help needed standing up from a chair using your arms (e.g., wheelchair or bedside chair)?: A Little Help needed to walk in hospital room?: A Lot Help needed climbing 3-5 steps with a railing? : Total 6 Click Score: 15    End of Session Equipment Utilized During Treatment: Gait belt;Oxygen Activity Tolerance: Patient tolerated treatment well Patient left: in chair;with call bell/phone within  reach;with nursing/sitter in room Nurse Communication: Mobility status PT Visit Diagnosis: Unsteadiness on feet (R26.81);Muscle weakness (generalized) (M62.81);Other abnormalities of gait and mobility (R26.89);Difficulty in walking, not elsewhere classified (R26.2)    Time: 1110-1141 PT Time Calculation (min) (ACUTE ONLY): 31 min   Charges:   PT Evaluation $PT Eval Moderate Complexity: 1 Mod          Wynn Maudlin, DPT Acute Rehabilitation Services Office [phone]  07/20/22 11:58 AM

## 2022-07-20 NOTE — Progress Notes (Signed)
PROGRESS NOTE    Ariel [name]  WUJ:[id number] DOB: 12-Feb-1946 DOA: 07/19/2022 PCP: Macy Mis, MD   No chief complaint on file.   Brief Narrative:    Ariel [name]  is a 77 y.o. female, of HTN and hx of stroke with residual left sided weakness, HLD, GERD, CKD stage 3b, depression, atrial fibrillation on Eliquis who presented to Ed with cough, shortness of breath, and dark stools.   -Patient reports feeling sick for last 4 to 5 days, congestion, cough and shortness of breath, as well she reports diarrhea over last 24 hours, melena light, dark in color, she had dental procedure 10 days ago for which she held Eliquis for 2 days, took amoxicillin x 3 doses, reports over the weekend she started to feel sick, she saw her PCP yesterday who started her on doxycycline which she has not started to take any, reported at home today where she lives with her niece, she had a fall in the bathtub, she denies any head trauma, loss of consciousness, but report generalized weakness and fatigue, she fell at her buttocks, for which they had to call EMS -In ED her workup significant for dehydration, sodium of 130, chest x-ray significant for left lower lobe opacity, hemoglobin low at 7.9, down from 10.1 this March, colonoscopy last October at [facility] significant for polyps, patient reports she never had endoscopy before, she denies any NSAIDs use, she was Hemoccult positive  Assessment & Plan:   Principal Problem:   Pneumonia Active Problems:   CVA (cerebral vascular accident) (HCC)   Benign essential HTN   Anxiety and depression   GERD (gastroesophageal reflux disease)   Stage 3b chronic kidney disease (HCC)   Paroxysmal atrial fibrillation (HCC)   Normocytic anemia     Left lower lobe pneumonia Metapneumovirus pneumonia -patient presents with cough, congestion, x-ray significant for left lower lobe pneumonia - continue with IV Rocephin and azithromycin. -Follow on blood cultures. -As well  respiratory panel significant for metapneumovirus, continue with supportive care. -She was encouraged to use incentive spirometry and flutter valve   Microcytic anemia, GI blood loss anemia -Baseline hemoglobin 10-11, down to 7.9 mission, it is 7.4 today presents with melena -Continue to hold Eliquis - Colonoscopy 10/24/2021 significant for 3 subcentimeter polyp, and 110 mm polyp removed by cold snare's with 1 clip placed successfully. -GI input greatly appreciated, she may need endoscopy at 1 point, depending on respiratory status and improvement of her pneumonia, she is on clear liquid diet today, continue with IV Protonix and monitor CBC, keep active type and screen and transfuse as needed.  .   B12 deficiency -continue with supplements, will lower dose given elevated level.   Falls/deconditioning -The setting of acute illness, will consult PT/OT   Hyperlipedemia - Continue with home dose statin    Paroxysmal A-fib -Eliquis on hold due to above, -On Coreg for heart rate control   Hyponatremia - Due to volume depletion, improving with IV fluids   Hypertension  -Blood pressure soft, will keep on as needed hydralazine and hold home medications, including losartan and amlodipine, continue with Coreg due to A-fib   GERD  -She will be on IV PPI    Anxiety/depression  -Continue with home medications    Recurrent CVAs -Resume Eliquis once cleared by GI   CKD stage IIIb  -Renal function and at baseline, continue to monitor closely, avoid nephrotoxic medications      DVT prophylaxis:scd Code Status: Full Family Communication: none at bedside  Disposition:   Status is: Inpatient    Consultants:  GI   Subjective:  She is reporting generalized weakness, fatigue, cough, she reports BM 1 time overnight.  Objective: Vitals:   07/19/22 2331 07/20/22 0319 07/20/22 0744 07/20/22 1147  BP: (!) 118/44 (!) 128/49 (!) 131/45 (!) 114/50  Pulse: 78 73    Resp: 18 (!) 25     Temp: 98.6 F (37 C) 98.5 F (36.9 C) 98.9 F (37.2 C) 98.4 F (36.9 C)  TempSrc: Oral Oral Oral Oral  SpO2: 97% 94%      Intake/Output Summary (Last 24 hours) at 07/20/2022 1247 Last data filed at 07/20/2022 1100 Gross per 24 hour  Intake 963.33 ml  Output 200 ml  Net 763.33 ml   There were no vitals filed for this visit.  Examination:  Awake Alert, Oriented X 3, No new F.N deficits, Normal affect Symmetrical Chest wall movement, Good air movement bilaterally, CTAB RRR,No Gallops,Rubs or new Murmurs, No Parasternal Heave +ve B.Sounds, Abd Soft, No tenderness, No rebound - guarding or rigidity. No Cyanosis, Clubbing or edema, No new Rash or bruise       Data Reviewed: I have personally reviewed following labs and imaging studies  CBC: Recent Labs  Lab 07/19/22 1410 07/19/22 2142 07/20/22 0349  WBC 6.5 5.1 4.4  NEUTROABS 4.9  --   --   HGB 7.9* 7.4* 7.4*  HCT 26.6* 24.2* 24.1*  MCV 78.5* 77.3* 79.0*  PLT 132* 142* 131*    Basic Metabolic Panel: Recent Labs  Lab 07/19/22 1410 07/20/22 0349  NA 130* 131*  K 4.4 3.5  CL 101 101  CO2 18* 20*  GLUCOSE 124* 103*  BUN 37* 26*  CREATININE 1.69* 1.34*  CALCIUM 8.2* 7.5*    GFR: CrCl cannot be calculated (Unknown ideal weight.).  Liver Function Tests: Recent Labs  Lab 07/19/22 1410  AST 65*  ALT 25  ALKPHOS 33*  BILITOT 0.6  PROT 6.3*  ALBUMIN 3.2*    CBG: No results for input(s): "GLUCAP" in the last 168 hours.   Recent Results (from the past 240 hour(s))  Culture, blood (routine x 2)     Status: None (Preliminary result)   Collection Time: 07/19/22  2:21 PM   Specimen: BLOOD  Result Value Ref Range Status   Specimen Description BLOOD RIGHT ANTECUBITAL  Final   Special Requests   Final    BOTTLES DRAWN AEROBIC AND ANAEROBIC Blood Culture adequate volume   Culture   Final    NO GROWTH < 24 HOURS Performed at [facility], [address] [place], [place] [address]    Report Status  PENDING  Incomplete  SARS Coronavirus 2 by RT PCR (hospital order, performed in [facility] hospital lab) *cepheid single result test*     Status: None   Collection Time: 07/19/22  2:21 PM   Specimen: Nasal Swab  Result Value Ref Range Status   SARS Coronavirus 2 by RT PCR NEGATIVE NEGATIVE Final    Comment: Performed at [facility], [address] [place], [place] [address]  Culture, blood (routine x 2)     Status: None (Preliminary result)   Collection Time: 07/19/22  2:26 PM   Specimen: BLOOD LEFT FOREARM  Result Value Ref Range Status   Specimen Description BLOOD LEFT FOREARM  Final   Special Requests   Final    BOTTLES DRAWN AEROBIC ONLY Blood Culture results may not be optimal due to an inadequate volume  of blood received in culture bottles   Culture   Final    NO GROWTH < 24 HOURS Performed at [facility], [address], [place], [place] [address]    Report Status PENDING  Incomplete  Respiratory (~20 pathogens) panel by PCR     Status: Abnormal   Collection Time: 07/19/22  4:34 PM   Specimen: Nasopharyngeal Swab; Respiratory  Result Value Ref Range Status   Adenovirus NOT DETECTED NOT DETECTED Final   Coronavirus 229E NOT DETECTED NOT DETECTED Final    Comment: (NOTE) The Coronavirus on the Respiratory Panel, DOES NOT test for the novel  Coronavirus (2019 nCoV)    Coronavirus HKU1 NOT DETECTED NOT DETECTED Final   Coronavirus NL63 NOT DETECTED NOT DETECTED Final   Coronavirus OC43 NOT DETECTED NOT DETECTED Final   Metapneumovirus DETECTED (A) NOT DETECTED Final   Rhinovirus / Enterovirus NOT DETECTED NOT DETECTED Final   Influenza A NOT DETECTED NOT DETECTED Final   Influenza B NOT DETECTED NOT DETECTED Final   Parainfluenza Virus 1 NOT DETECTED NOT DETECTED Final   Parainfluenza Virus 2 NOT DETECTED NOT DETECTED Final   Parainfluenza Virus 3 NOT DETECTED NOT DETECTED Final   Parainfluenza Virus 4 NOT DETECTED NOT DETECTED Final   Respiratory Syncytial  Virus NOT DETECTED NOT DETECTED Final   Bordetella pertussis NOT DETECTED NOT DETECTED Final   Bordetella Parapertussis NOT DETECTED NOT DETECTED Final   Chlamydophila pneumoniae NOT DETECTED NOT DETECTED Final   Mycoplasma pneumoniae NOT DETECTED NOT DETECTED Final    Comment: Performed at [facility], [address] [place], [place] [address]         Radiology Studies: DG Chest Portable 1 View  Result Date: 07/19/2022 CLINICAL DATA:  Shortness of breath, cough EXAM: PORTABLE CHEST 1 VIEW COMPARISON:  03/31/2018 FINDINGS: Transverse diameter of heart is slightly increased. There are no signs of pulmonary edema. Patchy infiltrates are seen in the left lower lung field. There is no significant pleural effusion or pneumothorax. There is implantable cardiac monitoring device in left chest wall. IMPRESSION: Patchy infiltrate in left lower lung field suggesting atelectasis/pneumonia. Electronically Signed   By: Ernie Avena M.D.   On: 07/19/2022 15:24        Scheduled Meds:  atorvastatin  80 mg Oral QPM   azithromycin  500 mg Oral Daily   buPROPion  300 mg Oral Daily   carvedilol  3.125 mg Oral BID   cyanocobalamin  1,000 mcg Oral Daily   gabapentin  100 mg Oral BID   pantoprazole (PROTONIX) IV  40 mg Intravenous Q12H   Continuous Infusions:  sodium chloride 50 mL/hr at 07/19/22 1841   cefTRIAXone (ROCEPHIN)  IV       LOS: 1 day        Huey Bienenstock, MD [facility]   To contact the attending provider between 7A-7P or the covering provider during after hours 7P-7A, please log into the web site [email or web address] and access using universal [place] password for that web site. If you do not have the password, please call the hospital operator.  07/20/2022, 12:47 PM

## 2022-07-21 DIAGNOSIS — Z7901 Long term (current) use of anticoagulants: Secondary | ICD-10-CM | POA: Diagnosis not present

## 2022-07-21 DIAGNOSIS — I4891 Unspecified atrial fibrillation: Secondary | ICD-10-CM | POA: Diagnosis not present

## 2022-07-21 DIAGNOSIS — D62 Acute posthemorrhagic anemia: Secondary | ICD-10-CM | POA: Diagnosis not present

## 2022-07-21 DIAGNOSIS — R197 Diarrhea, unspecified: Secondary | ICD-10-CM

## 2022-07-21 DIAGNOSIS — K922 Gastrointestinal hemorrhage, unspecified: Secondary | ICD-10-CM | POA: Diagnosis not present

## 2022-07-21 DIAGNOSIS — J189 Pneumonia, unspecified organism: Secondary | ICD-10-CM | POA: Diagnosis not present

## 2022-07-21 DIAGNOSIS — K921 Melena: Secondary | ICD-10-CM

## 2022-07-21 LAB — CBC WITH DIFFERENTIAL/PLATELET
Abs Immature Granulocytes: 0.01 10*3/uL (ref 0.00–0.07)
Basophils Absolute: 0 10*3/uL (ref 0.0–0.1)
Basophils Relative: 0 %
Eosinophils Absolute: 0 10*3/uL (ref 0.0–0.5)
Eosinophils Relative: 0 %
HCT: 24.4 % — ABNORMAL LOW (ref 36.0–46.0)
Hemoglobin: 7.5 g/dL — ABNORMAL LOW (ref 12.0–15.0)
Immature Granulocytes: 0 %
Lymphocytes Relative: 38 %
Lymphs Abs: 1.3 10*3/uL (ref 0.7–4.0)
MCH: 24 pg — ABNORMAL LOW (ref 26.0–34.0)
MCHC: 30.7 g/dL (ref 30.0–36.0)
MCV: 78.2 fL — ABNORMAL LOW (ref 80.0–100.0)
Monocytes Absolute: 0.1 10*3/uL (ref 0.1–1.0)
Monocytes Relative: 4 %
Neutro Abs: 1.9 10*3/uL (ref 1.7–7.7)
Neutrophils Relative %: 58 %
Platelets: 127 10*3/uL — ABNORMAL LOW (ref 150–400)
RBC: 3.12 MIL/uL — ABNORMAL LOW (ref 3.87–5.11)
RDW: 17.5 % — ABNORMAL HIGH (ref 11.5–15.5)
WBC: 3.3 10*3/uL — ABNORMAL LOW (ref 4.0–10.5)
nRBC: 0 % (ref 0.0–0.2)

## 2022-07-21 LAB — BASIC METABOLIC PANEL
Anion gap: 9 (ref 5–15)
BUN: 13 mg/dL (ref 8–23)
CO2: 20 mmol/L — ABNORMAL LOW (ref 22–32)
Calcium: 7.5 mg/dL — ABNORMAL LOW (ref 8.9–10.3)
Chloride: 107 mmol/L (ref 98–111)
Creatinine, Ser: 1.09 mg/dL — ABNORMAL HIGH (ref 0.44–1.00)
GFR, Estimated: 53 mL/min — ABNORMAL LOW (ref >60)
Glucose, Bld: 105 mg/dL — ABNORMAL HIGH (ref 70–99)
Potassium: 3.6 mmol/L (ref 3.5–5.1)
Sodium: 136 mmol/L (ref 135–145)

## 2022-07-21 LAB — URINE CULTURE: Culture: NO GROWTH

## 2022-07-21 LAB — PHOSPHORUS: Phosphorus: 2.7 mg/dL (ref 2.5–4.6)

## 2022-07-21 LAB — MAGNESIUM: Magnesium: 1.9 mg/dL (ref 1.7–2.4)

## 2022-07-21 LAB — CULTURE, BLOOD (ROUTINE X 2)

## 2022-07-21 MED ORDER — PANTOPRAZOLE SODIUM 40 MG PO TBEC
40.0000 mg | DELAYED_RELEASE_TABLET | Freq: Two times a day (BID) | ORAL | Status: DC
  Administered 2022-07-21 – 2022-07-29 (×16): 40 mg via ORAL
  Filled 2022-07-21 (×15): qty 1

## 2022-07-21 NOTE — Progress Notes (Signed)
PROGRESS NOTE    Ariel [name]  MVH:[id number] DOB: 12-05-1945 DOA: 07/19/2022 PCP: Macy Mis, MD   No chief complaint on file.   Brief Narrative:    Ariel [name]  is a 77 y.o. female, of HTN and hx of stroke with residual left sided weakness, HLD, GERD, CKD stage 3b, depression, atrial fibrillation on Eliquis who presented to Ed with cough, shortness of breath, and dark stools.   -Patient reports feeling sick for last 4 to 5 days, congestion, cough and shortness of breath, as well she reports diarrhea over last 24 hours, melena light, dark in color, she had dental procedure 10 days ago for which she held Eliquis for 2 days, took amoxicillin x 3 doses, reports over the weekend she started to feel sick, she saw her PCP yesterday who started her on doxycycline which she has not started to take any, reported at home today where she lives with her niece, she had a fall in the bathtub, she denies any head trauma, loss of consciousness, but report generalized weakness and fatigue, she fell at her buttocks, for which they had to call EMS -In ED her workup significant for dehydration, sodium of 130, chest x-ray significant for left lower lobe opacity, hemoglobin low at 7.9, down from 10.1 this March, colonoscopy last October at [facility] significant for polyps, patient reports she never had endoscopy before, she denies any NSAIDs use, she was Hemoccult positive  Assessment & Plan:   Principal Problem:   Pneumonia Active Problems:   CVA (cerebral vascular accident) (HCC)   Benign essential HTN   Anxiety and depression   GERD (gastroesophageal reflux disease)   Stage 3b chronic kidney disease (HCC)   Paroxysmal atrial fibrillation (HCC)   Normocytic anemia     Left lower lobe pneumonia Metapneumovirus pneumonia -patient presents with cough, congestion, x-ray significant for left lower lobe pneumonia - continue with IV Rocephin and azithromycin. -Follow on blood cultures.  So far remains  negative -As well respiratory panel significant for metapneumovirus, continue with supportive care. -She was encouraged to use incentive spirometry and flutter valve   Microcytic anemia, GI blood loss anemia -Baseline hemoglobin 10-11, down to 7.9 mission, it is 7.4 today presents with melena -Continue to hold Eliquis - Colonoscopy 10/24/2021 significant for 3 subcentimeter polyp, and 110 mm polyp removed by cold snare's with 1 clip placed successfully. -GI input greatly appreciated, she may need endoscopy at 1 point, depending on respiratory status and improvement of her pneumonia, she is on clear liquid diet today, continue with IV Protonix and monitor CBC, keep active type and screen and transfuse as needed.  . -Globin remained stable, she still reporting watery diarrhea, dark in color, will need EGD at 1 point when medically stable and her cough improves.   B12 deficiency -continue with supplements, will lower dose given elevated level.   Falls/deconditioning -The setting of acute illness, will consult PT/OT   Hyperlipedemia - Continue with home dose statin    Paroxysmal A-fib -Eliquis on hold due to above, -On Coreg for heart rate control   Hyponatremia - Due to volume depletion, improving with IV fluids   Hypertension  -Blood pressure soft, will keep on as needed hydralazine and hold home medications, including losartan and amlodipine, continue with Coreg due to A-fib   GERD  -She will be on IV PPI    Anxiety/depression  -Continue with home medications    Recurrent CVAs -Resume Eliquis once cleared by GI   CKD stage  IIIb  -Renal function and at baseline, continue to monitor closely, avoid nephrotoxic medications      DVT prophylaxis:scd Code Status: Full Family Communication: none at bedside Disposition:   Status is: Inpatient    Consultants:  GI   Subjective: Still reports generalized weakness, still having nagging and significant cough, she reports 1  bowel movement overnight, watery, dark in color. Objective: Vitals:   07/21/22 0000 07/21/22 0338 07/21/22 0400 07/21/22 0830  BP: (!) 126/48 (!) 129/47 (!) 117/45 121/72  Pulse: 75 73 68   Resp:  20 20   Temp:   98 F (36.7 C)   TempSrc:      SpO2:  91% 94%     Intake/Output Summary (Last 24 hours) at 07/21/2022 1326 Last data filed at 07/21/2022 0641 Gross per 24 hour  Intake 956.67 ml  Output 350 ml  Net 606.67 ml   There were no vitals filed for this visit.  Examination:  Awake Alert, Oriented X 3, frail, deconditioned Symmetrical Chest wall movement, Good air movement bilaterally, CTAB RRR,No Gallops,Rubs or new Murmurs, No Parasternal Heave +ve B.Sounds, Abd Soft, No tenderness, No rebound - guarding or rigidity. No Cyanosis, Clubbing or edema, No new Rash or bruise        Data Reviewed: I have personally reviewed following labs and imaging studies  CBC: Recent Labs  Lab 07/19/22 1410 07/19/22 2142 07/20/22 0349 07/20/22 1759 07/21/22 0556  WBC 6.5 5.1 4.4  --  3.3*  NEUTROABS 4.9  --   --   --  1.9  HGB 7.9* 7.4* 7.4* 7.4* 7.5*  HCT 26.6* 24.2* 24.1*  --  24.4*  MCV 78.5* 77.3* 79.0*  --  78.2*  PLT 132* 142* 131*  --  127*    Basic Metabolic Panel: Recent Labs  Lab 07/19/22 1410 07/20/22 0349 07/21/22 0556  NA 130* 131* 136  K 4.4 3.5 3.6  CL 101 101 107  CO2 18* 20* 20*  GLUCOSE 124* 103* 105*  BUN 37* 26* 13  CREATININE 1.69* 1.34* 1.09*  CALCIUM 8.2* 7.5* 7.5*  MG  --   --  1.9  PHOS  --   --  2.7    GFR: CrCl cannot be calculated (Unknown ideal weight.).  Liver Function Tests: Recent Labs  Lab 07/19/22 1410  AST 65*  ALT 25  ALKPHOS 33*  BILITOT 0.6  PROT 6.3*  ALBUMIN 3.2*    CBG: No results for input(s): "GLUCAP" in the last 168 hours.   Recent Results (from the past 240 hour(s))  Culture, blood (routine x 2)     Status: None (Preliminary result)   Collection Time: 07/19/22  2:21 PM   Specimen: BLOOD  Result  Value Ref Range Status   Specimen Description BLOOD RIGHT ANTECUBITAL  Final   Special Requests   Final    BOTTLES DRAWN AEROBIC AND ANAEROBIC Blood Culture adequate volume   Culture   Final    NO GROWTH 2 DAYS Performed at [facility], [address], [place], [place] [address]    Report Status PENDING  Incomplete  Urine Culture     Status: None   Collection Time: 07/19/22  2:21 PM   Specimen: Urine, Clean Catch  Result Value Ref Range Status   Specimen Description URINE, CLEAN CATCH  Final   Special Requests NONE  Final   Culture   Final    NO GROWTH Performed at [facility], [address] [place], [place]  [address]    Report Status 07/21/2022 FINAL  Final  SARS Coronavirus 2 by RT PCR (hospital order, performed in [facility] hospital lab) *cepheid single result test*     Status: None   Collection Time: 07/19/22  2:21 PM   Specimen: Nasal Swab  Result Value Ref Range Status   SARS Coronavirus 2 by RT PCR NEGATIVE NEGATIVE Final    Comment: Performed at [facility], [address] [place], [place] [address]  Culture, blood (routine x 2)     Status: None (Preliminary result)   Collection Time: 07/19/22  2:26 PM   Specimen: BLOOD LEFT FOREARM  Result Value Ref Range Status   Specimen Description BLOOD LEFT FOREARM  Final   Special Requests   Final    BOTTLES DRAWN AEROBIC ONLY Blood Culture results may not be optimal due to an inadequate volume of blood received in culture bottles   Culture   Final    NO GROWTH 2 DAYS Performed at [facility], [address], [place], [place] [address]    Report Status PENDING  Incomplete  Respiratory (~20 pathogens) panel by PCR     Status: Abnormal   Collection Time: 07/19/22  4:34 PM   Specimen: Nasopharyngeal Swab; Respiratory  Result Value Ref Range Status   Adenovirus NOT DETECTED NOT DETECTED Final   Coronavirus 229E NOT DETECTED NOT DETECTED Final    Comment: (NOTE) The Coronavirus on the Respiratory  Panel, DOES NOT test for the novel  Coronavirus (2019 nCoV)    Coronavirus HKU1 NOT DETECTED NOT DETECTED Final   Coronavirus NL63 NOT DETECTED NOT DETECTED Final   Coronavirus OC43 NOT DETECTED NOT DETECTED Final   Metapneumovirus DETECTED (A) NOT DETECTED Final   Rhinovirus / Enterovirus NOT DETECTED NOT DETECTED Final   Influenza A NOT DETECTED NOT DETECTED Final   Influenza B NOT DETECTED NOT DETECTED Final   Parainfluenza Virus 1 NOT DETECTED NOT DETECTED Final   Parainfluenza Virus 2 NOT DETECTED NOT DETECTED Final   Parainfluenza Virus 3 NOT DETECTED NOT DETECTED Final   Parainfluenza Virus 4 NOT DETECTED NOT DETECTED Final   Respiratory Syncytial Virus NOT DETECTED NOT DETECTED Final   Bordetella pertussis NOT DETECTED NOT DETECTED Final   Bordetella Parapertussis NOT DETECTED NOT DETECTED Final   Chlamydophila pneumoniae NOT DETECTED NOT DETECTED Final   Mycoplasma pneumoniae NOT DETECTED NOT DETECTED Final    Comment: Performed at [facility], [address] [place], [place] [address]         Radiology Studies: DG Chest Portable 1 View  Result Date: 07/19/2022 CLINICAL DATA:  Shortness of breath, cough EXAM: PORTABLE CHEST 1 VIEW COMPARISON:  03/31/2018 FINDINGS: Transverse diameter of heart is slightly increased. There are no signs of pulmonary edema. Patchy infiltrates are seen in the left lower lung field. There is no significant pleural effusion or pneumothorax. There is implantable cardiac monitoring device in left chest wall. IMPRESSION: Patchy infiltrate in left lower lung field suggesting atelectasis/pneumonia. Electronically Signed   By: Ernie Avena M.D.   On: 07/19/2022 15:24        Scheduled Meds:  atorvastatin  80 mg Oral QPM   azithromycin  500 mg Oral Daily   buPROPion  300 mg Oral Daily   carvedilol  3.125 mg Oral BID   cyanocobalamin  500 mcg Oral Daily   gabapentin  100 mg Oral BID   pantoprazole (PROTONIX) IV  40 mg Intravenous  Q12H  Continuous Infusions:  sodium chloride 50 mL/hr at 07/19/22 1841   cefTRIAXone (ROCEPHIN)  IV 2 g (07/20/22 1530)     LOS: 2 days        Huey Bienenstock, MD [facility]   To contact the attending provider between 7A-7P or the covering provider during after hours 7P-7A, please log into the web site [email or web address] and access using universal [place] password for that web site. If you do not have the password, please call the hospital operator.  07/21/2022, 1:26 PM

## 2022-07-21 NOTE — Progress Notes (Addendum)
[facility] Progress Note  CC:  Black stool/heme positive   Subjective:  Still with nagging cough.  Had two watery stools this AM, says they are still dark but Hgb is holding stable.  No abdominal pain.  Objective:  Vital signs in last 24 hours: Temp:  [98 F (36.7 C)-100.7 F (38.2 C)] 98 F (36.7 C) (05/17 0400) Pulse Rate:  [68-75] 68 (05/17 0400) Resp:  [20-22] 20 (05/17 0400) BP: (110-143)/(45-120) 121/72 (05/17 0830) SpO2:  [91 %-98 %] 94 % (05/17 0400) Last BM Date : 07/20/22 General:  Alert, Well-developed,  in NAD Heart:  Regular rate and rhythm; no murmurs Pulm:  CTAB anteriorly. Abdomen:  Soft, non-distended.  BS present.  Non-tender. Extremities:  Without edema. Neurologic:  Alert and oriented x 4;  grossly normal neurologically. Psych:  Alert and cooperative. Normal mood and affect.  Intake/Output from previous day: 05/16 0701 - 05/17 0700 In: 1376.7 [P.O.:820; I.V.:456.7; IV Piggyback:100] Out: 350 [Urine:350]  Lab Results: Recent Labs    07/19/22 2142 07/20/22 0349 07/20/22 1759 07/21/22 0556  WBC 5.1 4.4  --  3.3*  HGB 7.4* 7.4* 7.4* 7.5*  HCT 24.2* 24.1*  --  24.4*  PLT 142* 131*  --  127*   BMET Recent Labs    07/19/22 1410 07/20/22 0349 07/21/22 0556  NA 130* 131* 136  K 4.4 3.5 3.6  CL 101 101 107  CO2 18* 20* 20*  GLUCOSE 124* 103* 105*  BUN 37* 26* 13  CREATININE 1.69* 1.34* 1.09*  CALCIUM 8.2* 7.5* 7.5*   LFT Recent Labs    07/19/22 1410  PROT 6.3*  ALBUMIN 3.2*  AST 65*  ALT 25  ALKPHOS 33*  BILITOT 0.6   PT/INR Recent Labs    07/19/22 1410  LABPROT 17.9*  INR 1.5*   DG Chest Portable 1 View  Result Date: 07/19/2022 CLINICAL DATA:  Shortness of breath, cough EXAM: PORTABLE CHEST 1 VIEW COMPARISON:  03/31/2018 FINDINGS: Transverse diameter of heart is slightly increased. There are no signs of pulmonary edema. Patchy infiltrates are seen in the left lower lung field. There is no significant pleural  effusion or pneumothorax. There is implantable cardiac monitoring device in left chest wall. IMPRESSION: Patchy infiltrate in left lower lung field suggesting atelectasis/pneumonia. Electronically Signed   By: Ernie Avena M.D.   On: 07/19/2022 15:24    Assessment / Plan: *Dark/black stool, heme positive:  On pantoprazole 40 mg IV BID here. *Acute on chronic anemia:  Hgb down about 2 grams from a couple of months ago.  It is stable this AM at 7.5 grams, has been stable the past several reads. *Cough/left lower lobe pneumonia:  Tested positive for Metapnuemovirus on respiratory panel.  On 2L O2 here, none at home, but does not look in distress. *Atrial fibrillation on Eliquis:  Eliquis is on hold.   *GERD:  Reports frequent reflux at home despite pantoprazole 40 mg daily.  No history of EGD to her knowledge. *Hyponatremia:  Resolved.  -Will order Cdiff again as she reports two watery stools again this AM. -Will need EGD prior to discharge so that we can assess for high risk bleeding since she will need to resume her Eliquis.  This needs to wait until cough improves as if can make EGD technically difficult. -Trend Hgb. -Continue pantoprazole 40 mg BID, -? If she can have full liquids today.    LOS: 2 days   Princella Pellegrini. Zehr  07/21/2022, 9:37 AM  I have taken an interval history, thoroughly reviewed the chart and examined the patient. I agree with the Advanced Practitioner's note, impression and recommendations, and have recorded additional findings, impressions and recommendations below. I performed a substantive portion of this encounter (>50% time spent), including a complete performance of the medical decision making.  My additional thoughts are as follows:   Persistent cough, dark stools but not reportedly melena.  Hemoglobin stable, no hematemesis or vomiting.  Holding off EGD until later this admission for hopeful improvement in cough and respiratory status.  I have ordered a  low residue diet.  CBC tomorrow morning  Charlie Pitter III Office:[phone]

## 2022-07-21 NOTE — Care Management Important Message (Signed)
Important Message  Patient Details  Name: Ariel [name] MRN: [id number] Date of Birth: 18-Sep-1945   Medicare Important Message Given:  Yes     Sherilyn Banker 07/21/2022, 4:04 PM

## 2022-07-22 DIAGNOSIS — Z7901 Long term (current) use of anticoagulants: Secondary | ICD-10-CM | POA: Diagnosis not present

## 2022-07-22 DIAGNOSIS — I4891 Unspecified atrial fibrillation: Secondary | ICD-10-CM | POA: Diagnosis not present

## 2022-07-22 DIAGNOSIS — K921 Melena: Secondary | ICD-10-CM | POA: Diagnosis not present

## 2022-07-22 DIAGNOSIS — J189 Pneumonia, unspecified organism: Secondary | ICD-10-CM | POA: Diagnosis not present

## 2022-07-22 DIAGNOSIS — D62 Acute posthemorrhagic anemia: Secondary | ICD-10-CM | POA: Diagnosis not present

## 2022-07-22 LAB — CBC
HCT: 24 % — ABNORMAL LOW (ref 36.0–46.0)
Hemoglobin: 7.4 g/dL — ABNORMAL LOW (ref 12.0–15.0)
MCH: 24.1 pg — ABNORMAL LOW (ref 26.0–34.0)
MCHC: 30.8 g/dL (ref 30.0–36.0)
MCV: 78.2 fL — ABNORMAL LOW (ref 80.0–100.0)
Platelets: 136 10*3/uL — ABNORMAL LOW (ref 150–400)
RBC: 3.07 MIL/uL — ABNORMAL LOW (ref 3.87–5.11)
RDW: 17.5 % — ABNORMAL HIGH (ref 11.5–15.5)
WBC: 3.2 10*3/uL — ABNORMAL LOW (ref 4.0–10.5)
nRBC: 0 % (ref 0.0–0.2)

## 2022-07-22 LAB — CULTURE, BLOOD (ROUTINE X 2)

## 2022-07-22 LAB — C DIFFICILE QUICK SCREEN W PCR REFLEX
C Diff antigen: NEGATIVE
C Diff interpretation: NOT DETECTED
C Diff toxin: NEGATIVE

## 2022-07-22 MED ORDER — BENZONATATE 100 MG PO CAPS
200.0000 mg | ORAL_CAPSULE | Freq: Once | ORAL | Status: AC
  Administered 2022-07-22: 200 mg via ORAL
  Filled 2022-07-22: qty 2

## 2022-07-22 MED ORDER — HYDROCOD POLI-CHLORPHE POLI ER 10-8 MG/5ML PO SUER
5.0000 mL | Freq: Two times a day (BID) | ORAL | Status: DC
  Administered 2022-07-22 – 2022-07-29 (×14): 5 mL via ORAL
  Filled 2022-07-22 (×14): qty 5

## 2022-07-22 NOTE — Progress Notes (Signed)
PROGRESS NOTE    REBECKAH [name]  ZOX:[id number] DOB: [birth year] DOA: 07/19/2022 PCP: Macy Mis, MD   No chief complaint on file.   Brief Narrative:    Ariel [name]  is a 77 y.o. female, of HTN and hx of stroke with residual left sided weakness, HLD, GERD, CKD stage 3b, depression, atrial fibrillation on Eliquis who presented to Ed with cough, shortness of breath, and dark stools.   -Patient reports feeling sick for last 4 to 5 days, congestion, cough and shortness of breath, as well she reports diarrhea over last 24 hours, melena light, dark in color, she had dental procedure 10 days ago for which she held Eliquis for 2 days, took amoxicillin x 3 doses, reports over the weekend she started to feel sick, she saw her PCP yesterday who started her on doxycycline which she has not started to take any, reported at home today where she lives with her niece, she had a fall in the bathtub, she denies any head trauma, loss of consciousness, but report generalized weakness and fatigue, she fell at her buttocks, for which they had to call EMS -In ED her workup significant for dehydration, sodium of 130, chest x-ray significant for left lower lobe opacity, hemoglobin low at 7.9, down from 10.1 this March, colonoscopy last October at [facility] significant for polyps, patient reports she never had endoscopy before, she denies any NSAIDs use, she was Hemoccult positive  Assessment & Plan:   Principal Problem:   Pneumonia Active Problems:   CVA (cerebral vascular accident) (HCC)   Benign essential HTN   Anxiety and depression   GERD (gastroesophageal reflux disease)   Stage 3b chronic kidney disease (HCC)   Paroxysmal atrial fibrillation (HCC)   Normocytic anemia   Melena   Acute diarrhea   Acute blood loss anemia     Left lower lobe pneumonia Metapneumovirus pneumonia -patient presents with cough, congestion, x-ray significant for left lower lobe pneumonia - continue with IV Rocephin and  azithromycin. -Follow on blood cultures.  So far remains negative -As well respiratory panel significant for metapneumovirus, continue with supportive care. -She was encouraged to use incentive spirometry and flutter valve -Still reports nagging cough, plan for endoscopy on Monday, will start on Tussionex as needed   Microcytic anemia, GI blood loss anemia -Baseline hemoglobin 10-11, down to 7.9 mission, it is 7.4 today presents with melena -Continue to hold Eliquis - Colonoscopy 10/24/2021 significant for 3 subcentimeter polyp, and 110 mm polyp removed by cold snare's with 1 clip placed successfully. -GI input greatly appreciated, she may need endoscopy at 1 point, depending on respiratory status and improvement of her pneumonia, she is on clear liquid diet today, continue with IV Protonix and monitor CBC, keep active type and screen and transfuse as needed.  . -Globin remained stable, she still reporting watery diarrhea, dark in color, will need EGD at 1 point when medically stable and her cough improves.  Hopefully by Monday.   B12 deficiency -continue with supplements, will lower dose given elevated level.   Falls/deconditioning -The setting of acute illness, will consult PT/OT   Hyperlipedemia - Continue with home dose statin    Paroxysmal A-fib -Eliquis on hold due to above, -On Coreg for heart rate control   Hyponatremia - Due to volume depletion, improving with IV fluids   Hypertension  -Blood pressure soft, will keep on as needed hydralazine and hold home medications, including losartan and amlodipine, continue with Coreg due to A-fib  GERD  -She will be on IV PPI    Anxiety/depression  -Continue with home medications    Recurrent CVAs -Resume Eliquis once cleared by GI   CKD stage IIIb  -Renal function and at baseline, continue to monitor closely, avoid nephrotoxic medications      DVT prophylaxis:scd Code Status: Full Family Communication: none at  bedside Disposition:   Status is: Inpatient    Consultants:  GI   Subjective: Reporting some cough, but this much improved, still having diarrhea, but no melena. Objective: Vitals:   07/22/22 0000 07/22/22 0019 07/22/22 0340 07/22/22 0400  BP: (!) 143/55   125/84  Pulse: 71 74  72  Resp: 20   (!) 22  Temp: 98.1 F (36.7 C)  97.8 F (36.6 C)   TempSrc: Oral  Oral   SpO2:  95%  93%    Intake/Output Summary (Last 24 hours) at 07/22/2022 1348 Last data filed at 07/22/2022 1049 Gross per 24 hour  Intake --  Output 1300 ml  Net -1300 ml   There were no vitals filed for this visit.  Examination:  Awake Alert, Oriented X 3, frail, deconditioned Symmetrical Chest wall movement, Good air movement bilaterally, CTAB RRR,No Gallops,Rubs or new Murmurs, No Parasternal Heave +ve B.Sounds, Abd Soft, No tenderness, No rebound - guarding or rigidity. No Cyanosis, Clubbing or edema, No new Rash or bruise         Data Reviewed: I have personally reviewed following labs and imaging studies  CBC: Recent Labs  Lab 07/19/22 1410 07/19/22 2142 07/20/22 0349 07/20/22 1759 07/21/22 0556 07/22/22 0335  WBC 6.5 5.1 4.4  --  3.3* 3.2*  NEUTROABS 4.9  --   --   --  1.9  --   HGB 7.9* 7.4* 7.4* 7.4* 7.5* 7.4*  HCT 26.6* 24.2* 24.1*  --  24.4* 24.0*  MCV 78.5* 77.3* 79.0*  --  78.2* 78.2*  PLT 132* 142* 131*  --  127* 136*    Basic Metabolic Panel: Recent Labs  Lab 07/19/22 1410 07/20/22 0349 07/21/22 0556  NA 130* 131* 136  K 4.4 3.5 3.6  CL 101 101 107  CO2 18* 20* 20*  GLUCOSE 124* 103* 105*  BUN 37* 26* 13  CREATININE 1.69* 1.34* 1.09*  CALCIUM 8.2* 7.5* 7.5*  MG  --   --  1.9  PHOS  --   --  2.7    GFR: CrCl cannot be calculated (Unknown ideal weight.).  Liver Function Tests: Recent Labs  Lab 07/19/22 1410  AST 65*  ALT 25  ALKPHOS 33*  BILITOT 0.6  PROT 6.3*  ALBUMIN 3.2*    CBG: No results for input(s): "GLUCAP" in the last 168  hours.   Recent Results (from the past 240 hour(s))  Culture, blood (routine x 2)     Status: None (Preliminary result)   Collection Time: 07/19/22  2:21 PM   Specimen: BLOOD  Result Value Ref Range Status   Specimen Description BLOOD RIGHT ANTECUBITAL  Final   Special Requests   Final    BOTTLES DRAWN AEROBIC AND ANAEROBIC Blood Culture adequate volume   Culture   Final    NO GROWTH 3 DAYS Performed at [facility], [address] [place], [place] [address]    Report Status PENDING  Incomplete  Urine Culture     Status: None   Collection Time: 07/19/22  2:21 PM   Specimen: Urine, Clean Catch  Result Value Ref Range Status  Specimen Description URINE, CLEAN CATCH  Final   Special Requests NONE  Final   Culture   Final    NO GROWTH Performed at [facility], [address] [place], [place] [address]    Report Status 07/21/2022 FINAL  Final  SARS Coronavirus 2 by RT PCR (hospital order, performed in [facility] hospital lab) *cepheid single result test*     Status: None   Collection Time: 07/19/22  2:21 PM   Specimen: Nasal Swab  Result Value Ref Range Status   SARS Coronavirus 2 by RT PCR NEGATIVE NEGATIVE Final    Comment: Performed at [facility], [address], [place], [place] [address]  Culture, blood (routine x 2)     Status: None (Preliminary result)   Collection Time: 07/19/22  2:26 PM   Specimen: BLOOD LEFT FOREARM  Result Value Ref Range Status   Specimen Description BLOOD LEFT FOREARM  Final   Special Requests   Final    BOTTLES DRAWN AEROBIC ONLY Blood Culture results may not be optimal due to an inadequate volume of blood received in culture bottles   Culture   Final    NO GROWTH 3 DAYS Performed at [facility], [address], [place], [place] [address]    Report Status PENDING  Incomplete  Respiratory (~20 pathogens) panel by PCR     Status: Abnormal   Collection Time: 07/19/22  4:34 PM   Specimen: Nasopharyngeal Swab;  Respiratory  Result Value Ref Range Status   Adenovirus NOT DETECTED NOT DETECTED Final   Coronavirus 229E NOT DETECTED NOT DETECTED Final    Comment: (NOTE) The Coronavirus on the Respiratory Panel, DOES NOT test for the novel  Coronavirus (2019 nCoV)    Coronavirus HKU1 NOT DETECTED NOT DETECTED Final   Coronavirus NL63 NOT DETECTED NOT DETECTED Final   Coronavirus OC43 NOT DETECTED NOT DETECTED Final   Metapneumovirus DETECTED (A) NOT DETECTED Final   Rhinovirus / Enterovirus NOT DETECTED NOT DETECTED Final   Influenza A NOT DETECTED NOT DETECTED Final   Influenza B NOT DETECTED NOT DETECTED Final   Parainfluenza Virus 1 NOT DETECTED NOT DETECTED Final   Parainfluenza Virus 2 NOT DETECTED NOT DETECTED Final   Parainfluenza Virus 3 NOT DETECTED NOT DETECTED Final   Parainfluenza Virus 4 NOT DETECTED NOT DETECTED Final   Respiratory Syncytial Virus NOT DETECTED NOT DETECTED Final   Bordetella pertussis NOT DETECTED NOT DETECTED Final   Bordetella Parapertussis NOT DETECTED NOT DETECTED Final   Chlamydophila pneumoniae NOT DETECTED NOT DETECTED Final   Mycoplasma pneumoniae NOT DETECTED NOT DETECTED Final    Comment: Performed at [facility], [address] [place], [place] [address]  C Difficile Quick Screen w PCR reflex     Status: None   Collection Time: 07/22/22  4:00 AM   Specimen: STOOL  Result Value Ref Range Status   C Diff antigen NEGATIVE NEGATIVE Final   C Diff toxin NEGATIVE NEGATIVE Final   C Diff interpretation No C. difficile detected.  Final    Comment: Performed at [facility], [address], [place], [place] [address]         Radiology Studies: No results found.      Scheduled Meds:  atorvastatin  80 mg Oral QPM   azithromycin  500 mg Oral Daily   buPROPion  300 mg Oral Daily   carvedilol  3.125 mg Oral BID   cyanocobalamin  500 mcg  Oral Daily   gabapentin  100 mg Oral BID   pantoprazole  40 mg Oral BID AC   Continuous  Infusions:  sodium chloride 50 mL/hr at 07/19/22 1841   cefTRIAXone (ROCEPHIN)  IV 2 g (07/21/22 1529)     LOS: 3 days        Huey Bienenstock, MD [facility]   To contact the attending provider between 7A-7P or the covering provider during after hours 7P-7A, please log into the web site [email or web address] and access using universal [place] password for that web site. If you do not have the password, please call the hospital operator.  07/22/2022, 1:48 PM

## 2022-07-22 NOTE — Progress Notes (Signed)
Mobility Specialist Progress Note:   07/22/22 0945  Mobility  Activity Transferred from bed to chair  Level of Assistance Minimal assist, patient does 75% or more  Assistive Device Other (Comment) (HHA)  Distance Ambulated (ft) 5 ft  Activity Response Tolerated well  Mobility Referral Yes  $Mobility charge 1 Mobility  Mobility Specialist Start Time (ACUTE ONLY) 0945  Mobility Specialist Stop Time (ACUTE ONLY) 1000  Mobility Specialist Time Calculation (min) (ACUTE ONLY) 15 min   Pt agreeable to mobility session. Required minA via HHA to transfer. VSS on 2LO2. Session limited by bowel and urine incontinent. Pt left in chair with all needs met.    Addison Lank Mobility Specialist Please contact via SecureChat or  Rehab office at [phone]

## 2022-07-22 NOTE — Progress Notes (Addendum)
   [facility] Progress Note  CC:  Black stool/heme positive   Subjective:   Still with nagging cough, does not feel like it is getting much better.  Still having diarrhea but Cdiff is negative.  Nurse said this AM it was more greenish in color.  Objective:  Vital signs in last 24 hours: Temp:  [97.8 F (36.6 C)-98.2 F (36.8 C)] 97.8 F (36.6 C) (05/18 0340) Pulse Rate:  [69-74] 72 (05/18 0400) Resp:  [20-23] 22 (05/18 0400) BP: (125-143)/(53-84) 125/84 (05/18 0400) SpO2:  [93 %-95 %] 93 % (05/18 0400) Last BM Date : 07/21/22 General:  Alert, Well-developed, in NAD Heart:  Regular rate and rhythm; no murmurs Pulm:  CTAB.  No W/R/R. Abdomen:  Soft, non-distended.  BS present.  Non-tender. Extremities:  Without edema. Neurologic:  Alert and oriented x 4;  grossly normal neurologically. Psych:  Alert and cooperative. Normal mood and affect.  Intake/Output from previous day: 05/17 0701 - 05/18 0700 In: -  Out: 700 [Urine:700]  Lab Results: Recent Labs    07/20/22 0349 07/20/22 1759 07/21/22 0556 07/22/22 0335  WBC 4.4  --  3.3* 3.2*  HGB 7.4* 7.4* 7.5* 7.4*  HCT 24.1*  --  24.4* 24.0*  PLT 131*  --  127* 136*   BMET Recent Labs    07/19/22 1410 07/20/22 0349 07/21/22 0556  NA 130* 131* 136  K 4.4 3.5 3.6  CL 101 101 107  CO2 18* 20* 20*  GLUCOSE 124* 103* 105*  BUN 37* 26* 13  CREATININE 1.69* 1.34* 1.09*  CALCIUM 8.2* 7.5* 7.5*   LFT Recent Labs    07/19/22 1410  PROT 6.3*  ALBUMIN 3.2*  AST 65*  ALT 25  ALKPHOS 33*  BILITOT 0.6   PT/INR Recent Labs    07/19/22 1410  LABPROT 17.9*  INR 1.5*   Assessment / Plan: *Dark/black stool, heme positive:  On pantoprazole 40 mg IV BID here. *Acute on chronic anemia:  Hgb down about 2 grams from a couple of months ago.  It is stable this AM at 7.4 grams, has been stable the past several reads. *Cough/left lower lobe pneumonia:  Tested positive for Metapnuemovirus on respiratory panel.   On 2L O2 here, none at home, but does not look in distress. *Atrial fibrillation on Eliquis:  Eliquis is on hold.   *GERD:  Reports frequent reflux at home despite pantoprazole 40 mg daily.  No history of EGD to her knowledge. *Hyponatremia:  Resolved.   -Will need EGD prior to discharge so that we can assess for high risk bleeding since she will need to resume her Eliquis.  This needs to wait until cough improves as if can make EGD technically difficult. -Trend Hgb. -Continue pantoprazole 40 mg BID. -Continue soft diet for now.   LOS: 3 days   Jessica D. Zehr  07/22/2022, 9:17 AM  I have taken an interval history, thoroughly reviewed the chart and examined the patient. I agree with the Advanced Practitioner's note, impression and recommendations, and have recorded additional findings, impressions and recommendations below. I performed a substantive portion of this encounter (>50% time spent), including a complete performance of the medical decision making.  My additional thoughts are as follows:  She looks more comfortable from a respiratory standpoint than when I last saw her.  She still has a dry cough, says she is trying to be minimally active and not talk much to keep it under control.  Loose stool, no melena,   no bright red blood per rectum.  Hemoglobin stable.  C. difficile negative, so suspect the diarrhea may at least in part be due to antibiotics.  I talked with her about an upper endoscopy on 07/24/2022.  She was agreeable after discussion of procedure and risks.  The benefits and risks of the planned procedure were described in detail with the patient or (when appropriate) their health care proxy.  Risks were outlined as including, but not limited to, bleeding, infection, perforation, adverse medication reaction leading to cardiac or pulmonary decompensation, pancreatitis (if ERCP).  The limitation of incomplete mucosal visualization was also discussed.  No guarantees or warranties  were given. Patient at increased risk for cardiopulmonary complications of procedure due to medical comorbidities.    Orders will be placed and we will continue current management in the meantime.  We will not plan to see this patient tomorrow unless called for a new or urgent issue.   Weaver Tweed L Danis III Office:[phone]     

## 2022-07-22 NOTE — Progress Notes (Signed)
[facility] night cross cover note:   I was notified by RN that the patient is having some refractory coughing, but is not yet eligible for her next prn dose of Tessalon Perles.  I subsequently placed a one-time order for an additional dose of Tessalon Perles to be administered now.    Newton Pigg, DO Hospitalist

## 2022-07-22 NOTE — H&P (View-Only) (Signed)
[facility] Progress Note  CC:  Black stool/heme positive   Subjective:   Still with nagging cough, does not feel like it is getting much better.  Still having diarrhea but Cdiff is negative.  Nurse said this AM it was more greenish in color.  Objective:  Vital signs in last 24 hours: Temp:  [97.8 F (36.6 C)-98.2 F (36.8 C)] 97.8 F (36.6 C) (05/18 0340) Pulse Rate:  [69-74] 72 (05/18 0400) Resp:  [20-23] 22 (05/18 0400) BP: (125-143)/(53-84) 125/84 (05/18 0400) SpO2:  [93 %-95 %] 93 % (05/18 0400) Last BM Date : 07/21/22 General:  Alert, Well-developed, in NAD Heart:  Regular rate and rhythm; no murmurs Pulm:  CTAB.  No W/R/R. Abdomen:  Soft, non-distended.  BS present.  Non-tender. Extremities:  Without edema. Neurologic:  Alert and oriented x 4;  grossly normal neurologically. Psych:  Alert and cooperative. Normal mood and affect.  Intake/Output from previous day: 05/17 0701 - 05/18 0700 In: -  Out: 700 [Urine:700]  Lab Results: Recent Labs    07/20/22 0349 07/20/22 1759 07/21/22 0556 07/22/22 0335  WBC 4.4  --  3.3* 3.2*  HGB 7.4* 7.4* 7.5* 7.4*  HCT 24.1*  --  24.4* 24.0*  PLT 131*  --  127* 136*   BMET Recent Labs    07/19/22 1410 07/20/22 0349 07/21/22 0556  NA 130* 131* 136  K 4.4 3.5 3.6  CL 101 101 107  CO2 18* 20* 20*  GLUCOSE 124* 103* 105*  BUN 37* 26* 13  CREATININE 1.69* 1.34* 1.09*  CALCIUM 8.2* 7.5* 7.5*   LFT Recent Labs    07/19/22 1410  PROT 6.3*  ALBUMIN 3.2*  AST 65*  ALT 25  ALKPHOS 33*  BILITOT 0.6   PT/INR Recent Labs    07/19/22 1410  LABPROT 17.9*  INR 1.5*   Assessment / Plan: *Dark/black stool, heme positive:  On pantoprazole 40 mg IV BID here. *Acute on chronic anemia:  Hgb down about 2 grams from a couple of months ago.  It is stable this AM at 7.4 grams, has been stable the past several reads. *Cough/left lower lobe pneumonia:  Tested positive for Metapnuemovirus on respiratory panel.   On 2L O2 here, none at home, but does not look in distress. *Atrial fibrillation on Eliquis:  Eliquis is on hold.   *GERD:  Reports frequent reflux at home despite pantoprazole 40 mg daily.  No history of EGD to her knowledge. *Hyponatremia:  Resolved.   -Will need EGD prior to discharge so that we can assess for high risk bleeding since she will need to resume her Eliquis.  This needs to wait until cough improves as if can make EGD technically difficult. -Trend Hgb. -Continue pantoprazole 40 mg BID. -Continue soft diet for now.   LOS: 3 days   Princella Pellegrini. Zehr  07/22/2022, 9:17 AM  I have taken an interval history, thoroughly reviewed the chart and examined the patient. I agree with the Advanced Practitioner's note, impression and recommendations, and have recorded additional findings, impressions and recommendations below. I performed a substantive portion of this encounter (>50% time spent), including a complete performance of the medical decision making.  My additional thoughts are as follows:  She looks more comfortable from a respiratory standpoint than when I last saw her.  She still has a dry cough, says she is trying to be minimally active and not talk much to keep it under control.  Loose stool, no melena,  no bright red blood per rectum.  Hemoglobin stable.  C. difficile negative, so suspect the diarrhea may at least in part be due to antibiotics.  I talked with her about an upper endoscopy on 07/24/2022.  She was agreeable after discussion of procedure and risks.  The benefits and risks of the planned procedure were described in detail with the patient or (when appropriate) their health care proxy.  Risks were outlined as including, but not limited to, bleeding, infection, perforation, adverse medication reaction leading to cardiac or pulmonary decompensation, pancreatitis (if ERCP).  The limitation of incomplete mucosal visualization was also discussed.  No guarantees or warranties  were given. Patient at increased risk for cardiopulmonary complications of procedure due to medical comorbidities.    Orders will be placed and we will continue current management in the meantime.  We will not plan to see this patient tomorrow unless called for a new or urgent issue.   Charlie Pitter III Office:[phone]

## 2022-07-23 DIAGNOSIS — D62 Acute posthemorrhagic anemia: Secondary | ICD-10-CM | POA: Diagnosis not present

## 2022-07-23 DIAGNOSIS — J189 Pneumonia, unspecified organism: Secondary | ICD-10-CM | POA: Diagnosis not present

## 2022-07-23 LAB — CBC
HCT: 24.8 % — ABNORMAL LOW (ref 36.0–46.0)
Hemoglobin: 7.4 g/dL — ABNORMAL LOW (ref 12.0–15.0)
MCH: 23.9 pg — ABNORMAL LOW (ref 26.0–34.0)
MCHC: 29.8 g/dL — ABNORMAL LOW (ref 30.0–36.0)
MCV: 80 fL (ref 80.0–100.0)
Platelets: 151 10*3/uL (ref 150–400)
RBC: 3.1 MIL/uL — ABNORMAL LOW (ref 3.87–5.11)
RDW: 17.4 % — ABNORMAL HIGH (ref 11.5–15.5)
WBC: 3.5 10*3/uL — ABNORMAL LOW (ref 4.0–10.5)
nRBC: 0 % (ref 0.0–0.2)

## 2022-07-23 LAB — CULTURE, BLOOD (ROUTINE X 2): Culture: NO GROWTH

## 2022-07-23 MED ORDER — SODIUM CHLORIDE 0.9 % IV SOLN
250.0000 mg | Freq: Every day | INTRAVENOUS | Status: AC
  Administered 2022-07-23: 250 mg via INTRAVENOUS
  Filled 2022-07-23 (×2): qty 20

## 2022-07-23 NOTE — Progress Notes (Signed)
PROGRESS NOTE    Ariel [name]  AVW:[id number] DOB: [birth year] DOA: 07/19/2022 PCP: Macy Mis, MD   No chief complaint on file.   Brief Narrative:    Ariel [name]  is a 77 y.o. female, of HTN and hx of stroke with residual left sided weakness, HLD, GERD, CKD stage 3b, depression, atrial fibrillation on Eliquis who presented to Ed with cough, shortness of breath, and dark stools.   -Patient reports feeling sick for last 4 to 5 days, congestion, cough and shortness of breath, as well she reports diarrhea over last 24 hours, melena light, dark in color, she had dental procedure 10 days ago for which she held Eliquis for 2 days, took amoxicillin x 3 doses, reports over the weekend she started to feel sick, she saw her PCP yesterday who started her on doxycycline which she has not started to take any, reported at home today where she lives with her niece, she had a fall in the bathtub, she denies any head trauma, loss of consciousness, but report generalized weakness and fatigue, she fell at her buttocks, for which they had to call EMS -In ED her workup significant for dehydration, sodium of 130, chest x-ray significant for left lower lobe opacity, hemoglobin low at 7.9, down from 10.1 this March, colonoscopy last October at [facility] significant for polyps, patient reports she never had endoscopy before, she denies any NSAIDs use, she was Hemoccult positive  Assessment & Plan:   Principal Problem:   Pneumonia Active Problems:   CVA (cerebral vascular accident) (HCC)   Benign essential HTN   Anxiety and depression   GERD (gastroesophageal reflux disease)   Stage 3b chronic kidney disease (HCC)   Paroxysmal atrial fibrillation (HCC)   Normocytic anemia   Melena   Acute diarrhea   Acute blood loss anemia     Left lower lobe pneumonia Metapneumovirus pneumonia -patient presents with cough, congestion, x-ray significant for left lower lobe pneumonia - continue with IV Rocephin and  azithromycin. -Follow on blood cultures.  So far remains negative -As well respiratory panel significant for metapneumovirus, continue with supportive care. -She was encouraged to use incentive spirometry and flutter valve -Cough much improved , globin remained stable, plan for endoscopy on Monday.   Microcytic anemia, GI blood loss anemia -Baseline hemoglobin 10-11, down to 7.9 mission, it is 7.4 today presents with melena -Continue to hold Eliquis - Colonoscopy 10/24/2021 significant for 3 subcentimeter polyp, and 110 mm polyp removed by cold snare's with 1 clip placed successfully. -GI input greatly appreciated, she may need endoscopy at 1 point, depending on respiratory status and improvement of her pneumonia, she is on clear liquid diet today, continue with IV Protonix and monitor CBC, keep active type and screen and transfuse as needed.  . -Hemoglobin remained stable, she has watery diarrhea, but no melena, plan for endoscopy tomorrow -Will transfuse IV iron.   B12 deficiency -continue with supplements, will lower dose given elevated level.   Falls/deconditioning -The setting of acute illness, will consult PT/OT   Hyperlipedemia - Continue with home dose statin    Paroxysmal A-fib -Eliquis on hold due to above, -On Coreg for heart rate control   Hyponatremia - Due to volume depletion, improving with IV fluids   Hypertension  -Blood pressure soft, will keep on as needed hydralazine and hold home medications, including losartan and amlodipine, continue with Coreg due to A-fib   GERD  -She will be on IV PPI    Anxiety/depression  -  Continue with home medications    Recurrent CVAs -Resume Eliquis once cleared by GI   CKD stage IIIb  -Renal function and at baseline, continue to monitor closely, avoid nephrotoxic medications      DVT prophylaxis:scd Code Status: Full Family Communication: none at bedside Disposition:   Status is: Inpatient    Consultants:   GI   Subjective:  Further melena, dyspnea and cough much improved. Objective: Vitals:   07/23/22 0400 07/23/22 0800 07/23/22 0920 07/23/22 1000  BP: (!) 154/63 (!) 181/66 (!) 174/66 (!) 140/59  Pulse: (!) 59 (!) 59 70 67  Resp: 18 16 18 20   Temp: 97.6 F (36.4 C)  97.7 F (36.5 C)   TempSrc: Oral  Oral   SpO2: 97% 97% 94% 96%    Intake/Output Summary (Last 24 hours) at 07/23/2022 1351 Last data filed at 07/23/2022 1000 Gross per 24 hour  Intake 2265 ml  Output 1650 ml  Net 615 ml   There were no vitals filed for this visit.  Examination:  Awake Alert, Oriented X 3, No new F.N deficits, Normal affect Symmetrical Chest wall movement, Good air movement bilaterally, CTAB RRR,No Gallops,Rubs or new Murmurs, No Parasternal Heave +ve B.Sounds, Abd Soft, No tenderness, No rebound - guarding or rigidity. No Cyanosis, Clubbing or edema, No new Rash or bruise      Data Reviewed: I have personally reviewed following labs and imaging studies  CBC: Recent Labs  Lab 07/19/22 1410 07/19/22 2142 07/20/22 0349 07/20/22 1759 07/21/22 0556 07/22/22 0335 07/23/22 0321  WBC 6.5 5.1 4.4  --  3.3* 3.2* 3.5*  NEUTROABS 4.9  --   --   --  1.9  --   --   HGB 7.9* 7.4* 7.4* 7.4* 7.5* 7.4* 7.4*  HCT 26.6* 24.2* 24.1*  --  24.4* 24.0* 24.8*  MCV 78.5* 77.3* 79.0*  --  78.2* 78.2* 80.0  PLT 132* 142* 131*  --  127* 136* 151    Basic Metabolic Panel: Recent Labs  Lab 07/19/22 1410 07/20/22 0349 07/21/22 0556  NA 130* 131* 136  K 4.4 3.5 3.6  CL 101 101 107  CO2 18* 20* 20*  GLUCOSE 124* 103* 105*  BUN 37* 26* 13  CREATININE 1.69* 1.34* 1.09*  CALCIUM 8.2* 7.5* 7.5*  MG  --   --  1.9  PHOS  --   --  2.7    GFR: CrCl cannot be calculated (Unknown ideal weight.).  Liver Function Tests: Recent Labs  Lab 07/19/22 1410  AST 65*  ALT 25  ALKPHOS 33*  BILITOT 0.6  PROT 6.3*  ALBUMIN 3.2*    CBG: No results for input(s): "GLUCAP" in the last 168 hours.   Recent  Results (from the past 240 hour(s))  Culture, blood (routine x 2)     Status: None (Preliminary result)   Collection Time: 07/19/22  2:21 PM   Specimen: BLOOD  Result Value Ref Range Status   Specimen Description BLOOD RIGHT ANTECUBITAL  Final   Special Requests   Final    BOTTLES DRAWN AEROBIC AND ANAEROBIC Blood Culture adequate volume   Culture   Final    NO GROWTH 4 DAYS Performed at [facility] Lab, [address] [place], [place] [address]    Report Status PENDING  Incomplete  Urine Culture     Status: None   Collection Time: 07/19/22  2:21 PM   Specimen: Urine, Clean Catch  Result Value Ref Range Status   Specimen Description  URINE, CLEAN CATCH  Final   Special Requests NONE  Final   Culture   Final    NO GROWTH Performed at [facility] Lab, [address] [place], [place] [address]    Report Status 07/21/2022 FINAL  Final  SARS Coronavirus 2 by RT PCR (hospital order, performed in [facility] hospital lab) *cepheid single result test*     Status: None   Collection Time: 07/19/22  2:21 PM   Specimen: Nasal Swab  Result Value Ref Range Status   SARS Coronavirus 2 by RT PCR NEGATIVE NEGATIVE Final    Comment: Performed at [facility] Lab, [address], [place], [place] [address]  Culture, blood (routine x 2)     Status: None (Preliminary result)   Collection Time: 07/19/22  2:26 PM   Specimen: BLOOD LEFT FOREARM  Result Value Ref Range Status   Specimen Description BLOOD LEFT FOREARM  Final   Special Requests   Final    BOTTLES DRAWN AEROBIC ONLY Blood Culture results may not be optimal due to an inadequate volume of blood received in culture bottles   Culture   Final    NO GROWTH 4 DAYS Performed at [facility] Lab, [address] [place], [place] [address]    Report Status PENDING  Incomplete  Respiratory (~20 pathogens) panel by PCR     Status: Abnormal   Collection Time: 07/19/22  4:34 PM   Specimen: Nasopharyngeal Swab; Respiratory  Result Value  Ref Range Status   Adenovirus NOT DETECTED NOT DETECTED Final   Coronavirus 229E NOT DETECTED NOT DETECTED Final    Comment: (NOTE) The Coronavirus on the Respiratory Panel, DOES NOT test for the novel  Coronavirus (2019 nCoV)    Coronavirus HKU1 NOT DETECTED NOT DETECTED Final   Coronavirus NL63 NOT DETECTED NOT DETECTED Final   Coronavirus OC43 NOT DETECTED NOT DETECTED Final   Metapneumovirus DETECTED (A) NOT DETECTED Final   Rhinovirus / Enterovirus NOT DETECTED NOT DETECTED Final   Influenza A NOT DETECTED NOT DETECTED Final   Influenza B NOT DETECTED NOT DETECTED Final   Parainfluenza Virus 1 NOT DETECTED NOT DETECTED Final   Parainfluenza Virus 2 NOT DETECTED NOT DETECTED Final   Parainfluenza Virus 3 NOT DETECTED NOT DETECTED Final   Parainfluenza Virus 4 NOT DETECTED NOT DETECTED Final   Respiratory Syncytial Virus NOT DETECTED NOT DETECTED Final   Bordetella pertussis NOT DETECTED NOT DETECTED Final   Bordetella Parapertussis NOT DETECTED NOT DETECTED Final   Chlamydophila pneumoniae NOT DETECTED NOT DETECTED Final   Mycoplasma pneumoniae NOT DETECTED NOT DETECTED Final    Comment: Performed at [facility] Lab, [address] [place], [place] [address]  C Difficile Quick Screen w PCR reflex     Status: None   Collection Time: 07/22/22  4:00 AM   Specimen: STOOL  Result Value Ref Range Status   C Diff antigen NEGATIVE NEGATIVE Final   C Diff toxin NEGATIVE NEGATIVE Final   C Diff interpretation No C. difficile detected.  Final    Comment: Performed at [facility] Lab, [address] [place], [place] [address]         Radiology Studies: No results found.      Scheduled Meds:  atorvastatin  80 mg Oral QPM   azithromycin  500 mg Oral Daily   buPROPion  300 mg Oral Daily   carvedilol  3.125 mg Oral BID   chlorpheniramine-HYDROcodone  5 mL Oral Q12H  cyanocobalamin  500 mcg Oral Daily   gabapentin  100 mg Oral BID   pantoprazole  40 mg Oral BID AC    Continuous Infusions:  sodium chloride 50 mL/hr at 07/23/22 1123   cefTRIAXone (ROCEPHIN)  IV 2 g (07/22/22 1419)     LOS: 4 days        Huey Bienenstock, MD [facility]   To contact the attending provider between 7A-7P or the covering provider during after hours 7P-7A, please log into the web site [email or web address] and access using universal [place] password for that web site. If you do not have the password, please call the hospital operator.  07/23/2022, 1:51 PM

## 2022-07-24 ENCOUNTER — Inpatient Hospital Stay (HOSPITAL_COMMUNITY): Payer: Medicare HMO | Admitting: Registered Nurse

## 2022-07-24 ENCOUNTER — Encounter (HOSPITAL_COMMUNITY)
Admission: EM | Disposition: A | Discharge status: Home / Self Care | Payer: Self-pay | Source: Home / Self Care | Attending: Internal Medicine

## 2022-07-24 ENCOUNTER — Encounter (HOSPITAL_COMMUNITY): Payer: Self-pay | Admitting: Internal Medicine

## 2022-07-24 DIAGNOSIS — K317 Polyp of stomach and duodenum: Secondary | ICD-10-CM | POA: Diagnosis not present

## 2022-07-24 DIAGNOSIS — F418 Other specified anxiety disorders: Secondary | ICD-10-CM | POA: Diagnosis not present

## 2022-07-24 DIAGNOSIS — I10 Essential (primary) hypertension: Secondary | ICD-10-CM

## 2022-07-24 DIAGNOSIS — K449 Diaphragmatic hernia without obstruction or gangrene: Secondary | ICD-10-CM

## 2022-07-24 DIAGNOSIS — J189 Pneumonia, unspecified organism: Secondary | ICD-10-CM | POA: Diagnosis not present

## 2022-07-24 DIAGNOSIS — D62 Acute posthemorrhagic anemia: Secondary | ICD-10-CM | POA: Diagnosis not present

## 2022-07-24 DIAGNOSIS — K921 Melena: Secondary | ICD-10-CM | POA: Diagnosis not present

## 2022-07-24 DIAGNOSIS — K922 Gastrointestinal hemorrhage, unspecified: Secondary | ICD-10-CM

## 2022-07-24 HISTORY — PX: GIVENS CAPSULE STUDY: SHX5432

## 2022-07-24 HISTORY — PX: ESOPHAGOGASTRODUODENOSCOPY (EGD) WITH PROPOFOL: SHX5813

## 2022-07-24 LAB — CBC
HCT: 24.9 % — ABNORMAL LOW (ref 36.0–46.0)
Hemoglobin: 7.6 g/dL — ABNORMAL LOW (ref 12.0–15.0)
MCH: 23.5 pg — ABNORMAL LOW (ref 26.0–34.0)
MCHC: 30.5 g/dL (ref 30.0–36.0)
MCV: 77.1 fL — ABNORMAL LOW (ref 80.0–100.0)
Platelets: 165 10*3/uL (ref 150–400)
RBC: 3.23 MIL/uL — ABNORMAL LOW (ref 3.87–5.11)
RDW: 17.2 % — ABNORMAL HIGH (ref 11.5–15.5)
WBC: 3.7 10*3/uL — ABNORMAL LOW (ref 4.0–10.5)
nRBC: 0 % (ref 0.0–0.2)

## 2022-07-24 LAB — CULTURE, BLOOD (ROUTINE X 2): Culture: NO GROWTH

## 2022-07-24 LAB — TYPE AND SCREEN
ABO/RH(D): A POS
Antibody Screen: NEGATIVE

## 2022-07-24 LAB — LEGIONELLA PNEUMOPHILA SEROGP 1 UR AG: L. pneumophila Serogp 1 Ur Ag: NEGATIVE

## 2022-07-24 SURGERY — ESOPHAGOGASTRODUODENOSCOPY (EGD) WITH PROPOFOL
Anesthesia: Monitor Anesthesia Care

## 2022-07-24 SURGERY — IMAGING PROCEDURE, GI TRACT, INTRALUMINAL, VIA CAPSULE
Anesthesia: LOCAL

## 2022-07-24 MED ORDER — PROPOFOL 500 MG/50ML IV EMUL
INTRAVENOUS | Status: DC | PRN
  Administered 2022-07-24: 125 ug/kg/min via INTRAVENOUS

## 2022-07-24 MED ORDER — SODIUM CHLORIDE 0.9 % IV SOLN: INTRAVENOUS | Status: DC

## 2022-07-24 MED ORDER — PROPOFOL 10 MG/ML IV BOLUS
INTRAVENOUS | Status: DC | PRN
  Administered 2022-07-24 (×4): 10 mg via INTRAVENOUS

## 2022-07-24 MED ORDER — LACTATED RINGERS IV SOLN: INTRAVENOUS | Status: DC | PRN

## 2022-07-24 SURGICAL SUPPLY — 15 items

## 2022-07-24 NOTE — Anesthesia Postprocedure Evaluation (Signed)
Anesthesia Post Note  Patient: Ariel [name]  Procedure(s) Performed: ESOPHAGOGASTRODUODENOSCOPY (EGD) WITH PROPOFOL     Patient location during evaluation: PACU Anesthesia Type: MAC Level of consciousness: awake and alert Pain management: pain level controlled Vital Signs Assessment: post-procedure vital signs reviewed and stable Respiratory status: spontaneous breathing, nonlabored ventilation and respiratory function stable Cardiovascular status: blood pressure returned to baseline Postop Assessment: no apparent nausea or vomiting Anesthetic complications: no   No notable events documented.  Last Vitals:  Vitals:   07/24/22 0940 07/24/22 0950  BP: (!) 133/51 (!) 134/40  Pulse: 68 71  Resp: (!) 24 20  Temp: (!) 36.2 C   SpO2: 96% 98%    Last Pain:  Vitals:   07/24/22 0950  TempSrc:   PainSc: Asleep                 Shanda Howells

## 2022-07-24 NOTE — Interval H&P Note (Signed)
History and Physical Interval Note: Patient has improved from respiratory status. Has a cough but breathing is better. She is not passing any further blood per rectum. BUN has downtrended to normal. Hgb stable. She does have an iron deficiency. Now off Eliquis a few days. EGD scheduled for today to further evaluate her dark stools / anemia in the setting of anticoagulation. I have discussed risks / benefits of the exam and anesthesia and she wishes to proceed. Further recommendations pending the results.    07/24/2022 9:11 AM  Ariel [name]  has presented today for surgery, with the diagnosis of GI bleeding and anemia.  The various methods of treatment have been discussed with the patient and family. After consideration of risks, benefits and other options for treatment, the patient has consented to  Procedure(s): ESOPHAGOGASTRODUODENOSCOPY (EGD) WITH PROPOFOL (N/A) as a surgical intervention.  The patient's history has been reviewed, patient examined, no change in status, stable for surgery.  I have reviewed the patient's chart and labs.  Questions were answered to the patient's satisfaction.     Viviann Spare P Wren Gallaga

## 2022-07-24 NOTE — Progress Notes (Signed)
PROGRESS NOTE    Ariel [name]  JXB:[id number] DOB: [birth year] DOA: 07/19/2022 PCP: Macy Mis, MD   No chief complaint on file.   Brief Narrative:    Ariel [name]  is a 77 y.o. female, of HTN and hx of stroke with residual left sided weakness, HLD, GERD, CKD stage 3b, depression, atrial fibrillation on Eliquis who presented to Ed with cough, shortness of breath, and dark stools.   -Patient reports feeling sick for last 4 to 5 days, congestion, cough and shortness of breath, as well she reports diarrhea over last 24 hours, melena light, dark in color, she had dental procedure 10 days ago for which she held Eliquis for 2 days, took amoxicillin x 3 doses, reports over the weekend she started to feel sick, she saw her PCP yesterday who started her on doxycycline which she has not started to take any, reported at home today where she lives with her niece, she had a fall in the bathtub, she denies any head trauma, loss of consciousness, but report generalized weakness and fatigue, she fell at her buttocks, for which they had to call EMS -In ED her workup significant for dehydration, sodium of 130, chest x-ray significant for left lower lobe opacity, hemoglobin low at 7.9, down from 10.1 this March, colonoscopy last October at [facility] significant for polyps, patient reports she never had endoscopy before, she denies any NSAIDs use, she was Hemoccult positive  Assessment & Plan:   Principal Problem:   Pneumonia Active Problems:   CVA (cerebral vascular accident) (HCC)   Benign essential HTN   Anxiety and depression   GERD (gastroesophageal reflux disease)   Stage 3b chronic kidney disease (HCC)   Paroxysmal atrial fibrillation (HCC)   Normocytic anemia   Melena   Acute diarrhea   Acute blood loss anemia   Upper GI bleed     Left lower lobe pneumonia Metapneumovirus pneumonia -patient presents with cough, congestion, x-ray significant for left lower lobe pneumonia - continue with IV  Rocephin and azithromycin. -Follow on blood cultures.  So far remains negative -As well respiratory panel significant for metapneumovirus, continue with supportive care. -She was encouraged to use incentive spirometry and flutter valve -Cough much improved , globin remained stable, plan for endoscopy on Monday.   Microcytic anemia, GI blood loss anemia -Baseline hemoglobin 10-11, down to 7.9 mission, it is 7.4 today presents with melena -Continue to hold Eliquis - Colonoscopy 10/24/2021 significant for 3 subcentimeter polyp, and 110 mm polyp removed by cold snare's with 1 clip placed successfully. -GI input greatly appreciated, she may need endoscopy at 1 point, depending on respiratory status and improvement of her pneumonia, she is on clear liquid diet today, continue with IV Protonix and monitor CBC, keep active type and screen and transfuse as needed.  . -Hemoglobin remained stable, she has watery diarrhea, but no melena, plan for endoscopy tomorrow -Continue with IV iron -She went for EGD today, it was unrevealing, so she had capsule study done today, will await findings of capsule endoscopy tomorrow and further recommendations.   B12 deficiency -continue with supplements, will lower dose given elevated level.   Falls/deconditioning -The setting of acute illness, will consult PT/OT   Hyperlipedemia - Continue with home dose statin    Paroxysmal A-fib -Eliquis on hold due to above, -On Coreg for heart rate control   Hyponatremia - Due to volume depletion, improving with IV fluids   Hypertension  -Blood pressure soft, will keep on as needed  hydralazine and hold home medications, including losartan and amlodipine, continue with Coreg due to A-fib   GERD  -She will be on IV PPI    Anxiety/depression  -Continue with home medications    Recurrent CVAs -Resume Eliquis once cleared by GI   CKD stage IIIb  -Renal function and at baseline, continue to monitor closely, avoid  nephrotoxic medications      DVT prophylaxis:scd Code Status: Full Family Communication: none at bedside Disposition:   Status is: Inpatient    Consultants:  GI   Subjective:  No significant events overnight, cough much improved, no further dyspnea Objective: Vitals:   07/24/22 0846 07/24/22 0940 07/24/22 0950 07/24/22 1000  BP: (!) 175/55 (!) 133/51 (!) 134/40 (!) 163/65  Pulse: 69 68 71 76  Resp: (!) 23 (!) 24 20 (!) 22  Temp: 97.6 F (36.4 C) (!) 97.1 F (36.2 C)    TempSrc: Temporal Temporal    SpO2: 96% 96% 98% 97%  Weight: 74 kg     Height: 5\' 6"  (1.676 m)       Intake/Output Summary (Last 24 hours) at 07/24/2022 1414 Last data filed at 07/24/2022 1243 Gross per 24 hour  Intake 1579.21 ml  Output 2300 ml  Net -720.79 ml   Filed Weights   07/24/22 0846  Weight: 74 kg    Examination:  Awake Alert, Oriented X 3, No new F.N deficits, Normal affect Symmetrical Chest wall movement, Good air movement bilaterally, CTAB RRR,No Gallops,Rubs or new Murmurs, No Parasternal Heave +ve B.Sounds, Abd Soft, No tenderness, No rebound - guarding or rigidity. No Cyanosis, Clubbing or edema, No new Rash or bruise       Data Reviewed: I have personally reviewed following labs and imaging studies  CBC: Recent Labs  Lab 07/19/22 1410 07/19/22 2142 07/20/22 0349 07/20/22 1759 07/21/22 0556 07/22/22 0335 07/23/22 0321 07/24/22 0439  WBC 6.5   < > 4.4  --  3.3* 3.2* 3.5* 3.7*  NEUTROABS 4.9  --   --   --  1.9  --   --   --   HGB 7.9*   < > 7.4* 7.4* 7.5* 7.4* 7.4* 7.6*  HCT 26.6*   < > 24.1*  --  24.4* 24.0* 24.8* 24.9*  MCV 78.5*   < > 79.0*  --  78.2* 78.2* 80.0 77.1*  PLT 132*   < > 131*  --  127* 136* 151 165   < > = values in this interval not displayed.    Basic Metabolic Panel: Recent Labs  Lab 07/19/22 1410 07/20/22 0349 07/21/22 0556  NA 130* 131* 136  K 4.4 3.5 3.6  CL 101 101 107  CO2 18* 20* 20*  GLUCOSE 124* 103* 105*  BUN 37* 26* 13   CREATININE 1.69* 1.34* 1.09*  CALCIUM 8.2* 7.5* 7.5*  MG  --   --  1.9  PHOS  --   --  2.7    GFR: Estimated Creatinine Clearance: 45.9 mL/min (A) (by C-G formula based on SCr of 1.09 mg/dL (H)).  Liver Function Tests: Recent Labs  Lab 07/19/22 1410  AST 65*  ALT 25  ALKPHOS 33*  BILITOT 0.6  PROT 6.3*  ALBUMIN 3.2*    CBG: No results for input(s): "GLUCAP" in the last 168 hours.   Recent Results (from the past 240 hour(s))  Culture, blood (routine x 2)     Status: None   Collection Time: 07/19/22  2:21 PM   Specimen: BLOOD  Result  Value Ref Range Status   Specimen Description BLOOD RIGHT ANTECUBITAL  Final   Special Requests   Final    BOTTLES DRAWN AEROBIC AND ANAEROBIC Blood Culture adequate volume   Culture   Final    NO GROWTH 5 DAYS Performed at [facility], [address], [place], [place] [address]    Report Status 07/24/2022 FINAL  Final  Urine Culture     Status: None   Collection Time: 07/19/22  2:21 PM   Specimen: Urine, Clean Catch  Result Value Ref Range Status   Specimen Description URINE, CLEAN CATCH  Final   Special Requests NONE  Final   Culture   Final    NO GROWTH Performed at [facility] Lab, [address] [place], [place] [address]    Report Status 07/21/2022 FINAL  Final  SARS Coronavirus 2 by RT PCR (hospital order, performed in [facility] hospital lab) *cepheid single result test*     Status: None   Collection Time: 07/19/22  2:21 PM   Specimen: Nasal Swab  Result Value Ref Range Status   SARS Coronavirus 2 by RT PCR NEGATIVE NEGATIVE Final    Comment: Performed at [facility] Lab, [address] [place], [place] [address]  Culture, blood (routine x 2)     Status: None   Collection Time: 07/19/22  2:26 PM   Specimen: BLOOD LEFT FOREARM  Result Value Ref Range Status   Specimen Description BLOOD LEFT FOREARM  Final   Special Requests   Final    BOTTLES DRAWN AEROBIC ONLY Blood Culture results may not be optimal  due to an inadequate volume of blood received in culture bottles   Culture   Final    NO GROWTH 5 DAYS Performed at [facility] Lab, [address] [place], [place] [address]    Report Status 07/24/2022 FINAL  Final  Respiratory (~20 pathogens) panel by PCR     Status: Abnormal   Collection Time: 07/19/22  4:34 PM   Specimen: Nasopharyngeal Swab; Respiratory  Result Value Ref Range Status   Adenovirus NOT DETECTED NOT DETECTED Final   Coronavirus 229E NOT DETECTED NOT DETECTED Final    Comment: (NOTE) The Coronavirus on the Respiratory Panel, DOES NOT test for the novel  Coronavirus (2019 nCoV)    Coronavirus HKU1 NOT DETECTED NOT DETECTED Final   Coronavirus NL63 NOT DETECTED NOT DETECTED Final   Coronavirus OC43 NOT DETECTED NOT DETECTED Final   Metapneumovirus DETECTED (A) NOT DETECTED Final   Rhinovirus / Enterovirus NOT DETECTED NOT DETECTED Final   Influenza A NOT DETECTED NOT DETECTED Final   Influenza B NOT DETECTED NOT DETECTED Final   Parainfluenza Virus 1 NOT DETECTED NOT DETECTED Final   Parainfluenza Virus 2 NOT DETECTED NOT DETECTED Final   Parainfluenza Virus 3 NOT DETECTED NOT DETECTED Final   Parainfluenza Virus 4 NOT DETECTED NOT DETECTED Final   Respiratory Syncytial Virus NOT DETECTED NOT DETECTED Final   Bordetella pertussis NOT DETECTED NOT DETECTED Final   Bordetella Parapertussis NOT DETECTED NOT DETECTED Final   Chlamydophila pneumoniae NOT DETECTED NOT DETECTED Final   Mycoplasma pneumoniae NOT DETECTED NOT DETECTED Final    Comment: Performed at [facility] Lab, [address], [place], [place] [address]  C Difficile Quick Screen w PCR reflex     Status: None   Collection Time: 07/22/22  4:00 AM   Specimen: STOOL  Result Value Ref Range Status   C Diff  antigen NEGATIVE NEGATIVE Final   C Diff toxin NEGATIVE NEGATIVE Final   C Diff interpretation No C. difficile detected.  Final    Comment: Performed at [facility] Lab, [address],  [place], [place] [address]         Radiology Studies: No results found.      Scheduled Meds:  atorvastatin  80 mg Oral QPM   azithromycin  500 mg Oral Daily   buPROPion  300 mg Oral Daily   carvedilol  3.125 mg Oral BID   chlorpheniramine-HYDROcodone  5 mL Oral Q12H   cyanocobalamin  500 mcg Oral Daily   gabapentin  100 mg Oral BID   pantoprazole  40 mg Oral BID AC   Continuous Infusions:  sodium chloride Stopped (07/24/22 0825)   cefTRIAXone (ROCEPHIN)  IV Stopped (07/23/22 1514)   ferric gluconate (FERRLECIT) IVPB Stopped (07/23/22 1800)     LOS: 5 days        Huey Bienenstock, MD [facility]   To contact the attending provider between 7A-7P or the covering provider during after hours 7P-7A, please log into the web site [email or web address] and access using universal [place] password for that web site. If you do not have the password, please call the hospital operator.  07/24/2022, 2:14 PM

## 2022-07-24 NOTE — Progress Notes (Signed)
[facility] capsule endoscopy ordered by MD Armbruster.  Patient ingested capsule at 1000am.  Per Given's capsule instructions, patient to remain NPO until 1200 at which time they may progress to clear liquid diet. At 1400 patient may have a small snack such as a half a sandwich or a bowl of soup. At 1800 patient may progress to previously ordered diet.  The capsule endoscopy study will conclude at 2200pm at which time the recorder and leads or belt can be removed and placed in a patient belongings bag. Endoscopy staff will pick up the equipment in the AM.  Instructions provided to patient and inpatient RN. Patient and RN demonstrated understanding.

## 2022-07-24 NOTE — Anesthesia Procedure Notes (Signed)
Procedure Name: MAC Date/Time: 07/24/2022 9:19 AM  Performed by: Rachel Moulds, CRNAPre-anesthesia Checklist: Patient identified, Emergency Drugs available, Suction available, Patient being monitored and Timeout performed Patient Re-evaluated:Patient Re-evaluated prior to induction Oxygen Delivery Method: Nasal cannula Placement Confirmation: breath sounds checked- equal and bilateral Dental Injury: Teeth and Oropharynx as per pre-operative assessment

## 2022-07-24 NOTE — Op Note (Signed)
[facility] Patient Name: Ariel [name] Procedure Date : 07/24/2022 MRN: [id number] Attending MD: Willaim Rayas. Adela Lank , MD, [id number] Date of Birth: 01-May-1945 CSN: [id number] Age: 77 Admit Type: Inpatient Procedure:                Upper GI endoscopy Indications:              Melena / dark stools, anemia, iron deficiency on                            Eliquis. Colonoscopy 10/2021 with some polyps                            reported. BUN initially elevated but has since                            normalized, suspected upper tract bleeding Providers:                Willaim Rayas. Adela Lank, MD, Marja Kays,                            Technician, Fransisca Connors Referring MD:              Medicines:                Monitored Anesthesia Care Complications:            No immediate complications. Estimated blood loss:                            None. Estimated Blood Loss:     Estimated blood loss: none. Procedure:                Pre-Anesthesia Assessment:                           - Prior to the procedure, a History and Physical                            was performed, and patient medications and                            allergies were reviewed. The patient's tolerance of                            previous anesthesia was also reviewed. The risks                            and benefits of the procedure and the sedation                            options and risks were discussed with the patient.                            All questions were answered, and informed consent  was obtained. Prior Anticoagulants: The patient has                            taken Eliquis (apixaban), last dose was 5 days                            prior to procedure. ASA Grade Assessment: III - A                            patient with severe systemic disease. After                            reviewing the risks and benefits, the patient was                             deemed in satisfactory condition to undergo the                            procedure.                           After obtaining informed consent, the endoscope was                            passed under direct vision. Throughout the                            procedure, the patient's blood pressure, pulse, and                            oxygen saturations were monitored continuously. The                            [id number]-H190 ([id number]) Olympus endoscope was introduced                            through the mouth, and advanced to the second part                            of duodenum. The upper GI endoscopy was                            accomplished without difficulty. The patient                            tolerated the procedure well. Scope In: Scope Out: Findings:      Esophagogastric landmarks were identified: the Z-line was found at 35       cm, the gastroesophageal junction was found at 35 cm and the upper       extent of the gastric folds was found at 38 cm from the incisors.      A 3 cm hiatal hernia was present.      The exam of the esophagus was otherwise normal.      A few small sessile polyps  were found in the gastric body grossly       consistent with benign fundic gland polyps.      The exam of the stomach was otherwise normal.      The duodenal bulb and second portion of the duodenum were normal. Impression:               - Esophagogastric landmarks identified.                           - 3 cm hiatal hernia.                           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                           - Normal stomach otherwise                           - Normal duodenal bulb and second portion of the                            duodenum.                           No heme anywhere on this exam or any pathology to                            account for upper tract bleeding.  Colonoscopy up to                            date < 1 year ago. Will BUN elevation, suspect                            small bowel source. Will discuss if she is willing                            to proceed with capsule endoscopy and if so can                            place it today. Recommendation:           - Return patient to hospital ward for ongoing care.                           - Continue present medications.                           - If patient willing, will proceed with capsule  endoscopy and do that today if possible, with                            result hopefully tomorrow. Post capsule endoscopy                            diet later today Procedure Code(s):        --- Professional ---                           [phone], Esophagogastroduodenoscopy, flexible,                            transoral; diagnostic, including collection of                            specimen(s) by brushing or washing, when performed                            (separate procedure) Diagnosis Code(s):        --- Professional ---                           K44.9, Diaphragmatic hernia without obstruction or                            gangrene                           K31.7, Polyp of stomach and duodenum                           K92.1, Melena (includes Hematochezia) CPT copyright 2022 [employer]. All rights reserved. The codes documented in this report are preliminary and upon coder review may  be revised to meet current compliance requirements. Viviann Spare P. Naziah Weckerly, MD 07/24/2022 9:44:39 AM This report has been signed electronically. Number of Addenda: 0

## 2022-07-24 NOTE — Progress Notes (Signed)
EGD unrevealing. Plan is for capsule study today. Unable to give bowel prep 2/2 to listed allergies

## 2022-07-24 NOTE — Progress Notes (Signed)
Physical Therapy Treatment Patient Details Name: Ariel [name] MRN: [id number] DOB: 1945-05-29 Today's Date: 07/24/2022   History of Present Illness Patient is 77 y.o. female who presented 07/19/22 to ED with cough, shortness of breath, and dark stools. ED workup significant for dehydration, sodium of 130, chest x-ray significant for LLL opacity, hemoglobin low at 7.9, down from 10.1 this March, colonoscopy last October at [facility] significant for polyps, patient reports she never had endoscopy before, she denies any NSAIDs use, she was Hemoccult positive. Admitted for PNA and GIB. PMH significant for HTN, Stroke.    PT Comments    Pt greeted resting in bed and agreeable to session with continued progress towards acute goals. Pt able to come to sitting EOB with min guard for safety without need for physical assist this session. Pt able to come to stand with min A and progress gait in room with RW support with up to mod A to steady as pt with tendency for posterior lean in standing needing increased verbal and tactile cues to shift weight anterior through toes during static standing and gait to not lose balance posteriorly. Pt able to complete x5 sit<>stands at end of session for increased endurance and LE strength, with increased cues and focus on safe and controlled descent. Current plan remains appropriate to address deficits and maximize functional independence and decrease caregiver burden. Pt continues to benefit from skilled PT services to progress toward functional mobility goals.     Recommendations for follow up therapy are one component of a multi-disciplinary discharge planning process, led by the attending physician.  Recommendations may be updated based on patient status, additional functional criteria and insurance authorization.  Follow Up Recommendations       Assistance Recommended at Discharge Frequent or constant Supervision/Assistance  Patient can return home with the following A  lot of help with walking and/or transfers;A little help with bathing/dressing/bathroom;Assistance with cooking/housework;Direct supervision/assist for medications management;Assist for transportation;Help with stairs or ramp for entrance   Equipment Recommendations  None recommended by PT    Recommendations for Other Services       Precautions / Restrictions Precautions Precautions: Fall Precaution Comments: watch O2 Restrictions Weight Bearing Restrictions: No     Mobility  Bed Mobility Overal bed mobility: Needs Assistance Bed Mobility: Supine to Sit     Supine to sit: HOB elevated, Min guard     General bed mobility comments: min guard and increased time, pt without need for physical assist    Transfers Overall transfer level: Needs assistance Equipment used: Rolling walker (2 wheels) Transfers: Sit to/from Stand, Bed to chair/wheelchair/BSC Sit to Stand: Min assist, Min guard           General transfer comment: Min assist to power up to RW, cues for hand placement to initiate rise, pt needing increased cues to shift weight anterior and flex hips before coming to sit as pt with tendency to lean posterior when coming to sit    Ambulation/Gait Ambulation/Gait assistance: Mod assist, Min assist Gait Distance (Feet): 20 Feet Assistive device: Rolling walker (2 wheels) Gait Pattern/deviations: Step-through pattern, Decreased stride length, Decreased dorsiflexion - right, Decreased dorsiflexion - left, Shuffle Gait velocity: decr/slow     General Gait Details: pt with slow shortened shuffled steps. pt required mod assist to guide walker with cues to direct steps for turn around end of bed. assist to steady and prevent posterior LOB   Stairs  Wheelchair Mobility    Modified Rankin (Stroke Patients Only)       Balance Overall balance assessment: Needs assistance Sitting-balance support: Feet supported Sitting balance-Leahy Scale:  Fair Sitting balance - Comments: initially assist needed to obtain balance, able to hold with bil UE use   Standing balance support: During functional activity, Reliant on assistive device for balance, Bilateral upper extremity supported Standing balance-Leahy Scale: Poor Standing balance comment: requires assist and UE supprot                            Cognition Arousal/Alertness: Awake/alert Behavior During Therapy: WFL for tasks assessed/performed Overall Cognitive Status: Within Functional Limits for tasks assessed                                          Exercises Other Exercises Other Exercises: serisl sit<>stand x5 with concentration on controlled descent to sit    General Comments General comments (skin integrity, edema, etc.): VSS on supplemental O2      Pertinent Vitals/Pain Pain Assessment Pain Assessment: No/denies pain    Home Living                          Prior Function            PT Goals (current goals can now be found in the care plan section) Acute Rehab PT Goals PT Goal Formulation: With patient Time For Goal Achievement: 08/03/22 Progress towards PT goals: Progressing toward goals    Frequency    Min 3X/week      PT Plan      Co-evaluation              AM-PAC PT "6 Clicks" Mobility   Outcome Measure  Help needed turning from your back to your side while in a flat bed without using bedrails?: A Little Help needed moving from lying on your back to sitting on the side of a flat bed without using bedrails?: A Little Help needed moving to and from a bed to a chair (including a wheelchair)?: A Little Help needed standing up from a chair using your arms (e.g., wheelchair or bedside chair)?: A Little Help needed to walk in hospital room?: A Lot Help needed climbing 3-5 steps with a railing? : Total 6 Click Score: 15    End of Session Equipment Utilized During Treatment: Gait  belt;Oxygen Activity Tolerance: Patient tolerated treatment well Patient left: in chair;with call bell/phone within reach;with nursing/sitter in room Nurse Communication: Mobility status PT Visit Diagnosis: Unsteadiness on feet (R26.81);Muscle weakness (generalized) (M62.81);Other abnormalities of gait and mobility (R26.89);Difficulty in walking, not elsewhere classified (R26.2)     Time: 1610-9604 PT Time Calculation (min) (ACUTE ONLY): 25 min  Charges:  $Gait Training: 8-22 mins $Therapeutic Activity: 8-22 mins                     Riel Hirschman R. PTA Acute Rehabilitation Services Office: [phone]   Catalina Antigua 07/24/2022, 2:33 PM

## 2022-07-24 NOTE — Progress Notes (Signed)
Capsule endoscopy recorder and lead belt removed as per the written instruction. Placed on the pt's belonging bag and within the bedside.

## 2022-07-24 NOTE — Anesthesia Preprocedure Evaluation (Addendum)
Anesthesia Evaluation  Patient identified by MRN, date of birth, ID band Patient awake    Reviewed: Allergy & Precautions, NPO status , Patient's Chart, lab work & pertinent test results, reviewed documented beta blocker date and time   History of Anesthesia Complications Negative for: history of anesthetic complications  Airway Mallampati: II  TM Distance: >3 FB Neck ROM: Full    Dental  (+) Edentulous Lower, Edentulous Upper   Pulmonary neg pulmonary ROS   Pulmonary exam normal        Cardiovascular hypertension, Pt. on medications and Pt. on home beta blockers Normal cardiovascular exam+ dysrhythmias (on Eliquis) Atrial Fibrillation      Neuro/Psych   Anxiety Depression    CVA    GI/Hepatic Neg liver ROS,GERD  Medicated,,GIB   Endo/Other  negative endocrine ROS    Renal/GU Renal InsufficiencyRenal disease     Musculoskeletal negative musculoskeletal ROS (+)    Abdominal   Peds  Hematology  (+) Blood dyscrasia (Hgb 7.6, INR 1.5), anemia   Anesthesia Other Findings Day of surgery medications reviewed with patient.  Reproductive/Obstetrics                              Anesthesia Physical Anesthesia Plan  ASA: 3  Anesthesia Plan: MAC   Post-op Pain Management: Minimal or no pain anticipated   Induction:   PONV Risk Score and Plan: 2 and Treatment may vary due to age or medical condition and Propofol infusion  Airway Management Planned: Natural Airway and Nasal Cannula  Additional Equipment: None  Intra-op Plan:   Post-operative Plan:   Informed Consent: I have reviewed the patients History and Physical, chart, labs and discussed the procedure including the risks, benefits and alternatives for the proposed anesthesia with the patient or authorized representative who has indicated his/her understanding and acceptance.       Plan Discussed with: CRNA  Anesthesia Plan  Comments:         Anesthesia Quick Evaluation

## 2022-07-24 NOTE — Progress Notes (Signed)
OT Cancellation Note  Patient Details Name: MAKYLIA RADOSEVICH MRN: [id number] DOB: 1946-01-03   Cancelled Treatment:    Reason Eval/Treat Not Completed: Patient at procedure or test/ unavailable (endo. Will follow up at a later time.)  [facility] 07/24/2022, 9:15 AM Luisa Dago, OT/L   Acute OT Clinical Specialist Acute Rehabilitation Services Pager [phone] Office [phone]

## 2022-07-24 NOTE — Transfer of Care (Signed)
Immediate Anesthesia Transfer of Care Note  Patient: Ariel [name]  Procedure(s) Performed: ESOPHAGOGASTRODUODENOSCOPY (EGD) WITH PROPOFOL  Patient Location: Endoscopy Unit  Anesthesia Type:MAC  Level of Consciousness: awake and alert   Airway & Oxygen Therapy: Patient Spontanous Breathing and Patient connected to nasal cannula oxygen  Post-op Assessment: Report given to RN and Post -op Vital signs reviewed and stable  Post vital signs: Reviewed and stable  Last Vitals:  Vitals Value Taken Time  BP 133/51 07/24/22 0940  Temp    Pulse 67 07/24/22 0941  Resp 24 07/24/22 0941  SpO2 95 % 07/24/22 0941  Vitals shown include unvalidated device data.  Last Pain:  Vitals:   07/24/22 0846  TempSrc: Temporal  PainSc: 0-No pain         Complications: No notable events documented.

## 2022-07-24 NOTE — Progress Notes (Signed)
Pt has persistent dry cough in the beginning of the shift and haven't heard her coughing after giving Tussionex. Pt had a good sleep throughout the night.

## 2022-07-25 DIAGNOSIS — D509 Iron deficiency anemia, unspecified: Secondary | ICD-10-CM

## 2022-07-25 DIAGNOSIS — Z7901 Long term (current) use of anticoagulants: Secondary | ICD-10-CM

## 2022-07-25 DIAGNOSIS — R195 Other fecal abnormalities: Secondary | ICD-10-CM

## 2022-07-25 DIAGNOSIS — K921 Melena: Secondary | ICD-10-CM | POA: Diagnosis not present

## 2022-07-25 DIAGNOSIS — D62 Acute posthemorrhagic anemia: Secondary | ICD-10-CM | POA: Diagnosis not present

## 2022-07-25 DIAGNOSIS — E611 Iron deficiency: Secondary | ICD-10-CM | POA: Diagnosis not present

## 2022-07-25 DIAGNOSIS — J189 Pneumonia, unspecified organism: Secondary | ICD-10-CM | POA: Diagnosis not present

## 2022-07-25 LAB — CBC
HCT: 26.4 % — ABNORMAL LOW (ref 36.0–46.0)
Hemoglobin: 8 g/dL — ABNORMAL LOW (ref 12.0–15.0)
MCH: 24 pg — ABNORMAL LOW (ref 26.0–34.0)
MCHC: 30.3 g/dL (ref 30.0–36.0)
MCV: 79 fL — ABNORMAL LOW (ref 80.0–100.0)
Platelets: 170 10*3/uL (ref 150–400)
RBC: 3.34 MIL/uL — ABNORMAL LOW (ref 3.87–5.11)
RDW: 17.2 % — ABNORMAL HIGH (ref 11.5–15.5)
WBC: 4.3 10*3/uL (ref 4.0–10.5)
nRBC: 0 % (ref 0.0–0.2)

## 2022-07-25 MED ORDER — SODIUM CHLORIDE 0.9 % IV SOLN
250.0000 mg | Freq: Every day | INTRAVENOUS | Status: AC
  Administered 2022-07-25 – 2022-07-26 (×2): 250 mg via INTRAVENOUS
  Filled 2022-07-25 (×2): qty 20

## 2022-07-25 NOTE — Progress Notes (Signed)
Occupational Therapy Treatment Patient Details Name: Ariel [name] MRN: [id number] DOB: 06-24-1945 Today's Date: 07/25/2022   History of present illness Patient is 77 y.o. female who presented 07/19/22 to ED with cough, shortness of breath, and dark stools. ED workup significant for dehydration, sodium of 130, chest x-ray significant for LLL opacity, hemoglobin low at 7.9, down from 10.1 this March, [facility] significant for polyps, patient reports she never had endoscopy before, she denies any NSAIDs use, she was Hemoccult positive. Admitted for PNA and GIB. PMH significant for HTN, Stroke.   OT comments  Pt sitting in recliner upon OT arrival. Pt agreeable to participation in skilled OT session. OT instructed pt in energy conservation techniques for increased safety and independence with ADLs and functional transfers/mobility with pt verbalizing understanding and demonstrating understanding through teach back. Pt will benefit from reinforcement of training. OT also instructed pt in B UE AROM exercises to increase general B UE strength and activity tolerance for carryover to ADLs and functional mobility/transfers. Pt participated well and is making good progress toward OT goals. Pt will benefit from continued acute skilled OT services. Discharge plan remains appropriate.    Recommendations for follow up therapy are one component of a multi-disciplinary discharge planning process, led by the attending physician.  Recommendations may be updated based on patient status, additional functional criteria and insurance authorization.    Assistance Recommended at Discharge Frequent or constant Supervision/Assistance  Patient can return home with the following  A little help with walking and/or transfers;A lot of help with bathing/dressing/bathroom;Assistance with cooking/housework;Assist for transportation;Help with stairs or ramp for entrance   Equipment Recommendations  None  recommended by OT    Recommendations for Other Services      Precautions / Restrictions Precautions Precautions: Fall Precaution Comments: watch O2 Restrictions Weight Bearing Restrictions: No       Mobility Bed Mobility                    Transfers                         Balance                                           ADL either performed or assessed with clinical judgement   ADL                                         General ADL Comments: Pt instructed in energy conservation techniques for increased safety and independence with ADLs. Pt verbalized understanding and demonstrated understanding of education through teach back. Pt will benefit from reinforcement of education.    Extremity/Trunk Assessment              Vision       Perception     Praxis      Cognition Arousal/Alertness: Awake/alert Behavior During Therapy: WFL for tasks assessed/performed Overall Cognitive Status: Within Functional Limits for tasks assessed                                          Exercises Exercises: General Upper Extremity General Exercises -  Upper Extremity Shoulder Flexion: AROM, Other reps (comment), Both, Seated, Strengthening (3 sets of 10 reps) Shoulder Extension: AROM, Strengthening, Both, Other reps (comment), Seated (3 sets of 10 reps) Shoulder ABduction: AROM, Strengthening, Both, Other reps (comment), Seated (3 sets of 10 reps) Shoulder ADduction: AROM, Strengthening, Both, Other reps (comment), Seated (3 sets of 10 reps) Shoulder Horizontal ABduction: AROM, Strengthening, Both, Other reps (comment), Seated (3 sets of 10 reps) Shoulder Horizontal ADduction: AROM, Strengthening, Both, Other reps (comment), Seated (3 sets of 10 reps) Elbow Flexion: AROM, Strengthening, Both, Other reps (comment), Seated (3 sets of 10 reps) Elbow Extension: AROM, Strengthening, Both, Other reps (comment),  Seated (3 sets of 10 reps)    Shoulder Instructions       General Comments VSS on 2L continuous O2 through nasal cannula. Pt required occasional rest breaks with exercises.    Pertinent Vitals/ Pain       Pain Assessment Pain Assessment: No/denies pain  Home Living                                          Prior Functioning/Environment              Frequency           Progress Toward Goals  OT Goals(current goals can now be found in the care plan section)  Progress towards OT goals: Progressing toward goals  Acute Rehab OT Goals Patient Stated Goal: To get stronger and return home with neice  Plan Discharge plan remains appropriate    Co-evaluation          OT goals addressed during session: ADL's and self-care;Strengthening/ROM      AM-PAC OT "6 Clicks" Daily Activity     Outcome Measure   Help from another person eating meals?: A Little Help from another person taking care of personal grooming?: A Little Help from another person toileting, which includes using toliet, bedpan, or urinal?: A Little Help from another person bathing (including washing, rinsing, drying)?: A Lot Help from another person to put on and taking off regular upper body clothing?: A Little Help from another person to put on and taking off regular lower body clothing?: A Lot 6 Click Score: 16    End of Session Equipment Utilized During Treatment: Oxygen  OT Visit Diagnosis: Unsteadiness on feet (R26.81);Repeated falls (R29.6);Muscle weakness (generalized) (M62.81);History of falling (Z91.81);Other (comment) (decreased activity tolerance)   Activity Tolerance Patient tolerated treatment well   Patient Left in chair;with call bell/phone within reach;with chair alarm set   Nurse Communication Mobility status        Time: 1501-1530 OT Time Calculation (min): 29 min  Charges: OT General Charges $OT Visit: 1 Visit OT Treatments $Self Care/Home Management :  8-22 mins $Therapeutic Exercise: 8-22 mins  Tashauna Caisse "Orson Eva., OTR/L, MA Acute Rehab [phone]   Lendon Colonel 07/25/2022, 5:09 PM

## 2022-07-25 NOTE — Progress Notes (Signed)
PROGRESS NOTE    JEHLANI [name]  ZOX:[id number] DOB: [birth year] DOA: 07/19/2022 PCP: Macy Mis, MD   No chief complaint on file.   Brief Narrative:    Ariel [name]  is a 77 y.o. female, of HTN and hx of stroke with residual left sided weakness, HLD, GERD, CKD stage 3b, depression, atrial fibrillation on Eliquis who presented to Ed with cough, shortness of breath, and dark stools.  Workup was significant for pneumonia, metapneumovirus infection, and acute blood loss anemia, she was seen by GI, recommendation for endoscopy, which has to be postponed for couple days due to her significant nagging cough, she went for endoscopy 5/20, with no acute finding, so capsule endoscopy has been performed.  Assessment & Plan:   Principal Problem:   Pneumonia Active Problems:   CVA (cerebral vascular accident) (HCC)   Benign essential HTN   Anxiety and depression   GERD (gastroesophageal reflux disease)   Stage 3b chronic kidney disease (HCC)   Paroxysmal atrial fibrillation (HCC)   Normocytic anemia   Melena   Acute diarrhea   Acute blood loss anemia   Upper GI bleed     Left lower lobe pneumonia Metapneumovirus pneumonia -patient presents with cough, congestion, x-ray significant for left lower lobe pneumonia -Treated with IV Rocephin and azithromycin. -Follow on blood cultures.  So far remains negative -As well respiratory panel significant for metapneumovirus, continue with supportive care. -She was encouraged to use incentive spirometry and flutter valve -Cough much improved   Microcytic anemia, GI blood loss anemia -Baseline hemoglobin 10-11, down to 7.9 on admission -Continue to hold Eliquis - Colonoscopy 10/24/2021 significant for 3 subcentimeter polyp, and 110 mm polyp removed by cold snare's with 1 clip placed successfully. -GI input greatly appreciated, She went for EGD 5/20, it was unrevealing, so she had capsule study done, VCE results still pending, will await final  readings and recommendations to see when can she be resumed on Eliquis. -Receiving IV iron  B12 deficiency -continue with supplements, will lower dose given elevated level.   Falls/deconditioning -The setting of acute illness, PT/OT consulted.   Hyperlipedemia - Continue with home dose statin    Paroxysmal A-fib -Eliquis on hold due to above, -On Coreg for heart rate control   Hyponatremia - Due to volume depletion, improving with IV fluids   Hypertension  -Blood pressure soft, will keep on as needed hydralazine and hold home medications, including losartan and amlodipine, continue with Coreg due to A-fib   GERD  -She will be on IV PPI    Anxiety/depression  -Continue with home medications    Recurrent CVAs -Resume Eliquis once cleared by GI   CKD stage IIIb  -Renal function and at baseline, continue to monitor closely, avoid nephrotoxic medications      DVT prophylaxis:scd Code Status: Full Family Communication: none at bedside Disposition:   Status is: Inpatient    Consultants:  GI   Subjective:  No significant events overnight, she denies any complaints. Objective: Vitals:   07/25/22 0000 07/25/22 0400 07/25/22 0831 07/25/22 1200  BP: 104/87 (!) 148/60 130/76   Pulse: 62 63 71   Resp: 19 18 18    Temp: 97.8 F (36.6 C) 97.8 F (36.6 C) 98 F (36.7 C) 98.3 F (36.8 C)  TempSrc: Oral Oral Oral Oral  SpO2: 95% 95% 95%   Weight:      Height:        Intake/Output Summary (Last 24 hours) at 07/25/2022 1454 Last data filed  at 07/24/2022 2100 Gross per 24 hour  Intake 120 ml  Output --  Net 120 ml   Filed Weights   07/24/22 0846  Weight: 74 kg    Examination:  Awake Alert, Oriented X 3, No new F.N deficits, Normal affect Symmetrical Chest wall movement, Good air movement bilaterally, CTAB RRR,No Gallops,Rubs or new Murmurs, No Parasternal Heave +ve B.Sounds, Abd Soft, No tenderness, No rebound - guarding or rigidity. No Cyanosis, Clubbing  or edema, No new Rash or bruise        Data Reviewed: I have personally reviewed following labs and imaging studies  CBC: Recent Labs  Lab 07/19/22 1410 07/19/22 2142 07/21/22 0556 07/22/22 0335 07/23/22 0321 07/24/22 0439 07/25/22 0523  WBC 6.5   < > 3.3* 3.2* 3.5* 3.7* 4.3  NEUTROABS 4.9  --  1.9  --   --   --   --   HGB 7.9*   < > 7.5* 7.4* 7.4* 7.6* 8.0*  HCT 26.6*   < > 24.4* 24.0* 24.8* 24.9* 26.4*  MCV 78.5*   < > 78.2* 78.2* 80.0 77.1* 79.0*  PLT 132*   < > 127* 136* 151 165 170   < > = values in this interval not displayed.    Basic Metabolic Panel: Recent Labs  Lab 07/19/22 1410 07/20/22 0349 07/21/22 0556  NA 130* 131* 136  K 4.4 3.5 3.6  CL 101 101 107  CO2 18* 20* 20*  GLUCOSE 124* 103* 105*  BUN 37* 26* 13  CREATININE 1.69* 1.34* 1.09*  CALCIUM 8.2* 7.5* 7.5*  MG  --   --  1.9  PHOS  --   --  2.7    GFR: Estimated Creatinine Clearance: 45.9 mL/min (A) (by C-G formula based on SCr of 1.09 mg/dL (H)).  Liver Function Tests: Recent Labs  Lab 07/19/22 1410  AST 65*  ALT 25  ALKPHOS 33*  BILITOT 0.6  PROT 6.3*  ALBUMIN 3.2*    CBG: No results for input(s): "GLUCAP" in the last 168 hours.   Recent Results (from the past 240 hour(s))  Culture, blood (routine x 2)     Status: None   Collection Time: 07/19/22  2:21 PM   Specimen: BLOOD  Result Value Ref Range Status   Specimen Description BLOOD RIGHT ANTECUBITAL  Final   Special Requests   Final    BOTTLES DRAWN AEROBIC AND ANAEROBIC Blood Culture adequate volume   Culture   Final    NO GROWTH 5 DAYS Performed at [facility], [address] [place], [place] [address]    Report Status 07/24/2022 FINAL  Final  Urine Culture     Status: None   Collection Time: 07/19/22  2:21 PM   Specimen: Urine, Clean Catch  Result Value Ref Range Status   Specimen Description URINE, CLEAN CATCH  Final   Special Requests NONE  Final   Culture   Final    NO GROWTH Performed at [facility], [address], [place], [place] [address]    Report Status 07/21/2022 FINAL  Final  SARS Coronavirus 2 by RT PCR (hospital order, performed in [facility] hospital lab) *cepheid single result test*     Status: None   Collection Time: 07/19/22  2:21 PM   Specimen: Nasal Swab  Result Value Ref Range Status   SARS Coronavirus 2 by RT PCR NEGATIVE NEGATIVE Final    Comment: Performed at [facility], [address],  [place], [place] [address]  Culture, blood (routine x 2)     Status: None   Collection Time: 07/19/22  2:26 PM   Specimen: BLOOD LEFT FOREARM  Result Value Ref Range Status   Specimen Description BLOOD LEFT FOREARM  Final   Special Requests   Final    BOTTLES DRAWN AEROBIC ONLY Blood Culture results may not be optimal due to an inadequate volume of blood received in culture bottles   Culture   Final    NO GROWTH 5 DAYS Performed at [facility], [address] [place], [place] [address]    Report Status 07/24/2022 FINAL  Final  Respiratory (~20 pathogens) panel by PCR     Status: Abnormal   Collection Time: 07/19/22  4:34 PM   Specimen: Nasopharyngeal Swab; Respiratory  Result Value Ref Range Status   Adenovirus NOT DETECTED NOT DETECTED Final   Coronavirus 229E NOT DETECTED NOT DETECTED Final    Comment: (NOTE) The Coronavirus on the Respiratory Panel, DOES NOT test for the novel  Coronavirus (2019 nCoV)    Coronavirus HKU1 NOT DETECTED NOT DETECTED Final   Coronavirus NL63 NOT DETECTED NOT DETECTED Final   Coronavirus OC43 NOT DETECTED NOT DETECTED Final   Metapneumovirus DETECTED (A) NOT DETECTED Final   Rhinovirus / Enterovirus NOT DETECTED NOT DETECTED Final   Influenza A NOT DETECTED NOT DETECTED Final   Influenza B NOT DETECTED NOT DETECTED Final   Parainfluenza Virus 1 NOT DETECTED NOT DETECTED Final   Parainfluenza Virus 2 NOT DETECTED NOT DETECTED Final   Parainfluenza Virus 3 NOT DETECTED NOT DETECTED Final   Parainfluenza Virus 4 NOT  DETECTED NOT DETECTED Final   Respiratory Syncytial Virus NOT DETECTED NOT DETECTED Final   Bordetella pertussis NOT DETECTED NOT DETECTED Final   Bordetella Parapertussis NOT DETECTED NOT DETECTED Final   Chlamydophila pneumoniae NOT DETECTED NOT DETECTED Final   Mycoplasma pneumoniae NOT DETECTED NOT DETECTED Final    Comment: Performed at [facility], [address] [place], [place] [address]  C Difficile Quick Screen w PCR reflex     Status: None   Collection Time: 07/22/22  4:00 AM   Specimen: STOOL  Result Value Ref Range Status   C Diff antigen NEGATIVE NEGATIVE Final   C Diff toxin NEGATIVE NEGATIVE Final   C Diff interpretation No C. difficile detected.  Final    Comment: Performed at [facility], [address], [place], [place] [address]         Radiology Studies: No results found.      Scheduled Meds:  atorvastatin  80 mg Oral QPM   azithromycin  500 mg Oral Daily   buPROPion  300 mg Oral Daily   carvedilol  3.125 mg Oral BID   chlorpheniramine-HYDROcodone  5 mL Oral Q12H   cyanocobalamin  500 mcg Oral Daily   gabapentin  100 mg Oral BID   pantoprazole  40 mg Oral BID AC   Continuous Infusions:     LOS: 6 days        Huey Bienenstock, MD [facility]   To contact the attending provider between 7A-7P or the covering provider during after hours 7P-7A, please log into the web site [email or web address] and access using universal [place] password for that web site. If you do not have the password, please call the hospital operator.  07/25/2022, 2:54 PM

## 2022-07-25 NOTE — Progress Notes (Signed)
      Progress Note   Subjective  Tolerating regular diet. No further bleeding symptoms. Capsule placed yesterday she tolerated it well. Has not seen it pass in her stool.    Objective   Vital signs in last 24 hours: Temp:  [97.7 F (36.5 C)-98.3 F (36.8 C)] 98.3 F (36.8 C) (05/21 1200) Pulse Rate:  [62-73] 71 (05/21 0831) Resp:  [18-19] 18 (05/21 0831) BP: (104-174)/(56-87) 130/76 (05/21 0831) SpO2:  [95 %-97 %] 95 % (05/21 0831) Last BM Date : 07/23/22 General:    white female in NAD Neurologic:  Alert and oriented,  grossly normal neurologically. Psych:  Cooperative. Normal mood and affect.  Intake/Output from previous day: 05/20 0701 - 05/21 0700 In: 963.8 [P.O.:120; I.V.:843.8] Out: 1600 [Urine:1600] Intake/Output this shift: No intake/output data recorded.  Lab Results: Recent Labs    07/23/22 0321 07/24/22 0439 07/25/22 0523  WBC 3.5* 3.7* 4.3  HGB 7.4* 7.6* 8.0*  HCT 24.8* 24.9* 26.4*  PLT 151 165 170   BMET No results for input(s): "NA", "K", "CL", "CO2", "GLUCOSE", "BUN", "CREATININE", "CALCIUM" in the last 72 hours. LFT No results for input(s): "PROT", "ALBUMIN", "AST", "ALT", "ALKPHOS", "BILITOT", "BILIDIR", "IBILI" in the last 72 hours. PT/INR No results for input(s): "LABPROT", "INR" in the last 72 hours.  Studies/Results: No results found.     Assessment / Plan:    77 y/o female here with the following:  Dark heme positive stools Acute on chronic anemia Iron deficiency Anticoagulated (Eliquis)  EGD done yesterday to evaluate this as this appeared to most likely be an upper GI bleed (BUN elevation has since normalized). EGD was normal, no clear cause for her symptoms on that exam. Her last colonoscopy was < 1 year ago, done 10/2021 with some polyps removed. We placed a capsule to evaluate her small bowel post EGD. I tried to read this tonight but there is a software issue with the program for this technology and I can't open the study. I  hope to be able to read this and get a report for her tomorrow. In the interim she has done well, no further bleeding off Eliquis, Hgb stable, BUN normalized. Will see if capsule shows [place] pathology to account for her symptoms. I think yield of a colonoscopy will be low but may consider it if her capsule is negative without otherwise clear cause for her symptoms.  Tolerating diet for now, will have an update for her tomorrow. Continue to hold Eliquis for now. Call with questions.  Harlin Rain, MD [facility]

## 2022-07-26 DIAGNOSIS — Z7901 Long term (current) use of anticoagulants: Secondary | ICD-10-CM | POA: Diagnosis not present

## 2022-07-26 DIAGNOSIS — J189 Pneumonia, unspecified organism: Secondary | ICD-10-CM | POA: Diagnosis not present

## 2022-07-26 DIAGNOSIS — I10 Essential (primary) hypertension: Secondary | ICD-10-CM

## 2022-07-26 DIAGNOSIS — E611 Iron deficiency: Secondary | ICD-10-CM | POA: Diagnosis not present

## 2022-07-26 DIAGNOSIS — D509 Iron deficiency anemia, unspecified: Secondary | ICD-10-CM

## 2022-07-26 DIAGNOSIS — K5520 Angiodysplasia of colon without hemorrhage: Secondary | ICD-10-CM

## 2022-07-26 DIAGNOSIS — D62 Acute posthemorrhagic anemia: Secondary | ICD-10-CM | POA: Diagnosis not present

## 2022-07-26 DIAGNOSIS — K219 Gastro-esophageal reflux disease without esophagitis: Secondary | ICD-10-CM | POA: Diagnosis not present

## 2022-07-26 DIAGNOSIS — K921 Melena: Secondary | ICD-10-CM | POA: Diagnosis not present

## 2022-07-26 LAB — CBC
HCT: 32.4 % — ABNORMAL LOW (ref 36.0–46.0)
Hemoglobin: 9.9 g/dL — ABNORMAL LOW (ref 12.0–15.0)
MCH: 24 pg — ABNORMAL LOW (ref 26.0–34.0)
MCHC: 30.6 g/dL (ref 30.0–36.0)
MCV: 78.6 fL — ABNORMAL LOW (ref 80.0–100.0)
Platelets: 271 10*3/uL (ref 150–400)
RBC: 4.12 MIL/uL (ref 3.87–5.11)
RDW: 17.9 % — ABNORMAL HIGH (ref 11.5–15.5)
WBC: 6 10*3/uL (ref 4.0–10.5)
nRBC: 0 % (ref 0.0–0.2)

## 2022-07-26 MED ORDER — PEG-KCL-NACL-NASULF-NA ASC-C 100 G PO SOLR
0.5000 | Freq: Once | ORAL | Status: AC
  Administered 2022-07-27: 100 g via ORAL

## 2022-07-26 MED ORDER — PEG-KCL-NACL-NASULF-NA ASC-C 100 G PO SOLR: 0.5000 | Freq: Once | ORAL | Status: DC

## 2022-07-26 MED ORDER — PEG-KCL-NACL-NASULF-NA ASC-C 100 G PO SOLR
0.5000 | Freq: Once | ORAL | Status: AC
  Administered 2022-07-26: 100 g via ORAL
  Filled 2022-07-26: qty 1

## 2022-07-26 NOTE — H&P (View-Only) (Signed)
      Progress Note   Subjective  No further bleeding symptoms. Capsule study read this AM. Hgb stable   Objective   Vital signs in last 24 hours: Temp:  [98.1 F (36.7 C)-98.9 F (37.2 C)] 98.1 F (36.7 C) (05/22 0800) Pulse Rate:  [63-75] 75 (05/22 0800) Resp:  [17-20] 20 (05/22 1209) BP: (106-173)/(58-76) 130/59 (05/22 1209) SpO2:  [93 %-98 %] 94 % (05/22 1209) Last BM Date : 07/23/22 (per pt report) General:    white female in NAD Neurologic:  Alert and oriented,  grossly normal neurologically. Psych:  Cooperative. Normal mood and affect.  Intake/Output from previous day: 05/21 0701 - 05/22 0700 In: 60 [P.O.:60] Out: 900 [Urine:900] Intake/Output this shift: Total I/O In: 240 [P.O.:240] Out: 400 [Urine:400]  Lab Results: Recent Labs    07/24/22 0439 07/25/22 0523 07/26/22 0820  WBC 3.7* 4.3 6.0  HGB 7.6* 8.0* 9.9*  HCT 24.9* 26.4* 32.4*  PLT 165 170 271   BMET No results for input(s): "NA", "K", "CL", "CO2", "GLUCOSE", "BUN", "CREATININE", "CALCIUM" in the last 72 hours. LFT No results for input(s): "PROT", "ALBUMIN", "AST", "ALT", "ALKPHOS", "BILITOT", "BILIDIR", "IBILI" in the last 72 hours. PT/INR No results for input(s): "LABPROT", "INR" in the last 72 hours.  Studies/Results: No results found.     Assessment / Plan:    77 y/o female here with the following:   Dark heme positive stools Acute on chronic anemia Iron deficiency Anticoagulated (Eliquis)   EGD done initially to evaluate this and was normal, no clear cause for her symptoms on that exam. We placed a capsule to evaluate her small bowel post EGD. Due to computer software issues I was not able to read it until this morning. There is rapid small bowel transit. NO blood or heme anywhere noted. In the proximal small bowel there appears to be a lesion that either represents the ampulla or an ulcer. Given rapid bowel transit it is hard to place this in her bowel - it very well could be the  ampulla but it is unclear.  Otherwise I don't have a clear source for her symptoms / anemia based on these results yet. Could be a small bowel ulcer given capsule findings but I am not confident enough in the study to put her back on a blood thinner without further evaluation. Discussed at length with the patient - offering colonoscopy to exclude a lower tract source and can do enteroscopy to re-evaluate her small bowel and see if we can clarify what was seen on the capsule test. I discussed these exams with her, risks benefits of them and anesthesia, she wishes to proceed. Clear liquid diet today, prep tonight, procedures tomorrow. Continue to hold Eliquis.  PLAN: - clear liquid diet today - bowel prep this evening - colonoscopy and enteroscopy tomorrow - holding Eliquis for now  - call with questions or any recurrent bleeding  Harlin Rain, MD [facility]

## 2022-07-26 NOTE — Progress Notes (Signed)
      Progress Note   Subjective  No further bleeding symptoms. Capsule study read this AM. Hgb stable   Objective   Vital signs in last 24 hours: Temp:  [98.1 F (36.7 C)-98.9 F (37.2 C)] 98.1 F (36.7 C) (05/22 0800) Pulse Rate:  [63-75] 75 (05/22 0800) Resp:  [17-20] 20 (05/22 1209) BP: (106-173)/(58-76) 130/59 (05/22 1209) SpO2:  [93 %-98 %] 94 % (05/22 1209) Last BM Date : 07/23/22 (per pt report) General:    white female in NAD Neurologic:  Alert and oriented,  grossly normal neurologically. Psych:  Cooperative. Normal mood and affect.  Intake/Output from previous day: 05/21 0701 - 05/22 0700 In: 60 [P.O.:60] Out: 900 [Urine:900] Intake/Output this shift: Total I/O In: 240 [P.O.:240] Out: 400 [Urine:400]  Lab Results: Recent Labs    07/24/22 0439 07/25/22 0523 07/26/22 0820  WBC 3.7* 4.3 6.0  HGB 7.6* 8.0* 9.9*  HCT 24.9* 26.4* 32.4*  PLT 165 170 271   BMET No results for input(s): "NA", "K", "CL", "CO2", "GLUCOSE", "BUN", "CREATININE", "CALCIUM" in the last 72 hours. LFT No results for input(s): "PROT", "ALBUMIN", "AST", "ALT", "ALKPHOS", "BILITOT", "BILIDIR", "IBILI" in the last 72 hours. PT/INR No results for input(s): "LABPROT", "INR" in the last 72 hours.  Studies/Results: No results found.     Assessment / Plan:    76 y/o female here with the following:   Dark heme positive stools Acute on chronic anemia Iron deficiency Anticoagulated (Eliquis)   EGD done initially to evaluate this and was normal, no clear cause for her symptoms on that exam. We placed a capsule to evaluate her small bowel post EGD. Due to computer software issues I was not able to read it until this morning. There is rapid small bowel transit. NO blood or heme anywhere noted. In the proximal small bowel there appears to be a lesion that either represents the ampulla or an ulcer. Given rapid bowel transit it is hard to place this in her bowel - it very well could be the  ampulla but it is unclear.  Otherwise I don't have a clear source for her symptoms / anemia based on these results yet. Could be a small bowel ulcer given capsule findings but I am not confident enough in the study to put her back on a blood thinner without further evaluation. Discussed at length with the patient - offering colonoscopy to exclude a lower tract source and can do enteroscopy to re-evaluate her small bowel and see if we can clarify what was seen on the capsule test. I discussed these exams with her, risks benefits of them and anesthesia, she wishes to proceed. Clear liquid diet today, prep tonight, procedures tomorrow. Continue to hold Eliquis.  PLAN: - clear liquid diet today - bowel prep this evening - colonoscopy and enteroscopy tomorrow - holding Eliquis for now  - call with questions or any recurrent bleeding  Steve Larico Dimock, MD [facility]  

## 2022-07-26 NOTE — Progress Notes (Signed)
Physical Therapy Treatment Patient Details Name: Ariel [name] MRN: [id number] DOB: 1945/11/04 Today's Date: 07/26/2022   History of Present Illness Patient is 77 y.o. female who presented 07/19/22 to ED with cough, shortness of breath, and dark stools. ED workup significant for dehydration, sodium of 130, chest x-ray significant for LLL opacity, hemoglobin low at 7.9, down from 10.1 this March, colonoscopy last October at [facility] significant for polyps, patient reports she never had endoscopy before, she denies any NSAIDs use, she was Hemoccult positive. Admitted for PNA and GIB. PMH significant for HTN, Stroke.    PT Comments    Pt was seen for mobility in her room to work on balance control of RW with confined spaces and obstacles.  Pt is aware of her challenges, and works on finding a solution for every path issue.  Have recommended her to have HHPT as adjunct to follow up medical care due to her residual weakness and balance loss from disuse and medical complications of Gi bleed.  Follow along with her for continued recovery of functional mobility and safety.  Recommendations for follow up therapy are one component of a multi-disciplinary discharge planning process, led by the attending physician.  Recommendations may be updated based on patient status, additional functional criteria and insurance authorization.  Follow Up Recommendations       Assistance Recommended at Discharge Frequent or constant Supervision/Assistance  Patient can return home with the following A lot of help with walking and/or transfers;A little help with bathing/dressing/bathroom;Assistance with cooking/housework;Direct supervision/assist for medications management;Assist for transportation;Help with stairs or ramp for entrance   Equipment Recommendations  None recommended by PT    Recommendations for Other Services       Precautions / Restrictions Precautions Precautions: Fall Precaution Comments: watch  O2 Restrictions Weight Bearing Restrictions: No     Mobility  Bed Mobility Overal bed mobility: Needs Assistance Bed Mobility: Sit to Supine       Sit to supine: Min assist   General bed mobility comments: up in chair when PT arrived    Transfers Overall transfer level: Needs assistance Equipment used: Rolling walker (2 wheels) Transfers: Sit to/from Stand, Bed to chair/wheelchair/BSC Sit to Stand: Min guard, Min assist   Step pivot transfers: Min guard            Ambulation/Gait Ambulation/Gait assistance: Editor, commissioning (Feet): 35 Feet Assistive device: Rolling walker (2 wheels) Gait Pattern/deviations: Step-through pattern, Decreased stride length, Decreased dorsiflexion - right, Decreased dorsiflexion - left, Shuffle Gait velocity: reduced Gait velocity interpretation: <1.31 ft/sec, indicative of household ambulator Pre-gait activities: standing balance ck General Gait Details: min assist to maneuver walker over confined spaces, understands how to navigate narrow walkway   Stairs             Wheelchair Mobility    Modified Rankin (Stroke Patients Only)       Balance Overall balance assessment: Needs assistance Sitting-balance support: Feet supported Sitting balance-Leahy Scale: Fair Sitting balance - Comments: up in chair and very controlled with posture                                    Cognition Arousal/Alertness: Awake/alert Behavior During Therapy: WFL for tasks assessed/performed Overall Cognitive Status: Within Functional Limits for tasks assessed  Exercises      General Comments General comments (skin integrity, edema, etc.): Pt was assisted to stand and walk then to get to bed with help.  Has been up in chair all afternoon, very much motivated to walk and use support to balance      Pertinent Vitals/Pain Pain Assessment Pain Assessment:  Faces Faces Pain Scale: No hurt    Home Living                          Prior Function            PT Goals (current goals can now be found in the care plan section) Acute Rehab PT Goals Patient Stated Goal: get stronger and home, stop falling Progress towards PT goals: Progressing toward goals    Frequency    Min 3X/week      PT Plan Current plan remains appropriate    Co-evaluation              AM-PAC PT "6 Clicks" Mobility   Outcome Measure  Help needed turning from your back to your side while in a flat bed without using bedrails?: A Little Help needed moving from lying on your back to sitting on the side of a flat bed without using bedrails?: A Little Help needed moving to and from a bed to a chair (including a wheelchair)?: A Little Help needed standing up from a chair using your arms (e.g., wheelchair or bedside chair)?: A Little Help needed to walk in hospital room?: A Little Help needed climbing 3-5 steps with a railing? : Total 6 Click Score: 16    End of Session Equipment Utilized During Treatment: Gait belt Activity Tolerance: Patient limited by fatigue;Treatment limited secondary to medical complications (Comment) Patient left: in bed;with call bell/phone within reach;with bed alarm set Nurse Communication: Mobility status PT Visit Diagnosis: Unsteadiness on feet (R26.81);Muscle weakness (generalized) (M62.81);Other abnormalities of gait and mobility (R26.89);Difficulty in walking, not elsewhere classified (R26.2)     Time: 1610-9604 PT Time Calculation (min) (ACUTE ONLY): 23 min  Charges:  $Gait Training: 8-22 mins $Therapeutic Activity: 8-22 mins          Ivar Drape 07/26/2022, 5:22 PM  Samul Dada, PT PhD Acute Rehab Dept. Number: [facility] [id number] and [facility] [phone]

## 2022-07-26 NOTE — Progress Notes (Signed)
PROGRESS NOTE        PATIENT DETAILS Name: Ariel [name] Age: 77 y.o. Sex: female Date of Birth: 1945/07/13 Admit Date: 07/19/2022 Admitting Physician Starleen Arms, MD [id number], [name], MD  Brief Summary: Patient is a 77 y.o.  female with prior history of CVA with residual left-sided weakness, HTN, HLD, CKD stage IIIb, PAF on Eliquis-who presented with shortness of breath/melanotic stools-patient was found to have Meta pneumonia virus related PNA and possible upper GI bleeding with acute blood loss anemia.  Significant events: 5/15>> admit to [facility]  Significant studies: 5/15>> CXR: Patchy LLL infiltrate.  Significant microbiology data: 5/15>> blood culture: No growth 5/15>> urine culture: No growth 5/15>> respiratory virus panel: Meta pneumonia virus 5/15>> COVID PCR: Negative 5/18>> stool C. difficile PCR: Negative  Procedures: 5/20>> EGD  Consults: GI  Subjective: Lying comfortably in bed-denies any chest pain or shortness of breath.  Objective: Vitals: Blood pressure (!) 130/59, pulse 75, temperature 98.1 F (36.7 C), temperature source Oral, resp. rate 20, height 5\' 6"  (1.676 m), weight 74 kg, SpO2 94 %.   Exam: Gen Exam:Alert awake-not in any distress HEENT:atraumatic, normocephalic Chest: B/L clear to auscultation anteriorly CVS:S1S2 regular Abdomen:soft non tender, non distended Extremities:no edema Skin: no rash  Pertinent Labs/Radiology:    Latest Ref Rng & Units 07/26/2022    8:20 AM 07/25/2022    5:23 AM 07/24/2022    4:39 AM  CBC  WBC 4.0 - 10.5 K/uL 6.0  4.3  3.7   Hemoglobin 12.0 - 15.0 g/dL 9.9  8.0  7.6   Hematocrit 36.0 - 46.0 % 32.4  26.4  24.9   Platelets 150 - 400 K/uL 271  170  165     Lab Results  Component Value Date   NA 136 07/21/2022   K 3.6 07/21/2022   CL 107 07/21/2022   CO2 20 (L) 07/21/2022      Assessment/Plan: Left lobar PNA Clinically improved Afebrile Leukopenia has  resolved Completed a course of antibiotics  Upper GI bleeding Acute blood loss anemia EGD/capsule endoscopy nondiagnostic Colonoscopy/small bowel enteroscopy planned for tomorrow. Hb now stable-continue to follow CBC   Vitamin B12 deficiency Continue supplementation  AKI on CKD stage IIIb Hemodynamically mediated Resolved-creatinine back to baseline  PAF Sinus rhythm Coreg Eliquis on hold due to GI bleeding  Recurrent CVA Plan is to resume Eliquis when GI/endoscopic workup has been completed.  HLD Statin  HTN BP stable on Coreg Amlodipine being/losartan on hold-resume when able  GERD PPI  Mood disorder Relatively stable BuSpar/Wellbutrin  BMI: Estimated body mass index is 26.33 kg/m as calculated from the following:   Height as of this encounter: 5\' 6"  (1.676 m).   Weight as of this encounter: 74 kg.   Code status:   Code Status: Full Code   DVT Prophylaxis: SCDs Start: 07/19/22 1721   Family Communication: None at bedside  Disposition Plan: Status is: Inpatient Remains inpatient appropriate because: Severity of illness   Planned Discharge Destination:Home   Diet: Diet Order             Diet clear liquid Fluid consistency: Thin  Diet effective now                     Antimicrobial agents: Anti-infectives (From admission, onward)    Start     Dose/Rate Route  Frequency Ordered Stop   07/20/22 1500  cefTRIAXone (ROCEPHIN) 2 g in sodium chloride 0.9 % 100 mL IVPB        2 g 200 mL/hr over 30 Minutes Intravenous Every 24 hours 07/19/22 1720 07/24/22 1543   07/19/22 1630  cefTRIAXone (ROCEPHIN) 1 g in sodium chloride 0.9 % 100 mL IVPB        1 g 200 mL/hr over 30 Minutes Intravenous  Once 07/19/22 1616 07/19/22 1827   07/19/22 1630  azithromycin (ZITHROMAX) tablet 500 mg  Status:  Discontinued        500 mg Oral Daily 07/19/22 1616 07/25/22 1455        MEDICATIONS: Scheduled Meds:  atorvastatin  80 mg Oral QPM   buPROPion  300 mg  Oral Daily   carvedilol  3.125 mg Oral BID   chlorpheniramine-HYDROcodone  5 mL Oral Q12H   cyanocobalamin  500 mcg Oral Daily   gabapentin  100 mg Oral BID   pantoprazole  40 mg Oral BID AC   Continuous Infusions: PRN Meds:.acetaminophen **OR** acetaminophen, albuterol, benzonatate, busPIRone, hydrALAZINE, HYDROcodone-acetaminophen   I have personally reviewed following labs and imaging studies  LABORATORY DATA: CBC: Recent Labs  Lab 07/19/22 1410 07/19/22 2142 07/21/22 0556 07/22/22 0335 07/23/22 0321 07/24/22 0439 07/25/22 0523 07/26/22 0820  WBC 6.5   < > 3.3* 3.2* 3.5* 3.7* 4.3 6.0  NEUTROABS 4.9  --  1.9  --   --   --   --   --   HGB 7.9*   < > 7.5* 7.4* 7.4* 7.6* 8.0* 9.9*  HCT 26.6*   < > 24.4* 24.0* 24.8* 24.9* 26.4* 32.4*  MCV 78.5*   < > 78.2* 78.2* 80.0 77.1* 79.0* 78.6*  PLT 132*   < > 127* 136* 151 165 170 271   < > = values in this interval not displayed.    Basic Metabolic Panel: Recent Labs  Lab 07/19/22 1410 07/20/22 0349 07/21/22 0556  NA 130* 131* 136  K 4.4 3.5 3.6  CL 101 101 107  CO2 18* 20* 20*  GLUCOSE 124* 103* 105*  BUN 37* 26* 13  CREATININE 1.69* 1.34* 1.09*  CALCIUM 8.2* 7.5* 7.5*  MG  --   --  1.9  PHOS  --   --  2.7    GFR: Estimated Creatinine Clearance: 45.9 mL/min (A) (by C-G formula based on SCr of 1.09 mg/dL (H)).  Liver Function Tests: Recent Labs  Lab 07/19/22 1410  AST 65*  ALT 25  ALKPHOS 33*  BILITOT 0.6  PROT 6.3*  ALBUMIN 3.2*   Recent Labs  Lab 07/19/22 1410  LIPASE 26   No results for input(s): "AMMONIA" in the last 168 hours.  Coagulation Profile: Recent Labs  Lab 07/19/22 1410  INR 1.5*    Cardiac Enzymes: No results for input(s): "CKTOTAL", "CKMB", "CKMBINDEX", "TROPONINI" in the last 168 hours.  BNP (last 3 results) No results for input(s): "PROBNP" in the last 8760 hours.  Lipid Profile: No results for input(s): "CHOL", "HDL", "LDLCALC", "TRIG", "CHOLHDL", "LDLDIRECT" in the last  72 hours.  Thyroid Function Tests: No results for input(s): "TSH", "T4TOTAL", "FREET4", "T3FREE", "THYROIDAB" in the last 72 hours.  Anemia Panel: No results for input(s): "VITAMINB12", "FOLATE", "FERRITIN", "TIBC", "IRON", "RETICCTPCT" in the last 72 hours.  Urine analysis:    Component Value Date/Time   COLORURINE YELLOW 07/20/2022 0548   APPEARANCEUR CLEAR 07/20/2022 0548   LABSPEC 1.014 07/20/2022 0548   PHURINE 5.0 07/20/2022 0548  GLUCOSEU NEGATIVE 07/20/2022 0548   HGBUR MODERATE (A) 07/20/2022 0548   BILIRUBINUR NEGATIVE 07/20/2022 0548   KETONESUR 5 (A) 07/20/2022 0548   PROTEINUR NEGATIVE 07/20/2022 0548   NITRITE NEGATIVE 07/20/2022 0548   LEUKOCYTESUR NEGATIVE 07/20/2022 0548    Sepsis Labs: Lactic Acid, Venous    Component Value Date/Time   LATICACIDVEN 1.5 07/19/2022 1600    MICROBIOLOGY: Recent Results (from the past 240 hour(s))  Culture, blood (routine x 2)     Status: None   Collection Time: 07/19/22  2:21 PM   Specimen: BLOOD  Result Value Ref Range Status   Specimen Description BLOOD RIGHT ANTECUBITAL  Final   Special Requests   Final    BOTTLES DRAWN AEROBIC AND ANAEROBIC Blood Culture adequate volume   Culture   Final    NO GROWTH 5 DAYS Performed at [facility], [address], [place], [place] [address]    Report Status 07/24/2022 FINAL  Final  Urine Culture     Status: None   Collection Time: 07/19/22  2:21 PM   Specimen: Urine, Clean Catch  Result Value Ref Range Status   Specimen Description URINE, CLEAN CATCH  Final   Special Requests NONE  Final   Culture   Final    NO GROWTH Performed at [facility], [address], [place], [place] [address]    Report Status 07/21/2022 FINAL  Final  SARS Coronavirus 2 by RT PCR (hospital order, performed in [facility] hospital lab) *cepheid single result test*     Status: None   Collection Time: 07/19/22  2:21 PM   Specimen: Nasal Swab  Result Value Ref Range Status   SARS  Coronavirus 2 by RT PCR NEGATIVE NEGATIVE Final    Comment: Performed at [facility], [address] [place], [place] [address]  Culture, blood (routine x 2)     Status: None   Collection Time: 07/19/22  2:26 PM   Specimen: BLOOD LEFT FOREARM  Result Value Ref Range Status   Specimen Description BLOOD LEFT FOREARM  Final   Special Requests   Final    BOTTLES DRAWN AEROBIC ONLY Blood Culture results may not be optimal due to an inadequate volume of blood received in culture bottles   Culture   Final    NO GROWTH 5 DAYS Performed at [facility], [address], [place], [place] [address]    Report Status 07/24/2022 FINAL  Final  Respiratory (~20 pathogens) panel by PCR     Status: Abnormal   Collection Time: 07/19/22  4:34 PM   Specimen: Nasopharyngeal Swab; Respiratory  Result Value Ref Range Status   Adenovirus NOT DETECTED NOT DETECTED Final   Coronavirus 229E NOT DETECTED NOT DETECTED Final    Comment: (NOTE) The Coronavirus on the Respiratory Panel, DOES NOT test for the novel  Coronavirus (2019 nCoV)    Coronavirus HKU1 NOT DETECTED NOT DETECTED Final   Coronavirus NL63 NOT DETECTED NOT DETECTED Final   Coronavirus OC43 NOT DETECTED NOT DETECTED Final   Metapneumovirus DETECTED (A) NOT DETECTED Final   Rhinovirus / Enterovirus NOT DETECTED NOT DETECTED Final   Influenza A NOT DETECTED NOT DETECTED Final   Influenza B NOT DETECTED NOT DETECTED Final   Parainfluenza Virus 1 NOT DETECTED NOT DETECTED Final   Parainfluenza Virus 2 NOT DETECTED NOT DETECTED Final   Parainfluenza Virus 3 NOT DETECTED NOT DETECTED Final   Parainfluenza Virus 4 NOT DETECTED NOT DETECTED Final  Respiratory Syncytial Virus NOT DETECTED NOT DETECTED Final   Bordetella pertussis NOT DETECTED NOT DETECTED Final   Bordetella Parapertussis NOT DETECTED NOT DETECTED Final   Chlamydophila pneumoniae NOT DETECTED NOT DETECTED Final   Mycoplasma pneumoniae NOT DETECTED NOT DETECTED Final     Comment: Performed at [facility], [address], [place], [place] [address]  C Difficile Quick Screen w PCR reflex     Status: None   Collection Time: 07/22/22  4:00 AM   Specimen: STOOL  Result Value Ref Range Status   C Diff antigen NEGATIVE NEGATIVE Final   C Diff toxin NEGATIVE NEGATIVE Final   C Diff interpretation No C. difficile detected.  Final    Comment: Performed at [facility], [address], [place], [place] [address]    RADIOLOGY STUDIES/RESULTS: No results found.   LOS: 7 days   Jeoffrey Massed, MD  [facility]    To contact the attending provider between 7A-7P or the covering provider during after hours 7P-7A, please log into the web site [email or web address] and access using universal [place] password for that web site. If you do not have the password, please call the hospital operator.  07/26/2022, 12:51 PM

## 2022-07-26 NOTE — Plan of Care (Signed)
  Problem: Activity: Goal: Ability to tolerate increased activity will improve Outcome: Progressing   Problem: Clinical Measurements: Goal: Ability to maintain a body temperature in the normal range will improve Outcome: Progressing   

## 2022-07-27 ENCOUNTER — Inpatient Hospital Stay (HOSPITAL_COMMUNITY): Payer: Medicare HMO | Admitting: Anesthesiology

## 2022-07-27 ENCOUNTER — Encounter (HOSPITAL_COMMUNITY)
Admission: EM | Disposition: A | Discharge status: Home / Self Care | Payer: Self-pay | Source: Home / Self Care | Attending: Internal Medicine

## 2022-07-27 ENCOUNTER — Encounter (HOSPITAL_COMMUNITY): Payer: Self-pay | Admitting: Internal Medicine

## 2022-07-27 DIAGNOSIS — K648 Other hemorrhoids: Secondary | ICD-10-CM

## 2022-07-27 DIAGNOSIS — D631 Anemia in chronic kidney disease: Secondary | ICD-10-CM

## 2022-07-27 DIAGNOSIS — J189 Pneumonia, unspecified organism: Secondary | ICD-10-CM | POA: Diagnosis not present

## 2022-07-27 DIAGNOSIS — D126 Benign neoplasm of colon, unspecified: Secondary | ICD-10-CM | POA: Diagnosis not present

## 2022-07-27 DIAGNOSIS — K317 Polyp of stomach and duodenum: Secondary | ICD-10-CM | POA: Diagnosis not present

## 2022-07-27 DIAGNOSIS — D62 Acute posthemorrhagic anemia: Secondary | ICD-10-CM | POA: Diagnosis not present

## 2022-07-27 DIAGNOSIS — K219 Gastro-esophageal reflux disease without esophagitis: Secondary | ICD-10-CM | POA: Diagnosis not present

## 2022-07-27 DIAGNOSIS — K449 Diaphragmatic hernia without obstruction or gangrene: Secondary | ICD-10-CM | POA: Diagnosis not present

## 2022-07-27 DIAGNOSIS — K922 Gastrointestinal hemorrhage, unspecified: Secondary | ICD-10-CM | POA: Diagnosis not present

## 2022-07-27 DIAGNOSIS — N189 Chronic kidney disease, unspecified: Secondary | ICD-10-CM

## 2022-07-27 DIAGNOSIS — D123 Benign neoplasm of transverse colon: Secondary | ICD-10-CM

## 2022-07-27 DIAGNOSIS — I10 Essential (primary) hypertension: Secondary | ICD-10-CM | POA: Diagnosis not present

## 2022-07-27 DIAGNOSIS — I129 Hypertensive chronic kidney disease with stage 1 through stage 4 chronic kidney disease, or unspecified chronic kidney disease: Secondary | ICD-10-CM

## 2022-07-27 HISTORY — PX: POLYPECTOMY: SHX5525

## 2022-07-27 HISTORY — PX: ENTEROSCOPY: SHX5533

## 2022-07-27 HISTORY — PX: COLONOSCOPY WITH PROPOFOL: SHX5780

## 2022-07-27 LAB — CBC
HCT: 37.7 % (ref 36.0–46.0)
Hemoglobin: 11.4 g/dL — ABNORMAL LOW (ref 12.0–15.0)
MCH: 24 pg — ABNORMAL LOW (ref 26.0–34.0)
MCHC: 30.2 g/dL (ref 30.0–36.0)
MCV: 79.4 fL — ABNORMAL LOW (ref 80.0–100.0)
Platelets: 202 10*3/uL (ref 150–400)
RBC: 4.75 MIL/uL (ref 3.87–5.11)
RDW: 19.4 % — ABNORMAL HIGH (ref 11.5–15.5)
WBC: 4.9 10*3/uL (ref 4.0–10.5)
nRBC: 0 % (ref 0.0–0.2)

## 2022-07-27 LAB — BASIC METABOLIC PANEL
Anion gap: 12 (ref 5–15)
BUN: 7 mg/dL — ABNORMAL LOW (ref 8–23)
CO2: 25 mmol/L (ref 22–32)
Calcium: 8.8 mg/dL — ABNORMAL LOW (ref 8.9–10.3)
Chloride: 102 mmol/L (ref 98–111)
Creatinine, Ser: 0.91 mg/dL (ref 0.44–1.00)
GFR, Estimated: >60 mL/min (ref >60)
Glucose, Bld: 107 mg/dL — ABNORMAL HIGH (ref 70–99)
Potassium: 3.7 mmol/L (ref 3.5–5.1)
Sodium: 139 mmol/L (ref 135–145)

## 2022-07-27 SURGERY — COLONOSCOPY WITH PROPOFOL
Anesthesia: Monitor Anesthesia Care

## 2022-07-27 MED ORDER — LIDOCAINE 2% (20 MG/ML) 5 ML SYRINGE
INTRAMUSCULAR | Status: DC | PRN
  Administered 2022-07-27 (×2): 20 mg via INTRAVENOUS

## 2022-07-27 MED ORDER — SODIUM CHLORIDE 0.9 % IV SOLN: INTRAVENOUS | Status: DC | PRN

## 2022-07-27 MED ORDER — PROPOFOL 10 MG/ML IV BOLUS
INTRAVENOUS | Status: DC | PRN
  Administered 2022-07-27 (×2): 20 mg via INTRAVENOUS

## 2022-07-27 MED ORDER — PROPOFOL 500 MG/50ML IV EMUL
INTRAVENOUS | Status: DC | PRN
  Administered 2022-07-27: 100 ug/kg/min via INTRAVENOUS

## 2022-07-27 MED ORDER — LACTATED RINGERS IV SOLN: INTRAVENOUS | Status: DC

## 2022-07-27 SURGICAL SUPPLY — 22 items

## 2022-07-27 NOTE — Progress Notes (Addendum)
PROGRESS NOTE        PATIENT DETAILS Name: Ariel [name] Age: 77 y.o. Sex: female Date of Birth: December 08, 1945 Admit Date: 07/19/2022 Admitting Physician Starleen Arms, MD [id number], [name], MD  Brief Summary: Patient is a 77 y.o.  female with prior history of CVA with residual left-sided weakness, HTN, HLD, CKD stage IIIb, PAF on Eliquis-who presented with shortness of breath/melanotic stools-patient was found to have Meta pneumonia virus related PNA and possible upper GI bleeding with acute blood loss anemia.  Significant events: 5/15>> admit to [facility]  Significant studies: 5/15>> CXR: Patchy LLL infiltrate.  Significant microbiology data: 5/15>> blood culture: No growth 5/15>> urine culture: No growth 5/15>> respiratory virus panel: Meta pneumonia virus 5/15>> COVID PCR: Negative 5/18>> stool C. difficile PCR: Negative  Procedures: 5/20>> EGD  Consults: GI  Subjective: No major issues overnight-getting EGD/colonoscopy later today.  Objective: Vitals: Blood pressure (!) 145/59, pulse 73, temperature 98.3 F (36.8 C), resp. rate 20, height 5\' 6"  (1.676 m), weight 72.6 kg, SpO2 96 %.   Exam: Gen Exam:Alert awake-not in any distress HEENT:atraumatic, normocephalic Chest: B/L clear to auscultation anteriorly CVS:S1S2 regular Abdomen:soft non tender, non distended Extremities:no edema Neurology: Non focal Skin: no rash  Pertinent Labs/Radiology:    Latest Ref Rng & Units 07/27/2022    4:07 AM 07/26/2022    8:20 AM 07/25/2022    5:23 AM  CBC  WBC 4.0 - 10.5 K/uL 4.9  6.0  4.3   Hemoglobin 12.0 - 15.0 g/dL 09.8  9.9  8.0   Hematocrit 36.0 - 46.0 % 37.7  32.4  26.4   Platelets 150 - 400 K/uL 202  271  170     Lab Results  Component Value Date   NA 139 07/27/2022   K 3.7 07/27/2022   CL 102 07/27/2022   CO2 25 07/27/2022      Assessment/Plan: Left lobar PNA Clinically improved Afebrile Leukopenia has resolved Completed a  course of antibiotics  Upper GI bleeding Acute blood loss anemia EGD/capsule endoscopy nondiagnostic Hb stable Colonoscopy/small bowel enteroscopy planned for today.  C  Vitamin B12 deficiency Continue supplementation  AKI on CKD stage IIIb Hemodynamically mediated Resolved-creatinine back to baseline  PAF Sinus rhythm Coreg Eliquis on hold due to GI bleeding  Recurrent CVA Plan is to resume Eliquis when GI/endoscopic workup has been completed.  HLD Statin  HTN BP stable on Coreg Amlodipine/losartan on hold-resume when able  GERD PPI  Mood disorder Relatively stable BuSpar/Wellbutrin  BMI: Estimated body mass index is 25.82 kg/m as calculated from the following:   Height as of this encounter: 5\' 6"  (1.676 m).   Weight as of this encounter: 72.6 kg.   Code status:   Code Status: Full Code   DVT Prophylaxis: SCDs Start: 07/19/22 1721   Family Communication: Niece at bedside on 5/22  Disposition Plan: Status is: Inpatient Remains inpatient appropriate because: Severity of illness   Planned Discharge Destination:Home likely on 5/24.   Diet: Diet Order             Diet regular Room service appropriate? Yes; Fluid consistency: Thin  Diet effective now                     Antimicrobial agents: Anti-infectives (From admission, onward)    Start     Dose/Rate Route Frequency  Ordered Stop   07/20/22 1500  cefTRIAXone (ROCEPHIN) 2 g in sodium chloride 0.9 % 100 mL IVPB        2 g 200 mL/hr over 30 Minutes Intravenous Every 24 hours 07/19/22 1720 07/24/22 1543   07/19/22 1630  cefTRIAXone (ROCEPHIN) 1 g in sodium chloride 0.9 % 100 mL IVPB        1 g 200 mL/hr over 30 Minutes Intravenous  Once 07/19/22 1616 07/19/22 1827   07/19/22 1630  azithromycin (ZITHROMAX) tablet 500 mg  Status:  Discontinued        500 mg Oral Daily 07/19/22 1616 07/25/22 1455        MEDICATIONS: Scheduled Meds:  atorvastatin  80 mg Oral QPM   buPROPion  300 mg  Oral Daily   carvedilol  3.125 mg Oral BID   chlorpheniramine-HYDROcodone  5 mL Oral Q12H   cyanocobalamin  500 mcg Oral Daily   gabapentin  100 mg Oral BID   pantoprazole  40 mg Oral BID AC   Continuous Infusions:  lactated ringers 10 mL/hr at 07/27/22 1001   PRN Meds:.acetaminophen **OR** acetaminophen, albuterol, benzonatate, busPIRone, hydrALAZINE, HYDROcodone-acetaminophen   I have personally reviewed following labs and imaging studies  LABORATORY DATA: CBC: Recent Labs  Lab 07/21/22 0556 07/22/22 0335 07/23/22 0321 07/24/22 0439 07/25/22 0523 07/26/22 0820 07/27/22 0407  WBC 3.3*   < > 3.5* 3.7* 4.3 6.0 4.9  NEUTROABS 1.9  --   --   --   --   --   --   HGB 7.5*   < > 7.4* 7.6* 8.0* 9.9* 11.4*  HCT 24.4*   < > 24.8* 24.9* 26.4* 32.4* 37.7  MCV 78.2*   < > 80.0 77.1* 79.0* 78.6* 79.4*  PLT 127*   < > 151 165 170 271 202   < > = values in this interval not displayed.     Basic Metabolic Panel: Recent Labs  Lab 07/21/22 0556 07/27/22 0407  NA 136 139  K 3.6 3.7  CL 107 102  CO2 20* 25  GLUCOSE 105* 107*  BUN 13 7*  CREATININE 1.09* 0.91  CALCIUM 7.5* 8.8*  MG 1.9  --   PHOS 2.7  --      GFR: Estimated Creatinine Clearance: 54.5 mL/min (by C-G formula based on SCr of 0.91 mg/dL).  Liver Function Tests: No results for input(s): "AST", "ALT", "ALKPHOS", "BILITOT", "PROT", "ALBUMIN" in the last 168 hours.  No results for input(s): "LIPASE", "AMYLASE" in the last 168 hours.  No results for input(s): "AMMONIA" in the last 168 hours.  Coagulation Profile: No results for input(s): "INR", "PROTIME" in the last 168 hours.   Cardiac Enzymes: No results for input(s): "CKTOTAL", "CKMB", "CKMBINDEX", "TROPONINI" in the last 168 hours.  BNP (last 3 results) No results for input(s): "PROBNP" in the last 8760 hours.  Lipid Profile: No results for input(s): "CHOL", "HDL", "LDLCALC", "TRIG", "CHOLHDL", "LDLDIRECT" in the last 72 hours.  Thyroid Function  Tests: No results for input(s): "TSH", "T4TOTAL", "FREET4", "T3FREE", "THYROIDAB" in the last 72 hours.  Anemia Panel: No results for input(s): "VITAMINB12", "FOLATE", "FERRITIN", "TIBC", "IRON", "RETICCTPCT" in the last 72 hours.  Urine analysis:    Component Value Date/Time   COLORURINE YELLOW 07/20/2022 0548   APPEARANCEUR CLEAR 07/20/2022 0548   LABSPEC 1.014 07/20/2022 0548   PHURINE 5.0 07/20/2022 0548   GLUCOSEU NEGATIVE 07/20/2022 0548   HGBUR MODERATE (A) 07/20/2022 0548   BILIRUBINUR NEGATIVE 07/20/2022 0548   KETONESUR 5 (  A) 07/20/2022 0548   PROTEINUR NEGATIVE 07/20/2022 0548   NITRITE NEGATIVE 07/20/2022 0548   LEUKOCYTESUR NEGATIVE 07/20/2022 0548    Sepsis Labs: Lactic Acid, Venous    Component Value Date/Time   LATICACIDVEN 1.5 07/19/2022 1600    MICROBIOLOGY: Recent Results (from the past 240 hour(s))  Culture, blood (routine x 2)     Status: None   Collection Time: 07/19/22  2:21 PM   Specimen: BLOOD  Result Value Ref Range Status   Specimen Description BLOOD RIGHT ANTECUBITAL  Final   Special Requests   Final    BOTTLES DRAWN AEROBIC AND ANAEROBIC Blood Culture adequate volume   Culture   Final    NO GROWTH 5 DAYS Performed at [facility], [address], [place], [place] [address]    Report Status 07/24/2022 FINAL  Final  Urine Culture     Status: None   Collection Time: 07/19/22  2:21 PM   Specimen: Urine, Clean Catch  Result Value Ref Range Status   Specimen Description URINE, CLEAN CATCH  Final   Special Requests NONE  Final   Culture   Final    NO GROWTH Performed at [facility], [address], [place], [place] [address]    Report Status 07/21/2022 FINAL  Final  SARS Coronavirus 2 by RT PCR (hospital order, performed in [facility] hospital lab) *cepheid single result test*     Status: None   Collection Time: 07/19/22  2:21 PM   Specimen: Nasal Swab  Result Value Ref Range Status   SARS Coronavirus 2 by RT PCR NEGATIVE  NEGATIVE Final    Comment: Performed at [facility], [address] [place], [place] [address]  Culture, blood (routine x 2)     Status: None   Collection Time: 07/19/22  2:26 PM   Specimen: BLOOD LEFT FOREARM  Result Value Ref Range Status   Specimen Description BLOOD LEFT FOREARM  Final   Special Requests   Final    BOTTLES DRAWN AEROBIC ONLY Blood Culture results may not be optimal due to an inadequate volume of blood received in culture bottles   Culture   Final    NO GROWTH 5 DAYS Performed at [facility], [address], [place], [place] [address]    Report Status 07/24/2022 FINAL  Final  Respiratory (~20 pathogens) panel by PCR     Status: Abnormal   Collection Time: 07/19/22  4:34 PM   Specimen: Nasopharyngeal Swab; Respiratory  Result Value Ref Range Status   Adenovirus NOT DETECTED NOT DETECTED Final   Coronavirus 229E NOT DETECTED NOT DETECTED Final    Comment: (NOTE) The Coronavirus on the Respiratory Panel, DOES NOT test for the novel  Coronavirus (2019 nCoV)    Coronavirus HKU1 NOT DETECTED NOT DETECTED Final   Coronavirus NL63 NOT DETECTED NOT DETECTED Final   Coronavirus OC43 NOT DETECTED NOT DETECTED Final   Metapneumovirus DETECTED (A) NOT DETECTED Final   Rhinovirus / Enterovirus NOT DETECTED NOT DETECTED Final   Influenza A NOT DETECTED NOT DETECTED Final   Influenza B NOT DETECTED NOT DETECTED Final   Parainfluenza Virus 1 NOT DETECTED NOT DETECTED Final   Parainfluenza Virus 2 NOT DETECTED NOT DETECTED Final   Parainfluenza Virus 3 NOT DETECTED NOT DETECTED Final   Parainfluenza Virus 4 NOT DETECTED NOT DETECTED Final   Respiratory Syncytial Virus NOT DETECTED NOT DETECTED Final   Bordetella pertussis NOT DETECTED NOT DETECTED Final  Bordetella Parapertussis NOT DETECTED NOT DETECTED Final   Chlamydophila pneumoniae NOT DETECTED NOT DETECTED Final   Mycoplasma pneumoniae NOT DETECTED NOT DETECTED Final    Comment: Performed at [facility], [address] [place], [place] [address]  C Difficile Quick Screen w PCR reflex     Status: None   Collection Time: 07/22/22  4:00 AM   Specimen: STOOL  Result Value Ref Range Status   C Diff antigen NEGATIVE NEGATIVE Final   C Diff toxin NEGATIVE NEGATIVE Final   C Diff interpretation No C. difficile detected.  Final    Comment: Performed at [facility] Lab, [address] [place], [place] [address]    RADIOLOGY STUDIES/RESULTS: No results found.   LOS: 8 days   Jeoffrey Massed, MD  [facility]    To contact the attending provider between 7A-7P or the covering provider during after hours 7P-7A, please log into the web site [email or web address] and access using universal [place] password for that web site. If you do not have the password, please call the hospital operator.  07/27/2022, 12:21 PM

## 2022-07-27 NOTE — Transfer of Care (Signed)
Immediate Anesthesia Transfer of Care Note  Patient: Ariel [name]  Procedure(s) Performed: COLONOSCOPY WITH PROPOFOL ENTEROSCOPY POLYPECTOMY  Patient Location: PACU  Anesthesia Type:MAC  Level of Consciousness: sedated  Airway & Oxygen Therapy: Patient connected to nasal cannula oxygen  Post-op Assessment: Post -op Vital signs reviewed and stable  Post vital signs: stable  Last Vitals:  Vitals Value Taken Time  BP    Temp    Pulse 70 07/27/22 1127  Resp 24 07/27/22 1127  SpO2 98 % 07/27/22 1127  Vitals shown include unvalidated device data.  Last Pain:  Vitals:   07/27/22 0950  TempSrc: Temporal  PainSc: 0-No pain         Complications: No notable events documented.

## 2022-07-27 NOTE — Anesthesia Preprocedure Evaluation (Addendum)
Anesthesia Evaluation  Patient identified by MRN, date of birth, ID band Patient awake    Reviewed: Allergy & Precautions, NPO status , Patient's Chart, lab work & pertinent test results  History of Anesthesia Complications Negative for: history of anesthetic complications  Airway Mallampati: II  TM Distance: >3 FB Neck ROM: Full    Dental   Pulmonary neg pulmonary ROS   Pulmonary exam normal        Cardiovascular hypertension, Pt. on home beta blockers and Pt. on medications Normal cardiovascular exam+ dysrhythmias Atrial Fibrillation    '22 TTE - EF 60 to 65%. There is mild left ventricular hypertrophy. Grade I diastolic dysfunction (impaired relaxation). Mild mitral valve regurgitation.     Neuro/Psych  PSYCHIATRIC DISORDERS Anxiety Depression    CVA    GI/Hepatic Neg liver ROS,GERD  Medicated,,  Endo/Other  negative endocrine ROS    Renal/GU CRFRenal disease     Musculoskeletal negative musculoskeletal ROS (+)    Abdominal   Peds  Hematology  (+) Blood dyscrasia, anemia  On eliquis    Anesthesia Other Findings   Reproductive/Obstetrics                              Anesthesia Physical Anesthesia Plan  ASA: 3  Anesthesia Plan: MAC   Post-op Pain Management: Minimal or no pain anticipated   Induction: Intravenous  PONV Risk Score and Plan: 2 and Propofol infusion and Treatment may vary due to age or medical condition  Airway Management Planned: Nasal Cannula and Natural Airway  Additional Equipment: None  Intra-op Plan:   Post-operative Plan:   Informed Consent: I have reviewed the patients History and Physical, chart, labs and discussed the procedure including the risks, benefits and alternatives for the proposed anesthesia with the patient or authorized representative who has indicated his/her understanding and acceptance.     Dental advisory given  Plan Discussed  with: CRNA and Anesthesiologist  Anesthesia Plan Comments:          Anesthesia Quick Evaluation

## 2022-07-27 NOTE — Care Management Important Message (Signed)
Important Message  Patient Details  Name: Ariel [name] MRN: [id number] Date of Birth: October 09, 1945   Medicare Important Message Given:  Yes     Dorena Bodo 07/27/2022, 4:14 PM

## 2022-07-27 NOTE — Op Note (Signed)
[facility] Patient Name: Ariel [name] Procedure Date : 07/27/2022 MRN: [id number] Attending MD: Ariel [name] , MD, [id number] Date of Birth: 1945/08/30 CSN: [id number] Age: 77 Admit Type: Inpatient Procedure:                Colonoscopy Indications:              Gastrointestinal bleeding - elevated BUN, dark                            stools - EGD / enteroscopy / capsule without clear                            source. Colonoscopy to further evaluate Providers:                Ariel Spare P. Adela Lank, MD, Adolph Pollack, RN,                            Salley Scarlet, Technician, Ginette Otto, CRNA Referring MD:              Medicines:                Monitored Anesthesia Care Complications:            No immediate complications. Estimated blood loss:                            Minimal. Estimated Blood Loss:     Estimated blood loss was minimal. Procedure:                Pre-Anesthesia Assessment:                           - Prior to the procedure, a History and Physical                            was performed, and patient medications and                            allergies were reviewed. The patient's tolerance of                            previous anesthesia was also reviewed. The risks                            and benefits of the procedure and the sedation                            options and risks were discussed with the patient.                            All questions were answered, and informed consent                            was obtained. Prior Anticoagulants: The patient has  taken Eliquis (apixaban), last dose was 8 days                            prior to procedure. ASA Grade Assessment: III - A                            patient with severe systemic disease. After                            reviewing the risks and benefits, the patient was                            deemed in satisfactory condition to undergo the                             procedure.                           After obtaining informed consent, the colonoscope                            was passed under direct vision. Throughout the                            procedure, the patient's blood pressure, pulse, and                            oxygen saturations were monitored continuously. The                            PCF-HQ190L ([id number]) Olympus peds colonscope was                            introduced through the anus and advanced to the the                            terminal ileum, with identification of the                            appendiceal orifice and IC valve. The colonoscopy                            was performed without difficulty. The patient                            tolerated the procedure well. The quality of the                            bowel preparation was adequate. The terminal ileum,                            ileocecal valve, appendiceal orifice, and rectum  were photographed. Scope In: 11:01:41 AM Scope Out: 11:19:02 AM Scope Withdrawal Time: 0 hours 13 minutes 55 seconds  Total Procedure Duration: 0 hours 17 minutes 21 seconds  Findings:      The perianal and digital rectal examinations were normal.      The terminal ileum appeared normal.      A large amount of brown liquid stool was found in the entire colon,       making visualization difficult. Lavage of the colon was performed using       copious amounts, resulting in clearance with mostly adequate       visualization. A short segment of sigmoid colon and rectum could not be       completely cleared of residual stool but lavaged and no obvious       pathology noted in these areas. The previously placed capsule was noted       in the distal sigmoid colon.      A 4 mm polyp was found in the transverse colon. The polyp was sessile.       The polyp was removed with a cold snare. Resection and retrieval were       complete.       Internal hemorrhoids were found during retroflexion.      The exam was otherwise without abnormality. Impression:               - The examined portion of the ileum was normal.                           - Stool in the entire examined colon leading to                            extensive lavage as outlined. Mostly adequate views                            obtained.                           - One 4 mm polyp in the transverse colon, removed                            with a cold snare. Resected and retrieved.                           - Internal hemorrhoids.                           - The examination was otherwise normal.                           NO cause for bleeding in the colon based on results                            of this exam. Suspect upper tract source - small                            bowel - however not clearly seen on capsule study  or enteroscopy. Recommendation:           - Return patient to hospital ward for ongoing care.                           - Advance diet as tolerated.                           - Continue present medications.                           - Await pathology results.                           - Consider resuming Eliquis tomorrow (would not do                            so today given polypectomy) and see how she                            tolerates it, monitor for recurrent bleeding.                           - We will reassess her tomorrow Procedure Code(s):        --- Professional ---                           [phone], Colonoscopy, flexible; with removal of                            tumor(s), polyp(s), or other lesion(s) by snare                            technique Diagnosis Code(s):        --- Professional ---                           K64.8, Other hemorrhoids                           D12.3, Benign neoplasm of transverse colon (hepatic                            flexure or splenic flexure)                           K92.2,  Gastrointestinal hemorrhage, unspecified CPT copyright 2022 American Medical Association. All rights reserved. The codes documented in this report are preliminary and upon coder review may  be revised to meet current compliance requirements. Ariel Spare P. Ariel Mcanelly, MD 07/27/2022 11:35:20 AM This report has been signed electronically. Number of Addenda: 0

## 2022-07-27 NOTE — Interval H&P Note (Signed)
History and Physical Interval Note: Patient here for enteroscopy and colonoscopy to evaluate dark heme positive stools / IDA. EGD negative. Capsule study showed a lesion that may be the ampulla vs. small ulcer elsewhere. Enteroscopy to further evaluate and colonoscopy to clear her colon prior to resuming Eliquis. I have discussed risks /  benefits of the exams and anesthesia with her and she wishes to proceed.  07/27/2022 10:32 AM  Ariel [name]  has presented today for surgery, with the diagnosis of iron deficiency, abnormal capsule study.  The various methods of treatment have been discussed with the patient and family. After consideration of risks, benefits and other options for treatment, the patient has consented to  Procedure(s): COLONOSCOPY WITH PROPOFOL (N/A) ENTEROSCOPY (N/A) as a surgical intervention.  The patient's history has been reviewed, patient examined, no change in status, stable for surgery.  I have reviewed the patient's chart and labs.  Questions were answered to the patient's satisfaction.     Viviann Spare P Jorgia Manthei

## 2022-07-27 NOTE — Op Note (Signed)
[facility] Patient Name: Ariel [name] Procedure Date : 07/27/2022 MRN: [id number] Attending MD: Willaim Rayas. Adela Lank , MD, [id number] Date of Birth: 1946-01-21 CSN: [id number] Age: 77 Admit Type: Inpatient Procedure:                Small bowel enteroscopy Indications:              Obscure gastrointestinal bleeding - dark stools,                            elevated BUN previously on Eliquis. EGD negative.                            Capsule showed either ampulla vs. ulceration in the                            bowel. Unclear source of her symptoms. Enteroscopy                            to clarify capsule findings and colonoscopy to                            clear lower tract prior to resuming anticoagulation Providers:                Willaim Rayas. Adela Lank, MD, Adolph Pollack, RN,                            Salley Scarlet, Technician, Ginette Otto, CRNA Referring MD:              Medicines:                Monitored Anesthesia Care Complications:            No immediate complications. Estimated blood loss:                            Minimal. Estimated Blood Loss:     Estimated blood loss was minimal. Procedure:                Pre-Anesthesia Assessment:                           - Prior to the procedure, a History and Physical                            was performed, and patient medications and                            allergies were reviewed. The patient's tolerance of                            previous anesthesia was also reviewed. The risks                            and benefits of the procedure and the sedation  options and risks were discussed with the patient.                            All questions were answered, and informed consent                            was obtained. Prior Anticoagulants: The patient has                            taken Eliquis (apixaban), last dose was 8 days                            prior to procedure. ASA  Grade Assessment: III - A                            patient with severe systemic disease. After                            reviewing the risks and benefits, the patient was                            deemed in satisfactory condition to undergo the                            procedure.                           After obtaining informed consent, the endoscope was                            passed under direct vision. Throughout the                            procedure, the patient's blood pressure, pulse, and                            oxygen saturations were monitored continuously. The                            PCF-HQ190L ([id number]) Olympus peds colonscope was                            introduced through the mouth and advanced to the                            proximal jejunum. The small bowel enteroscopy was                            accomplished without difficulty. The patient                            tolerated the procedure well. Scope In: Scope Out: Findings:      A 3 cm hiatal hernia was present.      The exam  of the esophagus was otherwise normal.      Multiple benign small sessile polyps were found in the gastric body,       grossly consistent with fundic gland polyps      The exam of the stomach was otherwise normal.      There was no evidence of significant pathology in the entire examined       duodenum. Duodenal sweep was rather angulated. Did not get good views of       the ampulla.      There was no evidence of significant pathology in the proximal jejunum. Impression:               - 3 cm hiatal hernia.                           - Normal esophagus                           - Multiple gastric polyps.                           - Normal stomach otherwise                           - Normal examined duodenum.                           - The examined portion of the jejunum was normal.                           Overall, no cause for bleeding on this exam.                             Ampulla not well seen but suspect that was the                            lesion noted on the capsule study. No ulceration or                            concerning pathology to cause symptoms on this exam. Recommendation:           - Return patient to hospital ward for ongoing care.                           - Advance diet as tolerated.                           - Continue present medications.                           - Consider resumption of Eliquis in next day - see                            colonoscopy note for details Procedure Code(s):        --- Professional ---  [phone], Small intestinal endoscopy, enteroscopy                            beyond second portion of duodenum, not including                            ileum; diagnostic, including collection of                            specimen(s) by brushing or washing, when performed                            (separate procedure) Diagnosis Code(s):        --- Professional ---                           K44.9, Diaphragmatic hernia without obstruction or                            gangrene                           K31.7, Polyp of stomach and duodenum                           K92.2, Gastrointestinal hemorrhage, unspecified CPT copyright 2022 American Medical Association. All rights reserved. The codes documented in this report are preliminary and upon coder review may  be revised to meet current compliance requirements. Viviann Spare P. Edvin Albus, MD 07/27/2022 11:29:11 AM This report has been signed electronically. Number of Addenda: 0

## 2022-07-27 NOTE — Anesthesia Postprocedure Evaluation (Signed)
Anesthesia Post Note  Patient: Ariel [name]  Procedure(s) Performed: COLONOSCOPY WITH PROPOFOL ENTEROSCOPY POLYPECTOMY     Patient location during evaluation: PACU Anesthesia Type: MAC Level of consciousness: awake and alert Pain management: pain level controlled Vital Signs Assessment: post-procedure vital signs reviewed and stable Respiratory status: spontaneous breathing, nonlabored ventilation and respiratory function stable Cardiovascular status: stable and blood pressure returned to baseline Anesthetic complications: no   No notable events documented.  Last Vitals:  Vitals:   07/27/22 1145 07/27/22 1209  BP: (!) 145/59 (!) 168/65  Pulse: 73   Resp: 20 16  Temp:  36.9 C  SpO2: 96%     Last Pain:  Vitals:   07/27/22 1209  TempSrc: Oral  PainSc:                  [name]

## 2022-07-28 ENCOUNTER — Encounter (HOSPITAL_COMMUNITY): Payer: Self-pay | Admitting: Gastroenterology

## 2022-07-28 DIAGNOSIS — Z7901 Long term (current) use of anticoagulants: Secondary | ICD-10-CM | POA: Diagnosis not present

## 2022-07-28 DIAGNOSIS — D509 Iron deficiency anemia, unspecified: Secondary | ICD-10-CM | POA: Diagnosis not present

## 2022-07-28 DIAGNOSIS — K317 Polyp of stomach and duodenum: Secondary | ICD-10-CM | POA: Diagnosis not present

## 2022-07-28 DIAGNOSIS — K449 Diaphragmatic hernia without obstruction or gangrene: Secondary | ICD-10-CM | POA: Diagnosis not present

## 2022-07-28 DIAGNOSIS — D62 Acute posthemorrhagic anemia: Secondary | ICD-10-CM | POA: Diagnosis not present

## 2022-07-28 DIAGNOSIS — K921 Melena: Secondary | ICD-10-CM | POA: Diagnosis not present

## 2022-07-28 LAB — CBC
HCT: 27.6 % — ABNORMAL LOW (ref 36.0–46.0)
Hemoglobin: 8.5 g/dL — ABNORMAL LOW (ref 12.0–15.0)
MCH: 24.9 pg — ABNORMAL LOW (ref 26.0–34.0)
MCHC: 30.8 g/dL (ref 30.0–36.0)
MCV: 80.9 fL (ref 80.0–100.0)
Platelets: 206 10*3/uL (ref 150–400)
RBC: 3.41 MIL/uL — ABNORMAL LOW (ref 3.87–5.11)
RDW: 19.2 % — ABNORMAL HIGH (ref 11.5–15.5)
WBC: 6.1 10*3/uL (ref 4.0–10.5)
nRBC: 0 % (ref 0.0–0.2)

## 2022-07-28 LAB — HEPARIN LEVEL (UNFRACTIONATED): Heparin Unfractionated: 0.54 IU/mL (ref 0.30–0.70)

## 2022-07-28 LAB — SURGICAL PATHOLOGY

## 2022-07-28 MED ORDER — HEPARIN (PORCINE) 25000 UT/250ML-% IV SOLN
1100.0000 [IU]/h | INTRAVENOUS | Status: DC
  Administered 2022-07-28 – 2022-07-29 (×2): 1100 [IU]/h via INTRAVENOUS
  Filled 2022-07-28 (×2): qty 250

## 2022-07-28 MED ORDER — AMLODIPINE BESYLATE 5 MG PO TABS
5.0000 mg | ORAL_TABLET | Freq: Every day | ORAL | Status: DC
  Administered 2022-07-28 – 2022-07-29 (×2): 5 mg via ORAL
  Filled 2022-07-28 (×2): qty 1

## 2022-07-28 NOTE — Progress Notes (Signed)
ANTICOAGULATION CONSULT NOTE - Initial Consult  Pharmacy Consult for heparin Indication: atrial fibrillation  Allergies  Allergen Reactions   Lisinopril     Other reaction(s): Cough   Perflutren Lipid Microspheres     Other reaction(s): Abdominal Pain, Muscle Pain    Patient Measurements: Height: 5\' 6"  (167.6 cm) Weight: 72.6 kg (160 lb) IBW/kg (Calculated) : 59.3 Heparin Dosing Weight: 73kg  Vital Signs: Temp: 98 F (36.7 C) (05/24 0800) Temp Source: Oral (05/24 0800) BP: 164/66 (05/24 0400) Pulse Rate: 63 (05/24 0400)  Labs: Recent Labs    07/26/22 0820 07/27/22 0407 07/28/22 0407  HGB 9.9* 11.4* 8.5*  HCT 32.4* 37.7 27.6*  PLT 271 202 206  CREATININE  --  0.91  --     Estimated Creatinine Clearance: 54.5 mL/min (by C-G formula based on SCr of 0.91 mg/dL).   Medical History: Past Medical History:  Diagnosis Date   Hypertension    Stroke [facility])     Medications:  Medications Prior to Admission  Medication Sig Dispense Refill Last Dose   alendronate (FOSAMAX) 70 MG tablet Take 70 mg by mouth once a week.   07/19/2022   amLODipine (NORVASC) 10 MG tablet Take 1 tablet by mouth at bedtime.   07/18/2022   apixaban (ELIQUIS) 5 MG TABS tablet TAKE 1 TABLET TWICE DAILY. APPOINTMENT REQUIRED FOR REFILLS [phone] (Patient taking differently: Take 5 mg by mouth 2 (two) times daily.) 60 tablet 0 07/19/2022 at 09:00   atorvastatin (LIPITOR) 80 MG tablet Take 80 mg by mouth every evening.   07/18/2022   buPROPion (WELLBUTRIN XL) 300 MG 24 hr tablet Take 300 mg by mouth daily.   07/19/2022   busPIRone (BUSPAR) 5 MG tablet Take 5 mg by mouth 2 (two) times daily as needed.   07/18/2022   carvedilol (COREG) 3.125 MG tablet Take 1 tablet (3.125 mg total) by mouth 2 (two) times daily.   07/19/2022 at 09:00   cyanocobalamin (VITAMIN B12) 1000 MCG tablet Take 1,000 mcg by mouth daily.   Past Week   famotidine (PEPCID) 20 MG tablet Take 20 mg by mouth daily.   07/18/2022   fenofibrate  micronized (LOFIBRA) 200 MG capsule Take 200 mg by mouth at bedtime.   07/18/2022   fexofenadine (ALLEGRA) 180 MG tablet Take 180 mg by mouth daily as needed for allergies.   07/19/2022   fluticasone (FLONASE) 50 MCG/ACT nasal spray Place 1 spray into both nostrils as needed.   Past Week   hydrochlorothiazide (HYDRODIURIL) 12.5 MG tablet Take 12.5 mg by mouth daily.   07/19/2022   losartan (COZAAR) 50 MG tablet TAKE 1 TABLET(50 MG) BY MOUTH DAILY   07/18/2022 at 20:00   pantoprazole (PROTONIX) 40 MG tablet TAKE 1 TABLET EVERY DAY 90 tablet 1 07/18/2022   gabapentin (NEURONTIN) 100 MG capsule Take 100 mg by mouth as needed.   unknown   Scheduled:   amLODipine  5 mg Oral Daily   atorvastatin  80 mg Oral QPM   buPROPion  300 mg Oral Daily   carvedilol  3.125 mg Oral BID   chlorpheniramine-HYDROcodone  5 mL Oral Q12H   cyanocobalamin  500 mcg Oral Daily   gabapentin  100 mg Oral BID   pantoprazole  40 mg Oral BID AC   Infusions:   heparin     lactated ringers 10 mL/hr at 07/27/22 2039    Assessment: Pt admitted for possible GIB. S/p EGD and colonoscopy. Plan to bridge with heparin today and possibly resume  apixaban in AM.  Hgb 8.5, plt wnl Scr 0.91 Goal of Therapy:  Heparin level 0.3-0.7 units/ml Monitor platelets by anticoagulation protocol: Yes   Plan:   Heparin 1100 units/hr Check 8 hr HL F/u resume apixaban in AM  Ulyses Southward, PharmD, BCIDP, AAHIVP, CPP Infectious Disease Pharmacist 07/28/2022 9:14 AM

## 2022-07-28 NOTE — Progress Notes (Signed)
      Progress Note   Subjective  Patient feels well without complaints. No bleeding symptoms, tolerating a diet.   Objective   Vital signs in last 24 hours: Temp:  [97.1 F (36.2 C)-98.5 F (36.9 C)] 98 F (36.7 C) (05/24 0800) Pulse Rate:  [63-91] 63 (05/24 0400) Resp:  [16-29] 20 (05/24 0400) BP: (126-183)/(52-66) 164/66 (05/24 0400) SpO2:  [91 %-98 %] 95 % (05/24 0800) Weight:  [72.6 kg] 72.6 kg (05/23 0950) Last BM Date : 07/27/22 (per pt report) General:    white female in NAD Neurologic:  Alert and oriented,  grossly normal neurologically. Psych:  Cooperative. Normal mood and affect.  Intake/Output from previous day: 05/23 0701 - 05/24 0700 In: 586.3 [P.O.:480; I.V.:106.3] Out: -  Intake/Output this shift: No intake/output data recorded.  Lab Results: Recent Labs    07/26/22 0820 07/27/22 0407 07/28/22 0407  WBC 6.0 4.9 6.1  HGB 9.9* 11.4* 8.5*  HCT 32.4* 37.7 27.6*  PLT 271 202 206   BMET Recent Labs    07/27/22 0407  NA 139  K 3.7  CL 102  CO2 25  GLUCOSE 107*  BUN 7*  CREATININE 0.91  CALCIUM 8.8*   LFT No results for input(s): "PROT", "ALBUMIN", "AST", "ALT", "ALKPHOS", "BILITOT", "BILIDIR", "IBILI" in the last 72 hours. PT/INR No results for input(s): "LABPROT", "INR" in the last 72 hours.  Studies/Results: No results found.     Assessment / Plan:    77 y/o female here with the following:  Dark heme positive stools Acute on chronic anemia Iron deficiency Anticoagulated (Eliquis)   She has had an extensive workup to date during this admission - EGD (normal), followed by capsule (suspected ampulla noted but could not rule out more distal ulceration), enteroscopy (no ulcer or pathology noted), and colonoscopy (negative for source, 1 polyp removed).   She has had obscure GI bleeding, given BUN elevation suspect she had an upper tract source, likely small bowel bleeding perhaps from a small vascular lesion not seen on her capsule /  enteroscopy. She has not bled any further off Eliquis.  Discussed options with the patient. She has indicates for anticoagulation and hopefully she can tolerate this moving forward. Recommend heparin drip today to see if she tolerates it and develops any bleeding symptoms. If not, then can resume Eliquis tomorrow and perhaps discharge tomorrow. If she has any bleeding symptoms on heparin please contact us and will discuss how we will reassess her (consider CTA if acute bleeding)  PLAN: - regular diet - trial of heparin drip for 24 hours - if she tolerates heparin drip may discharge on Eliquis tomorrow - monitor for rebleeding, trend Hgb  Patient agrees with plan, call with questions.  Harlin Rain, MD [facility]

## 2022-07-28 NOTE — Progress Notes (Signed)
PROGRESS NOTE        PATIENT DETAILS Name: Ariel [name] Age: 77 y.o. Sex: female Date of Birth: July 01, 1945 Admit Date: 07/19/2022 Admitting Physician Starleen Arms, MD UJW:[id number], [name], MD  Brief Summary: Patient is a 77 y.o.  female with prior history of CVA with residual left-sided weakness, HTN, HLD, CKD stage IIIb, PAF on Eliquis-who presented with shortness of breath/melanotic stools-patient was found to have Meta pneumonia virus related PNA and possible upper GI bleeding with acute blood loss anemia.  Significant events: 5/15>> admit to [facility]  Significant studies: 5/15>> CXR: Patchy LLL infiltrate.  Significant microbiology data: 5/15>> blood culture: No growth 5/15>> urine culture: No growth 5/15>> respiratory virus panel: Meta pneumonia virus 5/15>> COVID PCR: Negative 5/18>> stool C. difficile PCR: Negative  Procedures: 5/20>> EGD: No cause of bleeding identified 5/23>> colonoscopy: 4 mm polyp transverse colon-no cause of bleeding evident. 5/23>> small bowel enteroscopy: No cause of bleeding identified.  Consults: GI  Subjective: Lying comfort in bed-no major issues overnight.  Objective: Vitals: Blood pressure (!) 151/58, pulse 67, temperature 98 F (36.7 C), temperature source Oral, resp. rate (!) 23, height 5\' 6"  (1.676 m), weight 72.6 kg, SpO2 95 %.   Exam: Gen Exam:Alert awake-not in any distress HEENT:atraumatic, normocephalic Chest: B/L clear to auscultation anteriorly CVS:S1S2 regular Abdomen:soft non tender, non distended Extremities:no edema Neurology: Non focal Skin: no rash  Pertinent Labs/Radiology:    Latest Ref Rng & Units 07/28/2022    4:07 AM 07/27/2022    4:07 AM 07/26/2022    8:20 AM  CBC  WBC 4.0 - 10.5 K/uL 6.1  4.9  6.0   Hemoglobin 12.0 - 15.0 g/dL 8.5  29.5  9.9   Hematocrit 36.0 - 46.0 % 27.6  37.7  32.4   Platelets 150 - 400 K/uL 206  202  271     Lab Results  Component Value Date   NA  139 07/27/2022   K 3.7 07/27/2022   CL 102 07/27/2022   CO2 25 07/27/2022      Assessment/Plan: Left lobar PNA Clinically improved Afebrile Leukopenia has resolved Completed a course of antibiotics  Upper GI bleeding Acute blood loss anemia EGD/capsule endoscopy nondiagnostic-subsequently underwent small bowel enteroscopy/colonoscopy on 5/23 that did not reveal any source of bleeding GI recommending inpatient challenge of IV heparin before starting Eliquis Continue to monitor closely.  Vitamin B12 deficiency Continue supplementation  AKI on CKD stage IIIb Hemodynamically mediated Resolved-creatinine back to baseline  PAF Sinus rhythm Coreg IV heparin being started today-see above-if no bleeding-Eliquis to be resumed tomorrow.  Recurrent CVA See above regarding plans to resume anticoagulation  HLD Statin  HTN BP creeping up-continue Coreg-restart amlodipine Resume losartan when able.    GERD PPI  Mood disorder Relatively stable BuSpar/Wellbutrin  BMI: Estimated body mass index is 25.82 kg/m as calculated from the following:   Height as of this encounter: 5\' 6"  (1.676 m).   Weight as of this encounter: 72.6 kg.   Code status:   Code Status: Full Code   DVT Prophylaxis: SCDs Start: 07/19/22 1721   Family Communication: Niece at bedside on 5/22  Disposition Plan: Status is: Inpatient Remains inpatient appropriate because: Severity of illness   Planned Discharge Destination:Home likely on 5/24.   Diet: Diet Order  Diet regular Room service appropriate? Yes; Fluid consistency: Thin  Diet effective now                     Antimicrobial agents: Anti-infectives (From admission, onward)    Start     Dose/Rate Route Frequency Ordered Stop   07/20/22 1500  cefTRIAXone (ROCEPHIN) 2 g in sodium chloride 0.9 % 100 mL IVPB        2 g 200 mL/hr over 30 Minutes Intravenous Every 24 hours 07/19/22 1720 07/24/22 1543   07/19/22 1630   cefTRIAXone (ROCEPHIN) 1 g in sodium chloride 0.9 % 100 mL IVPB        1 g 200 mL/hr over 30 Minutes Intravenous  Once 07/19/22 1616 07/19/22 1827   07/19/22 1630  azithromycin (ZITHROMAX) tablet 500 mg  Status:  Discontinued        500 mg Oral Daily 07/19/22 1616 07/25/22 1455        MEDICATIONS: Scheduled Meds:  amLODipine  5 mg Oral Daily   atorvastatin  80 mg Oral QPM   buPROPion  300 mg Oral Daily   carvedilol  3.125 mg Oral BID   chlorpheniramine-HYDROcodone  5 mL Oral Q12H   cyanocobalamin  500 mcg Oral Daily   gabapentin  100 mg Oral BID   pantoprazole  40 mg Oral BID AC   Continuous Infusions:  heparin 1,100 Units/hr (07/28/22 0934)   lactated ringers 10 mL/hr at 07/27/22 2039   PRN Meds:.acetaminophen **OR** acetaminophen, albuterol, benzonatate, busPIRone, hydrALAZINE, HYDROcodone-acetaminophen   I have personally reviewed following labs and imaging studies  LABORATORY DATA: CBC: Recent Labs  Lab 07/24/22 0439 07/25/22 0523 07/26/22 0820 07/27/22 0407 07/28/22 0407  WBC 3.7* 4.3 6.0 4.9 6.1  HGB 7.6* 8.0* 9.9* 11.4* 8.5*  HCT 24.9* 26.4* 32.4* 37.7 27.6*  MCV 77.1* 79.0* 78.6* 79.4* 80.9  PLT 165 170 271 202 206     Basic Metabolic Panel: Recent Labs  Lab 07/27/22 0407  NA 139  K 3.7  CL 102  CO2 25  GLUCOSE 107*  BUN 7*  CREATININE 0.91  CALCIUM 8.8*     GFR: Estimated Creatinine Clearance: 54.5 mL/min (by C-G formula based on SCr of 0.91 mg/dL).  Liver Function Tests: No results for input(s): "AST", "ALT", "ALKPHOS", "BILITOT", "PROT", "ALBUMIN" in the last 168 hours.  No results for input(s): "LIPASE", "AMYLASE" in the last 168 hours.  No results for input(s): "AMMONIA" in the last 168 hours.  Coagulation Profile: No results for input(s): "INR", "PROTIME" in the last 168 hours.   Cardiac Enzymes: No results for input(s): "CKTOTAL", "CKMB", "CKMBINDEX", "TROPONINI" in the last 168 hours.  BNP (last 3 results) No results for  input(s): "PROBNP" in the last 8760 hours.  Lipid Profile: No results for input(s): "CHOL", "HDL", "LDLCALC", "TRIG", "CHOLHDL", "LDLDIRECT" in the last 72 hours.  Thyroid Function Tests: No results for input(s): "TSH", "T4TOTAL", "FREET4", "T3FREE", "THYROIDAB" in the last 72 hours.  Anemia Panel: No results for input(s): "VITAMINB12", "FOLATE", "FERRITIN", "TIBC", "IRON", "RETICCTPCT" in the last 72 hours.  Urine analysis:    Component Value Date/Time   COLORURINE YELLOW 07/20/2022 0548   APPEARANCEUR CLEAR 07/20/2022 0548   LABSPEC 1.014 07/20/2022 0548   PHURINE 5.0 07/20/2022 0548   GLUCOSEU NEGATIVE 07/20/2022 0548   HGBUR MODERATE (A) 07/20/2022 0548   BILIRUBINUR NEGATIVE 07/20/2022 0548   KETONESUR 5 (A) 07/20/2022 0548   PROTEINUR NEGATIVE 07/20/2022 0548   NITRITE NEGATIVE 07/20/2022 0548   LEUKOCYTESUR  NEGATIVE 07/20/2022 0548    Sepsis Labs: Lactic Acid, Venous    Component Value Date/Time   LATICACIDVEN 1.5 07/19/2022 1600    MICROBIOLOGY: Recent Results (from the past 240 hour(s))  Culture, blood (routine x 2)     Status: None   Collection Time: 07/19/22  2:21 PM   Specimen: BLOOD  Result Value Ref Range Status   Specimen Description BLOOD RIGHT ANTECUBITAL  Final   Special Requests   Final    BOTTLES DRAWN AEROBIC AND ANAEROBIC Blood Culture adequate volume   Culture   Final    NO GROWTH 5 DAYS Performed at [facility], [address] [place], [place] [address]    Report Status 07/24/2022 FINAL  Final  Urine Culture     Status: None   Collection Time: 07/19/22  2:21 PM   Specimen: Urine, Clean Catch  Result Value Ref Range Status   Specimen Description URINE, CLEAN CATCH  Final   Special Requests NONE  Final   Culture   Final    NO GROWTH Performed at [facility], [address], [place], [place] [address]    Report Status 07/21/2022 FINAL  Final  SARS Coronavirus 2 by RT PCR (hospital order, performed in [facility] hospital  lab) *cepheid single result test*     Status: None   Collection Time: 07/19/22  2:21 PM   Specimen: Nasal Swab  Result Value Ref Range Status   SARS Coronavirus 2 by RT PCR NEGATIVE NEGATIVE Final    Comment: Performed at [facility], [address] [place], [place] [address]  Culture, blood (routine x 2)     Status: None   Collection Time: 07/19/22  2:26 PM   Specimen: BLOOD LEFT FOREARM  Result Value Ref Range Status   Specimen Description BLOOD LEFT FOREARM  Final   Special Requests   Final    BOTTLES DRAWN AEROBIC ONLY Blood Culture results may not be optimal due to an inadequate volume of blood received in culture bottles   Culture   Final    NO GROWTH 5 DAYS Performed at [facility], [address] [place], [place] [address]    Report Status 07/24/2022 FINAL  Final  Respiratory (~20 pathogens) panel by PCR     Status: Abnormal   Collection Time: 07/19/22  4:34 PM   Specimen: Nasopharyngeal Swab; Respiratory  Result Value Ref Range Status   Adenovirus NOT DETECTED NOT DETECTED Final   Coronavirus 229E NOT DETECTED NOT DETECTED Final    Comment: (NOTE) The Coronavirus on the Respiratory Panel, DOES NOT test for the novel  Coronavirus (2019 nCoV)    Coronavirus HKU1 NOT DETECTED NOT DETECTED Final   Coronavirus NL63 NOT DETECTED NOT DETECTED Final   Coronavirus OC43 NOT DETECTED NOT DETECTED Final   Metapneumovirus DETECTED (A) NOT DETECTED Final   Rhinovirus / Enterovirus NOT DETECTED NOT DETECTED Final   Influenza A NOT DETECTED NOT DETECTED Final   Influenza B NOT DETECTED NOT DETECTED Final   Parainfluenza Virus 1 NOT DETECTED NOT DETECTED Final   Parainfluenza Virus 2 NOT DETECTED NOT DETECTED Final   Parainfluenza Virus 3 NOT DETECTED NOT DETECTED Final   Parainfluenza Virus 4 NOT DETECTED NOT DETECTED Final   Respiratory Syncytial Virus NOT DETECTED NOT DETECTED Final   Bordetella pertussis NOT DETECTED NOT DETECTED Final   Bordetella Parapertussis  NOT DETECTED NOT DETECTED Final   Chlamydophila pneumoniae NOT DETECTED NOT DETECTED Final  Mycoplasma pneumoniae NOT DETECTED NOT DETECTED Final    Comment: Performed at [facility] Lab, [address] [place], [place] [address]  C Difficile Quick Screen w PCR reflex     Status: None   Collection Time: 07/22/22  4:00 AM   Specimen: STOOL  Result Value Ref Range Status   C Diff antigen NEGATIVE NEGATIVE Final   C Diff toxin NEGATIVE NEGATIVE Final   C Diff interpretation No C. difficile detected.  Final    Comment: Performed at [facility], [address], [place], [place] [address]    RADIOLOGY STUDIES/RESULTS: No results found.   LOS: 9 days   Jeoffrey Massed, MD  [facility]    To contact the attending provider between 7A-7P or the covering provider during after hours 7P-7A, please log into the web site [email or web address] and access using universal [place] password for that web site. If you do not have the password, please call the hospital operator.  07/28/2022, 12:12 PM

## 2022-07-28 NOTE — Progress Notes (Signed)
ANTICOAGULATION CONSULT NOTE - follow-up  Pharmacy Consult for heparin Indication: atrial fibrillation  Allergies  Allergen Reactions   Lisinopril     Other reaction(s): Cough   Perflutren Lipid Microspheres     Other reaction(s): Abdominal Pain, Muscle Pain    Patient Measurements: Height: 5\' 6"  (167.6 cm) Weight: 72.6 kg (160 lb) IBW/kg (Calculated) : 59.3 Heparin Dosing Weight: 73kg  Vital Signs: Temp: 98.7 F (37.1 C) (05/24 1600) Temp Source: Oral (05/24 1600) BP: 151/58 (05/24 0800) Pulse Rate: 67 (05/24 0800)  Labs: Recent Labs    07/26/22 0820 07/27/22 0407 07/28/22 0407 07/28/22 1756  HGB 9.9* 11.4* 8.5*  --   HCT 32.4* 37.7 27.6*  --   PLT 271 202 206  --   HEPARINUNFRC  --   --   --  0.54  CREATININE  --  0.91  --   --      Estimated Creatinine Clearance: 54.5 mL/min (by C-G formula based on SCr of 0.91 mg/dL).   Medical History: Past Medical History:  Diagnosis Date   Hypertension    Stroke [facility])     Medications:  Medications Prior to Admission  Medication Sig Dispense Refill Last Dose   alendronate (FOSAMAX) 70 MG tablet Take 70 mg by mouth once a week.   07/19/2022   amLODipine (NORVASC) 10 MG tablet Take 1 tablet by mouth at bedtime.   07/18/2022   apixaban (ELIQUIS) 5 MG TABS tablet TAKE 1 TABLET TWICE DAILY. APPOINTMENT REQUIRED FOR REFILLS [phone] (Patient taking differently: Take 5 mg by mouth 2 (two) times daily.) 60 tablet 0 07/19/2022 at 09:00   atorvastatin (LIPITOR) 80 MG tablet Take 80 mg by mouth every evening.   07/18/2022   buPROPion (WELLBUTRIN XL) 300 MG 24 hr tablet Take 300 mg by mouth daily.   07/19/2022   busPIRone (BUSPAR) 5 MG tablet Take 5 mg by mouth 2 (two) times daily as needed.   07/18/2022   carvedilol (COREG) 3.125 MG tablet Take 1 tablet (3.125 mg total) by mouth 2 (two) times daily.   07/19/2022 at 09:00   cyanocobalamin (VITAMIN B12) 1000 MCG tablet Take 1,000 mcg by mouth daily.   Past Week   famotidine  (PEPCID) 20 MG tablet Take 20 mg by mouth daily.   07/18/2022   fenofibrate micronized (LOFIBRA) 200 MG capsule Take 200 mg by mouth at bedtime.   07/18/2022   fexofenadine (ALLEGRA) 180 MG tablet Take 180 mg by mouth daily as needed for allergies.   07/19/2022   fluticasone (FLONASE) 50 MCG/ACT nasal spray Place 1 spray into both nostrils as needed.   Past Week   hydrochlorothiazide (HYDRODIURIL) 12.5 MG tablet Take 12.5 mg by mouth daily.   07/19/2022   losartan (COZAAR) 50 MG tablet TAKE 1 TABLET(50 MG) BY MOUTH DAILY   07/18/2022 at 20:00   pantoprazole (PROTONIX) 40 MG tablet TAKE 1 TABLET EVERY DAY 90 tablet 1 07/18/2022   gabapentin (NEURONTIN) 100 MG capsule Take 100 mg by mouth as needed.   unknown   Scheduled:   amLODipine  5 mg Oral Daily   atorvastatin  80 mg Oral QPM   buPROPion  300 mg Oral Daily   carvedilol  3.125 mg Oral BID   chlorpheniramine-HYDROcodone  5 mL Oral Q12H   cyanocobalamin  500 mcg Oral Daily   gabapentin  100 mg Oral BID   pantoprazole  40 mg Oral BID AC   Infusions:   heparin 1,100 Units/hr (07/28/22 0934)   lactated  ringers 10 mL/hr at 07/27/22 2039    Assessment: Pt admitted for possible GIB. S/p EGD and colonoscopy. Plan to bridge with heparin today and possibly resume apixaban in AM.  5/24 PM update: HL 0.54  Goal of Therapy:  Heparin level 0.3-0.7 units/ml Monitor platelets by anticoagulation protocol: Yes   Plan:   Continue Heparin 1100 units/hr Check HL in AM F/u resume apixaban in AM  Tennile Styles BS, PharmD, BCPS Clinical Pharmacist 07/28/2022 6:48 PM  Contact: [phone] after 3 PM  "Be curious, not judgmental..." -Debbora Dus

## 2022-07-28 NOTE — Progress Notes (Signed)
Physical Therapy Treatment Patient Details Name: Ariel [name] MRN: [id number] DOB: 1945/09/10 Today's Date: 07/28/2022   History of Present Illness Patient is 77 y.o. female who presented 07/19/22 to ED with cough, shortness of breath, and dark stools. ED workup significant for dehydration, sodium of 130, chest x-ray significant for LLL opacity, hemoglobin low at 7.9, down from 10.1 this March, colonoscopy last October at [facility] significant for polyps, patient reports she never had endoscopy before, she denies any NSAIDs use, she was Hemoccult positive. Admitted for PNA and GIB. PMH significant for HTN, Stroke.    PT Comments    Pt greeted resting in bed and agreeable to session with continued progress towards acute goals. Pt able to come to sitting EOB with min guard assist for safety and step pivot EOB>BSC with min assist to steady during transfer. Pt able to progress gait distance this session x3 bouts with improved cadence and min guard for safety with RW for support. Pt with some L drift during gait, with increased fatigue, needing min A to correct. Pt was educated on continued walker use to maximize functional independence, safety, and decrease risk for falls as well as appropriate activity progression and importance of time OOB with pt verbalizing understanding. Pt continues to benefit from skilled PT services to progress toward functional mobility goals.    Recommendations for follow up therapy are one component of a multi-disciplinary discharge planning process, led by the attending physician.  Recommendations may be updated based on patient status, additional functional criteria and insurance authorization.  Follow Up Recommendations       Assistance Recommended at Discharge Frequent or constant Supervision/Assistance  Patient can return home with the following A lot of help with walking and/or transfers;A little help with bathing/dressing/bathroom;Assistance with  cooking/housework;Direct supervision/assist for medications management;Assist for transportation;Help with stairs or ramp for entrance   Equipment Recommendations  None recommended by PT    Recommendations for Other Services       Precautions / Restrictions Precautions Precautions: Fall Precaution Comments: watch O2 Restrictions Weight Bearing Restrictions: No     Mobility  Bed Mobility Overal bed mobility: Needs Assistance Bed Mobility: Sit to Supine, Supine to Sit     Supine to sit: HOB elevated, Min guard Sit to supine: Min assist   General bed mobility comments: min A to return LE to bed at end of session    Transfers Overall transfer level: Needs assistance Equipment used: Rolling walker (2 wheels), None Transfers: Sit to/from Stand, Bed to chair/wheelchair/BSC Sit to Stand: Min guard, Min assist   Step pivot transfers: Min assist       General transfer comment: min a to rise and step pivot to [facility], Min assist to power up to from [facility] down to min guard from EOB and chair to RW, cues for hand placement to initiate rise    Ambulation/Gait Ambulation/Gait assistance: Min assist, Min guard Gait Distance (Feet): 30 Feet (+ 45' x2) Assistive device: Rolling walker (2 wheels) Gait Pattern/deviations: Step-through pattern, Decreased stride length, Decreased dorsiflexion - right, Decreased dorsiflexion - left, Shuffle Gait velocity: reduced     General Gait Details: min guard for safety, no LOB   Stairs             Wheelchair Mobility    Modified Rankin (Stroke Patients Only)       Balance Overall balance assessment: Needs assistance Sitting-balance support: Feet supported Sitting balance-Leahy Scale: Fair Sitting balance - Comments: up in chair and very controlled with  posture   Standing balance support: During functional activity, Reliant on assistive device for balance, Bilateral upper extremity supported Standing balance-Leahy Scale:  Poor Standing balance comment: requires assist and UE supprot                            Cognition Arousal/Alertness: Awake/alert Behavior During Therapy: WFL for tasks assessed/performed Overall Cognitive Status: Within Functional Limits for tasks assessed                                          Exercises      General Comments        Pertinent Vitals/Pain Pain Assessment Pain Assessment: Faces Faces Pain Scale: No hurt Pain Intervention(s): Monitored during session    Home Living                          Prior Function            PT Goals (current goals can now be found in the care plan section) Acute Rehab PT Goals PT Goal Formulation: With patient Time For Goal Achievement: 08/03/22 Progress towards PT goals: Progressing toward goals    Frequency    Min 3X/week      PT Plan Current plan remains appropriate    Co-evaluation              AM-PAC PT "6 Clicks" Mobility   Outcome Measure  Help needed turning from your back to your side while in a flat bed without using bedrails?: A Little Help needed moving from lying on your back to sitting on the side of a flat bed without using bedrails?: A Little Help needed moving to and from a bed to a chair (including a wheelchair)?: A Little Help needed standing up from a chair using your arms (e.g., wheelchair or bedside chair)?: A Little Help needed to walk in hospital room?: A Little Help needed climbing 3-5 steps with a railing? : A Lot 6 Click Score: 17    End of Session Equipment Utilized During Treatment: Gait belt Activity Tolerance: Patient tolerated treatment well Patient left: in bed;with call bell/phone within reach;with bed alarm set Nurse Communication: Mobility status PT Visit Diagnosis: Unsteadiness on feet (R26.81);Muscle weakness (generalized) (M62.81);Other abnormalities of gait and mobility (R26.89);Difficulty in walking, not elsewhere classified  (R26.2)     Time: 1610-9604 PT Time Calculation (min) (ACUTE ONLY): 23 min  Charges:  $Gait Training: 8-22 mins $Therapeutic Activity: 8-22 mins                     Payne Garske R. PTA Acute Rehabilitation Services Office: [phone]   Catalina Antigua 07/28/2022, 4:33 PM

## 2022-07-28 NOTE — Progress Notes (Signed)
Occupational Therapy Treatment Patient Details Name: Ariel [name] MRN: [id number] DOB: 1945-07-24 Today's Date: 07/28/2022   History of present illness Patient is 77 y.o. female who presented 07/19/22 to ED with cough, shortness of breath, and dark stools. ED workup significant for dehydration, sodium of 130, chest x-ray significant for LLL opacity, hemoglobin low at 7.9, down from 10.1 this March, colonoscopy last October at [facility] significant for polyps, patient reports she never had endoscopy before, she denies any NSAIDs use, she was Hemoccult positive. Admitted for PNA and GIB. PMH significant for HTN, Stroke.   OT comments  Pt supine in bed with HOB elevated upon OT arrival. Pt agreeable to participation in skilled OT session. OT instructed pt in B UE exercises following HEP with orange theraband and yellow theraputty and in techniques for increased safety and independence with ADLs, bed mobility, and functional transfers. Pt is making good progress toward OT goals. Pt currently demonstrates ability to complete UB ADLs with Mod I to Min guard assist, LB ADLs with Min to Min-Mod assist, and functional transfers/mobility with Min guard to Min assist. Pt will benefit from continued acute skilled OT services to address deficits and increase safety and independence with ADLs. Discharge plan remains appropriate.    Recommendations for follow up therapy are one component of a multi-disciplinary discharge planning process, led by the attending physician.  Recommendations may be updated based on patient status, additional functional criteria and insurance authorization.    Assistance Recommended at Discharge Frequent or constant Supervision/Assistance  Patient can return home with the following  A little help with walking and/or transfers;A little help with bathing/dressing/bathroom;Assistance with cooking/housework;Assist for transportation;Help with stairs or ramp for entrance   Equipment  Recommendations  None recommended by OT    Recommendations for Other Services      Precautions / Restrictions Precautions Precautions: Fall Precaution Comments: watch O2 Restrictions Weight Bearing Restrictions: No       Mobility Bed Mobility Overal bed mobility: Needs Assistance Bed Mobility: Sit to Supine, Supine to Sit     Supine to sit: HOB elevated, Min guard Sit to supine: Min assist        Transfers Overall transfer level: Needs assistance Equipment used: Rolling walker (2 wheels), None Transfers: Sit to/from Stand Sit to Stand: Min guard, Min assist                 Balance Overall balance assessment: Needs assistance Sitting-balance support: Feet supported Sitting balance-Leahy Scale: Fair     Standing balance support: During functional activity, Reliant on assistive device for balance, Bilateral upper extremity supported Standing balance-Leahy Scale: Poor Standing balance comment: requires assist and UE supprot                           ADL either performed or assessed with clinical judgement   ADL Overall ADL's : Needs assistance/impaired Eating/Feeding: Modified independent   Grooming: Modified independent;Sitting   Upper Body Bathing: Min guard;Sitting   Lower Body Bathing: Minimal assistance;Sit to/from stand   Upper Body Dressing : Set up;Sitting   Lower Body Dressing: Minimal assistance;Moderate assistance;Sit to/from stand   Toilet Transfer: Min guard;Minimal assistance;Ambulation;BSC/3in1   Toileting- Clothing Manipulation and Hygiene: Minimal assistance;Sit to/from stand       Functional mobility during ADLs: Min guard;Minimal assistance;Rolling walker (2 wheels);Cueing for safety      Extremity/Trunk Assessment Upper Extremity Assessment Upper Extremity Assessment: Generalized weakness  Vision       Perception     Praxis      Cognition Arousal/Alertness: Awake/alert Behavior During  Therapy: WFL for tasks assessed/performed Overall Cognitive Status: Within Functional Limits for tasks assessed                                          Exercises Exercises: General Upper Extremity, Hand exercises (HEP provided; Medbridge Access Code: [email or web address]) General Exercises - Upper Extremity Shoulder Flexion: Strengthening, Both, 10 reps, Seated, Theraband Shoulder Extension: Strengthening, Both, 10 reps, Seated, Theraband Shoulder Horizontal ABduction: Strengthening, Both, 10 reps, Seated, Theraband Shoulder Horizontal ADduction: Strengthening, Both, 10 reps, Seated, Theraband Elbow Flexion: Strengthening, Both, 10 reps, Seated, Theraband Wrist Flexion: Strengthening, Both, 10 reps, Seated, Other (comment) (holding 8 oz. soda can) Wrist Extension: Strengthening, Both, 10 reps, Seated (holding 8 oz. soda can) Other Exercises Other Exercises: pinch, roll, and squeeze exercises, 10 reps both hands; yellow theraputty    Shoulder Instructions       General Comments VSS on 2L continuous O2 through nasal canula throughout session.    Pertinent Vitals/ Pain       Pain Assessment Pain Assessment: No/denies pain  Home Living                                          Prior Functioning/Environment              Frequency  Min 2X/week        Progress Toward Goals  OT Goals(current goals can now be found in the care plan section)  Progress towards OT goals: Progressing toward goals  Acute Rehab OT Goals Patient Stated Goal: to have more energy  Plan Discharge plan remains appropriate    Co-evaluation                 AM-PAC OT "6 Clicks" Daily Activity     Outcome Measure   Help from another person eating meals?: None Help from another person taking care of personal grooming?: None Help from another person toileting, which includes using toliet, bedpan, or urinal?: A Little Help from another person bathing (including  washing, rinsing, drying)?: A Little Help from another person to put on and taking off regular upper body clothing?: A Little Help from another person to put on and taking off regular lower body clothing?: A Little (Min-Mod assist) 6 Click Score: 20    End of Session Equipment Utilized During Treatment: Oxygen  OT Visit Diagnosis: Unsteadiness on feet (R26.81);Repeated falls (R29.6);Muscle weakness (generalized) (M62.81);History of falling (Z91.81);Other (comment) (decreased activity tolerance)   Activity Tolerance Patient tolerated treatment well   Patient Left in bed;with call bell/phone within reach;with bed alarm set   Nurse Communication Mobility status        Time: 1354-1435 OT Time Calculation (min): 41 min  Charges: OT General Charges $OT Visit: 1 Visit OT Treatments $Self Care/Home Management : 8-22 mins $Therapeutic Exercise: 8-22 mins  Jamie Hafford "Orson Eva., OTR/L, MA Acute Rehab [phone]   Lendon Colonel 07/28/2022, 5:27 PM

## 2022-07-29 DIAGNOSIS — K317 Polyp of stomach and duodenum: Secondary | ICD-10-CM | POA: Diagnosis not present

## 2022-07-29 DIAGNOSIS — I480 Paroxysmal atrial fibrillation: Secondary | ICD-10-CM | POA: Diagnosis not present

## 2022-07-29 DIAGNOSIS — J189 Pneumonia, unspecified organism: Secondary | ICD-10-CM | POA: Diagnosis not present

## 2022-07-29 DIAGNOSIS — K449 Diaphragmatic hernia without obstruction or gangrene: Secondary | ICD-10-CM | POA: Diagnosis not present

## 2022-07-29 DIAGNOSIS — N1832 Chronic kidney disease, stage 3b: Secondary | ICD-10-CM | POA: Diagnosis not present

## 2022-07-29 DIAGNOSIS — D62 Acute posthemorrhagic anemia: Secondary | ICD-10-CM | POA: Diagnosis not present

## 2022-07-29 DIAGNOSIS — K921 Melena: Secondary | ICD-10-CM | POA: Diagnosis not present

## 2022-07-29 LAB — CBC
HCT: 28.1 % — ABNORMAL LOW (ref 36.0–46.0)
Hemoglobin: 8.5 g/dL — ABNORMAL LOW (ref 12.0–15.0)
MCH: 24.9 pg — ABNORMAL LOW (ref 26.0–34.0)
MCHC: 30.2 g/dL (ref 30.0–36.0)
MCV: 82.2 fL (ref 80.0–100.0)
Platelets: 216 10*3/uL (ref 150–400)
RBC: 3.42 MIL/uL — ABNORMAL LOW (ref 3.87–5.11)
RDW: 19.7 % — ABNORMAL HIGH (ref 11.5–15.5)
WBC: 6 10*3/uL (ref 4.0–10.5)
nRBC: 0 % (ref 0.0–0.2)

## 2022-07-29 LAB — HEPARIN LEVEL (UNFRACTIONATED): Heparin Unfractionated: 0.62 IU/mL (ref 0.30–0.70)

## 2022-07-29 MED ORDER — APIXABAN 5 MG PO TABS
5.0000 mg | ORAL_TABLET | Freq: Two times a day (BID) | ORAL | Status: DC
  Administered 2022-07-29: 5 mg via ORAL
  Filled 2022-07-29: qty 1

## 2022-07-29 NOTE — Discharge Summary (Signed)
PATIENT DETAILS Name: Ariel [name] Age: 77 y.o. Sex: female Date of Birth: 09/06/1945 no thank you so much  MRN: [id number]. Admitting Physician: Starleen Arms, MD WUJ:[id number], [name], MD  Admit Date: 07/19/2022 Discharge date: 07/29/2022  Recommendations for Outpatient Follow-up:  Follow up with PCP in 1-2 weeks Please obtain CMP/CBC in one week  Admitted From:  Home  Disposition: Home   Discharge Condition: good  CODE STATUS:   Code Status: Full Code   Diet recommendation:  Diet Order             Diet - low sodium heart healthy           Diet regular Room service appropriate? Yes; Fluid consistency: Thin  Diet effective now                    Brief Summary: Patient is a 77 y.o.  female with prior history of CVA with residual left-sided weakness, HTN, HLD, CKD stage IIIb, PAF on Eliquis-who presented with shortness of breath/melanotic stools-patient was found to have Meta pneumonia virus related PNA and possible upper GI bleeding with acute blood loss anemia.   Significant events: 5/15>> admit to [facility]   Significant studies: 5/15>> CXR: Patchy LLL infiltrate.   Significant microbiology data: 5/15>> blood culture: No growth 5/15>> urine culture: No growth 5/15>> respiratory virus panel: Meta pneumonia virus 5/15>> COVID PCR: Negative 5/18>> stool C. difficile PCR: Negative   Procedures: 5/20>> EGD: No cause of bleeding identified 5/23>> colonoscopy: 4 mm polyp transverse colon-no cause of bleeding evident. 5/23>> small bowel enteroscopy: No cause of bleeding identified.   Consults: GI  Brief Hospital Course: Left lobar PNA Clinically improved Afebrile Leukopenia has resolved Completed a course of antibiotics   Upper GI bleeding Acute blood loss anemia EGD/capsule endoscopy nondiagnostic-subsequently underwent small bowel enteroscopy/colonoscopy on 5/23 that did not reveal any source of bleeding Per GI recommendation-she was challenged  with IV heparin for 24 hours-no further bleeding-will be transition to Eliquis and discharged home. Continue close outpatient monitoring for recurrent GI bleeding.  Difficult situation-extensive GI workup negative-has had recurrent CVA-patient is okay with resuming Eliquis.   Vitamin B12 deficiency Continue supplementation   AKI on CKD stage IIIb Hemodynamically mediated Resolved-creatinine back to baseline   PAF Sinus rhythm Coreg Eliquis being resumed today.   Recurrent CVA See above regarding plans to resume anticoagulation   HLD Statin   HTN BP creeping up-on Coreg/amlodipine-losartan will be resumed on discharge.    GERD PPI   Mood disorder Relatively stable BuSpar/Wellbutrin   BMI: Estimated body mass index is 25.82 kg/m as calculated from the following:   Height as of this encounter: 5\' 6"  (1.676 m).   Weight as of this encounter: 72.6 kg.   Discharge Diagnoses:  Principal Problem:   Pneumonia Active Problems:   CVA (cerebral vascular accident) (HCC)   Benign essential HTN   Anxiety and depression   GERD (gastroesophageal reflux disease)   Stage 3b chronic kidney disease (HCC)   Paroxysmal atrial fibrillation (HCC)   Normocytic anemia   Melena   Acute diarrhea   Acute blood loss anemia   Gastrointestinal hemorrhage   Iron deficiency anemia   Dark stools   Heme positive stool   Anticoagulated   Benign neoplasm of colon   Discharge Instructions:  Activity:  As tolerated with Full fall precautions use walker/cane & assistance as needed   Discharge Instructions     Call MD for:   Complete  by: As directed    Black /bloody stools.   Call MD for:  difficulty breathing, headache or visual disturbances   Complete by: As directed    Call MD for:  extreme fatigue   Complete by: As directed    Call MD for:  persistant dizziness or light-headedness   Complete by: As directed    Diet - low sodium heart healthy   Complete by: As directed     Discharge instructions   Complete by: As directed    Follow with Primary MD  Macy Mis, MD in 1-2 weeks  Please get a complete blood count and chemistry panel checked by your Primary MD at your next visit, and again as instructed by your Primary MD.  Get Medicines reviewed and adjusted: Please take all your medications with you for your next visit with your Primary MD  Laboratory/radiological data: Please request your Primary MD to go over all hospital tests and procedure/radiological results at the follow up, please ask your Primary MD to get all Hospital records sent to his/her office.  In some cases, they will be blood work, cultures and biopsy results pending at the time of your discharge. Please request that your primary care M.D. follows up on these results.  Also Note the following: If you experience worsening of your admission symptoms, develop shortness of breath, life threatening emergency, suicidal or homicidal thoughts you must seek medical attention immediately by calling 911 or calling your MD immediately  if symptoms less severe.  You must read complete instructions/literature along with all the possible adverse reactions/side effects for all the Medicines you take and that have been prescribed to you. Take any new Medicines after you have completely understood and accpet all the possible adverse reactions/side effects.   Do not drive when taking Pain medications or sleeping medications (Benzodaizepines)  Do not take more than prescribed Pain, Sleep and Anxiety Medications. It is not advisable to combine anxiety,sleep and pain medications without talking with your primary care practitioner  Special Instructions: If you have smoked or chewed Tobacco  in the last 2 yrs please stop smoking, stop any regular Alcohol  and or any Recreational drug use.  Wear Seat belts while driving.  Please note: You were cared for by a hospitalist during your hospital stay. Once you are  discharged, your primary care physician will handle any further medical issues. Please note that NO REFILLS for any discharge medications will be authorized once you are discharged, as it is imperative that you return to your primary care physician (or establish a relationship with a primary care physician if you do not have one) for your post hospital discharge needs so that they can reassess your need for medications and monitor your lab values.   Increase activity slowly   Complete by: As directed       Allergies as of 07/29/2022       Reactions   Lisinopril    Other reaction(s): Cough   Perflutren Lipid Microspheres    Other reaction(s): Abdominal Pain, Muscle Pain        Medication List     TAKE these medications    alendronate 70 MG tablet Commonly known as: FOSAMAX Take 70 mg by mouth once a week.   amLODipine 10 MG tablet Commonly known as: NORVASC Take 1 tablet by mouth at bedtime.   atorvastatin 80 MG tablet Commonly known as: LIPITOR Take 80 mg by mouth every evening.   buPROPion 300 MG 24 hr  tablet Commonly known as: WELLBUTRIN XL Take 300 mg by mouth daily.   busPIRone 5 MG tablet Commonly known as: BUSPAR Take 5 mg by mouth 2 (two) times daily as needed.   carvedilol 3.125 MG tablet Commonly known as: COREG Take 1 tablet (3.125 mg total) by mouth 2 (two) times daily.   cyanocobalamin 1000 MCG tablet Commonly known as: VITAMIN B12 Take 1,000 mcg by mouth daily.   Eliquis 5 MG Tabs tablet Generic drug: apixaban TAKE 1 TABLET TWICE DAILY. APPOINTMENT REQUIRED FOR REFILLS [phone] What changed: See the new instructions.   famotidine 20 MG tablet Commonly known as: PEPCID Take 20 mg by mouth daily.   fenofibrate micronized 200 MG capsule Commonly known as: LOFIBRA Take 200 mg by mouth at bedtime.   fexofenadine 180 MG tablet Commonly known as: ALLEGRA Take 180 mg by mouth daily as needed for allergies.   fluticasone 50 MCG/ACT nasal  spray Commonly known as: FLONASE Place 1 spray into both nostrils as needed.   gabapentin 100 MG capsule Commonly known as: NEURONTIN Take 100 mg by mouth as needed.   hydrochlorothiazide 12.5 MG tablet Commonly known as: HYDRODIURIL Take 12.5 mg by mouth daily.   losartan 50 MG tablet Commonly known as: COZAAR TAKE 1 TABLET(50 MG) BY MOUTH DAILY   pantoprazole 40 MG tablet Commonly known as: PROTONIX TAKE 1 TABLET EVERY DAY        Follow-up Information     Macy Mis, MD. Schedule an appointment as soon as possible for a visit in 1 week(s).   Specialty: Family Medicine Contact information: [address] [place] [address] [phone]                Allergies  Allergen Reactions   Lisinopril     Other reaction(s): Cough   Perflutren Lipid Microspheres     Other reaction(s): Abdominal Pain, Muscle Pain     Other Procedures/Studies: DG Chest Portable 1 View  Result Date: 07/19/2022 CLINICAL DATA:  Shortness of breath, cough EXAM: PORTABLE CHEST 1 VIEW COMPARISON:  03/31/2018 FINDINGS: Transverse diameter of heart is slightly increased. There are no signs of pulmonary edema. Patchy infiltrates are seen in the left lower lung field. There is no significant pleural effusion or pneumothorax. There is implantable cardiac monitoring device in left chest wall. IMPRESSION: Patchy infiltrate in left lower lung field suggesting atelectasis/pneumonia. Electronically Signed   By: Ernie Avena M.D.   On: 07/19/2022 15:24   CUP PACEART REMOTE DEVICE CHECK  Result Date: 07/13/2022 ILR summary report received. Battery status OK. Normal device function. No new symptom, tachy, brady, or pause episodes. No new AF episodes. Monthly summary reports and ROV/PRN. MC, CVRS.    TODAY-DAY OF DISCHARGE:  Subjective:   Ariel [name] today has no headache,no chest abdominal pain,no new weakness tingling or numbness, feels much better wants to go home  today.   Objective:   Blood pressure (!) 149/65, pulse (!) 58, temperature 98 F (36.7 C), temperature source Oral, resp. rate 15, height 5\' 6"  (1.676 m), weight 72.6 kg, SpO2 95 %.  Intake/Output Summary (Last 24 hours) at 07/29/2022 0928 Last data filed at 07/28/2022 1900 Gross per 24 hour  Intake 538.67 ml  Output --  Net 538.67 ml   Filed Weights   07/24/22 0846 07/27/22 0950  Weight: 74 kg 72.6 kg    Exam: Awake Alert, Oriented *3, No new F.N deficits, Normal affect [place].AT,PERRAL Supple Neck,No JVD, No cervical lymphadenopathy appriciated.  Symmetrical Chest wall movement, Good air movement bilaterally, CTAB RRR,No Gallops,Rubs or new Murmurs, No Parasternal Heave +ve B.Sounds, Abd Soft, Non tender, No organomegaly appriciated, No rebound -guarding or rigidity. No Cyanosis, Clubbing or edema, No new Rash or bruise   PERTINENT RADIOLOGIC STUDIES: No results found.   PERTINENT LAB RESULTS: CBC: Recent Labs    07/28/22 0407 07/29/22 0241  WBC 6.1 6.0  HGB 8.5* 8.5*  HCT 27.6* 28.1*  PLT 206 216   CMET CMP     Component Value Date/Time   NA 139 07/27/2022 0407   K 3.7 07/27/2022 0407   CL 102 07/27/2022 0407   CO2 25 07/27/2022 0407   GLUCOSE 107 (H) 07/27/2022 0407   BUN 7 (L) 07/27/2022 0407   CREATININE 0.91 07/27/2022 0407   CALCIUM 8.8 (L) 07/27/2022 0407   PROT 6.3 (L) 07/19/2022 1410   ALBUMIN 3.2 (L) 07/19/2022 1410   AST 65 (H) 07/19/2022 1410   ALT 25 07/19/2022 1410   ALKPHOS 33 (L) 07/19/2022 1410   BILITOT 0.6 07/19/2022 1410   GFRNONAA >60 07/27/2022 0407   GFRAA >60 03/31/2018 1831    GFR Estimated Creatinine Clearance: 54.5 mL/min (by C-G formula based on SCr of 0.91 mg/dL). No results for input(s): "LIPASE", "AMYLASE" in the last 72 hours. No results for input(s): "CKTOTAL", "CKMB", "CKMBINDEX", "TROPONINI" in the last 72 hours. Invalid input(s): "POCBNP" No results for input(s): "DDIMER" in the last 72 hours. No results for  input(s): "HGBA1C" in the last 72 hours. No results for input(s): "CHOL", "HDL", "LDLCALC", "TRIG", "CHOLHDL", "LDLDIRECT" in the last 72 hours. No results for input(s): "TSH", "T4TOTAL", "T3FREE", "THYROIDAB" in the last 72 hours.  Invalid input(s): "FREET3" No results for input(s): "VITAMINB12", "FOLATE", "FERRITIN", "TIBC", "IRON", "RETICCTPCT" in the last 72 hours. Coags: No results for input(s): "INR" in the last 72 hours.  Invalid input(s): "PT" Microbiology: Recent Results (from the past 240 hour(s))  Culture, blood (routine x 2)     Status: None   Collection Time: 07/19/22  2:21 PM   Specimen: BLOOD  Result Value Ref Range Status   Specimen Description BLOOD RIGHT ANTECUBITAL  Final   Special Requests   Final    BOTTLES DRAWN AEROBIC AND ANAEROBIC Blood Culture adequate volume   Culture   Final    NO GROWTH 5 DAYS Performed at [facility], [address] [place], [place] [address]    Report Status 07/24/2022 FINAL  Final  Urine Culture     Status: None   Collection Time: 07/19/22  2:21 PM   Specimen: Urine, Clean Catch  Result Value Ref Range Status   Specimen Description URINE, CLEAN CATCH  Final   Special Requests NONE  Final   Culture   Final    NO GROWTH Performed at [facility], [address], [place], [place] [address]    Report Status 07/21/2022 FINAL  Final  SARS Coronavirus 2 by RT PCR (hospital order, performed in [facility] hospital lab) *cepheid single result test*     Status: None   Collection Time: 07/19/22  2:21 PM   Specimen: Nasal Swab  Result Value Ref Range Status   SARS Coronavirus 2 by RT PCR NEGATIVE NEGATIVE Final    Comment: Performed at [facility], [address] [place], [place] [address]  Culture, blood (routine x 2)     Status: None   Collection Time: 07/19/22  2:26 PM   Specimen: BLOOD LEFT FOREARM  Result Value Ref Range Status   Specimen Description BLOOD LEFT FOREARM  Final   Special Requests   Final     BOTTLES DRAWN AEROBIC ONLY Blood Culture results may not be optimal due to an inadequate volume of blood received in culture bottles   Culture   Final    NO GROWTH 5 DAYS Performed at [facility], [address], [place], [place] [address]    Report Status 07/24/2022 FINAL  Final  Respiratory (~20 pathogens) panel by PCR     Status: Abnormal   Collection Time: 07/19/22  4:34 PM   Specimen: Nasopharyngeal Swab; Respiratory  Result Value Ref Range Status   Adenovirus NOT DETECTED NOT DETECTED Final   Coronavirus 229E NOT DETECTED NOT DETECTED Final    Comment: (NOTE) The Coronavirus on the Respiratory Panel, DOES NOT test for the novel  Coronavirus (2019 nCoV)    Coronavirus HKU1 NOT DETECTED NOT DETECTED Final   Coronavirus NL63 NOT DETECTED NOT DETECTED Final   Coronavirus OC43 NOT DETECTED NOT DETECTED Final   Metapneumovirus DETECTED (A) NOT DETECTED Final   Rhinovirus / Enterovirus NOT DETECTED NOT DETECTED Final   Influenza A NOT DETECTED NOT DETECTED Final   Influenza B NOT DETECTED NOT DETECTED Final   Parainfluenza Virus 1 NOT DETECTED NOT DETECTED Final   Parainfluenza Virus 2 NOT DETECTED NOT DETECTED Final   Parainfluenza Virus 3 NOT DETECTED NOT DETECTED Final   Parainfluenza Virus 4 NOT DETECTED NOT DETECTED Final   Respiratory Syncytial Virus NOT DETECTED NOT DETECTED Final   Bordetella pertussis NOT DETECTED NOT DETECTED Final   Bordetella Parapertussis NOT DETECTED NOT DETECTED Final   Chlamydophila pneumoniae NOT DETECTED NOT DETECTED Final   Mycoplasma pneumoniae NOT DETECTED NOT DETECTED Final    Comment: Performed at [facility], [address], [place], [place] [address]  C Difficile Quick Screen w PCR reflex     Status: None   Collection Time: 07/22/22  4:00 AM   Specimen: STOOL  Result Value Ref Range Status   C Diff antigen NEGATIVE NEGATIVE Final   C Diff toxin NEGATIVE NEGATIVE Final   C Diff interpretation No C. difficile detected.  Final     Comment: Performed at [facility], [address] [place], [place] [address]    FURTHER DISCHARGE INSTRUCTIONS:  Get Medicines reviewed and adjusted: Please take all your medications with you for your next visit with your Primary MD  Laboratory/radiological data: Please request your Primary MD to go over all hospital tests and procedure/radiological results at the follow up, please ask your Primary MD to get all Hospital records sent to his/her office.  In some cases, they will be blood work, cultures and biopsy results pending at the time of your discharge. Please request that your primary care M.D. goes through all the records of your hospital data and follows up on these results.  Also Note the following: If you experience worsening of your admission symptoms, develop shortness of breath, life threatening emergency, suicidal or homicidal thoughts you must seek medical attention immediately by calling 911 or calling your MD immediately  if symptoms less severe.  You must read complete instructions/literature along with all the possible adverse reactions/side effects for all the Medicines you take and that have been prescribed to you. Take any new Medicines after you have completely understood and accpet all the possible adverse reactions/side effects.   Do not drive when taking Pain medications or sleeping medications (  Benzodaizepines)  Do not take more than prescribed Pain, Sleep and Anxiety Medications. It is not advisable to combine anxiety,sleep and pain medications without talking with your primary care practitioner  Special Instructions: If you have smoked or chewed Tobacco  in the last 2 yrs please stop smoking, stop any regular Alcohol  and or any Recreational drug use.  Wear Seat belts while driving.  Please note: You were cared for by a hospitalist during your hospital stay. Once you are discharged, your primary care physician will handle any further medical issues.  Please note that NO REFILLS for any discharge medications will be authorized once you are discharged, as it is imperative that you return to your primary care physician (or establish a relationship with a primary care physician if you do not have one) for your post hospital discharge needs so that they can reassess your need for medications and monitor your lab values.  Total Time spent coordinating discharge including counseling, education and face to face time equals greater than 30 minutes.  SignedJeoffrey Massed 07/29/2022 9:28 AM

## 2022-07-29 NOTE — Final Progress Note (Signed)
      Progress Note   Subjective  No bleeding, tolerated heparin well. Hgb stable.   Objective   Vital signs in last 24 hours: Temp:  [97.5 F (36.4 C)-98.7 F (37.1 C)] 98 F (36.7 C) (05/25 0912) Pulse Rate:  [58-83] 58 (05/25 0500) Resp:  [15-23] 15 (05/25 0500) BP: (149-169)/(56-65) 149/65 (05/25 0500) SpO2:  [82 %-96 %] 95 % (05/25 0500) Last BM Date : 07/28/22 General:    white female in NAD Neurologic:  Alert and oriented,  grossly normal neurologically. Psych:  Cooperative. Normal mood and affect.  Intake/Output from previous day: 05/24 0701 - 05/25 0700 In: 538.7 [P.O.:490; I.V.:48.7] Out: -  Intake/Output this shift: No intake/output data recorded.  Lab Results: Recent Labs    07/27/22 0407 07/28/22 0407 07/29/22 0241  WBC 4.9 6.1 6.0  HGB 11.4* 8.5* 8.5*  HCT 37.7 27.6* 28.1*  PLT 202 206 216   BMET Recent Labs    07/27/22 0407  NA 139  K 3.7  CL 102  CO2 25  GLUCOSE 107*  BUN 7*  CREATININE 0.91  CALCIUM 8.8*   LFT No results for input(s): "PROT", "ALBUMIN", "AST", "ALT", "ALKPHOS", "BILITOT", "BILIDIR", "IBILI" in the last 72 hours. PT/INR No results for input(s): "LABPROT", "INR" in the last 72 hours.  Studies/Results: No results found.     Assessment / Plan:    77 y/o female here with the following:   Dark heme positive stools Acute on chronic anemia Iron deficiency Anticoagulated (Eliquis)   Extensive workup to date during this admission - EGD (normal), followed by capsule (suspected ampulla noted but could not rule out more distal ulceration), enteroscopy (no ulcer or pathology noted), and colonoscopy (negative for source, 1 polyp removed).    She has had obscure GI bleeding, given BUN elevation suspect she had an upper tract source, likely small bowel bleeding perhaps from a small vascular lesion not seen on her capsule / enteroscopy. She has not bled any further off Eliquis.  She tolerated a heparin drip for the past 24  hours - no rebleeding, Hgb stable. I think we can stop heparin drip and resume her normal Eliquis today. She can go home. Will need follow up CBC next week and a follow up with her primary GI MD at [facility]. Should she have any rebleeding she needs to stop her Eliquis and seek care at the ED again. She agrees, all questions answered.    PLAN: - regular diet - stop heparin drip - resume Eliquis - okay to discharge home - CBC next week with PCP or primary GI MD to make sure stable - outpatient follow up with primary GI MD at [facility].   Patient agrees with plan, call with questions.  Harlin Rain, MD [facility]

## 2022-07-29 NOTE — Plan of Care (Signed)
Discharge paperwork has been reviewed with patient at this time. No further requests have been made. IV has been removed. Patients family member is by the bedside and has arrived to transport patient home.   Problem: Activity: Goal: Ability to tolerate increased activity will improve Outcome: Adequate for Discharge   Problem: Clinical Measurements: Goal: Ability to maintain a body temperature in the normal range will improve Outcome: Adequate for Discharge   Problem: Respiratory: Goal: Ability to maintain adequate ventilation will improve Outcome: Adequate for Discharge Goal: Ability to maintain a clear airway will improve Outcome: Adequate for Discharge   Problem: Education: Goal: Knowledge of General Education information will improve Description: Including pain rating scale, medication(s)/side effects and non-pharmacologic comfort measures Outcome: Adequate for Discharge   Problem: Health Behavior/Discharge Planning: Goal: Ability to manage health-related needs will improve Outcome: Adequate for Discharge   Problem: Clinical Measurements: Goal: Ability to maintain clinical measurements within normal limits will improve Outcome: Adequate for Discharge Goal: Will remain free from infection Outcome: Adequate for Discharge Goal: Diagnostic test results will improve Outcome: Adequate for Discharge Goal: Respiratory complications will improve Outcome: Adequate for Discharge Goal: Cardiovascular complication will be avoided Outcome: Adequate for Discharge   Problem: Activity: Goal: Risk for activity intolerance will decrease Outcome: Adequate for Discharge   Problem: Nutrition: Goal: Adequate nutrition will be maintained Outcome: Adequate for Discharge   Problem: Coping: Goal: Level of anxiety will decrease Outcome: Adequate for Discharge   Problem: Elimination: Goal: Will not experience complications related to bowel motility Outcome: Adequate for Discharge Goal:  Will not experience complications related to urinary retention Outcome: Adequate for Discharge   Problem: Pain Managment: Goal: General experience of comfort will improve Outcome: Adequate for Discharge   Problem: Safety: Goal: Ability to remain free from injury will improve Outcome: Adequate for Discharge   Problem: Skin Integrity: Goal: Risk for impaired skin integrity will decrease Outcome: Adequate for Discharge

## 2022-07-30 ENCOUNTER — Encounter (HOSPITAL_COMMUNITY): Payer: Self-pay | Admitting: Gastroenterology

## 2022-07-31 ENCOUNTER — Encounter: Payer: Self-pay | Admitting: Gastroenterology

## 2022-08-10 NOTE — Progress Notes (Signed)
Carelink Summary Report / Loop Recorder 

## 2022-08-14 ENCOUNTER — Encounter: Payer: Self-pay | Admitting: Psychology

## 2022-08-21 ENCOUNTER — Ambulatory Visit (INDEPENDENT_AMBULATORY_CARE_PROVIDER_SITE_OTHER): Payer: BC Managed Care – PPO

## 2022-08-21 DIAGNOSIS — I639 Cerebral infarction, unspecified: Secondary | ICD-10-CM

## 2022-08-21 LAB — CUP PACEART REMOTE DEVICE CHECK
Date Time Interrogation Session: 20240616230947
Implantable Pulse Generator Implant Date: 20220920

## 2022-09-08 NOTE — Progress Notes (Signed)
Carelink Summary Report / Loop Recorder 

## 2022-09-25 ENCOUNTER — Ambulatory Visit (INDEPENDENT_AMBULATORY_CARE_PROVIDER_SITE_OTHER): Payer: BC Managed Care – PPO

## 2022-09-25 DIAGNOSIS — I639 Cerebral infarction, unspecified: Secondary | ICD-10-CM

## 2022-09-25 LAB — CUP PACEART REMOTE DEVICE CHECK
Date Time Interrogation Session: 20240719230607
Implantable Pulse Generator Implant Date: 20220920

## 2022-10-05 NOTE — Progress Notes (Signed)
Carelink Summary Report / Loop Recorder 

## 2022-10-30 ENCOUNTER — Ambulatory Visit: Payer: Medicare HMO

## 2022-11-08 NOTE — Progress Notes (Signed)
Carelink Summary Report / Loop Recorder 

## 2022-11-22 ENCOUNTER — Other Ambulatory Visit: Payer: Self-pay | Admitting: Adult Health

## 2022-12-04 ENCOUNTER — Ambulatory Visit: Payer: Medicare HMO

## 2023-01-08 ENCOUNTER — Ambulatory Visit: Payer: Medicare HMO

## 2023-02-12 ENCOUNTER — Ambulatory Visit: Payer: Medicare HMO

## 2023-03-19 ENCOUNTER — Ambulatory Visit: Payer: Medicare HMO

## 2023-05-28 ENCOUNTER — Ambulatory Visit

## 2023-05-28 DIAGNOSIS — I639 Cerebral infarction, unspecified: Secondary | ICD-10-CM

## 2023-05-30 LAB — CUP PACEART REMOTE DEVICE CHECK
Date Time Interrogation Session: 20250323230747
Implantable Pulse Generator Implant Date: 20220920

## 2023-06-18 ENCOUNTER — Encounter: Payer: Medicare HMO | Attending: Psychology | Admitting: Psychology

## 2023-06-18 DIAGNOSIS — Z8673 Personal history of transient ischemic attack (TIA), and cerebral infarction without residual deficits: Secondary | ICD-10-CM | POA: Insufficient documentation

## 2023-06-18 DIAGNOSIS — F32A Depression, unspecified: Secondary | ICD-10-CM | POA: Insufficient documentation

## 2023-06-18 DIAGNOSIS — F419 Anxiety disorder, unspecified: Secondary | ICD-10-CM | POA: Diagnosis present

## 2023-06-18 DIAGNOSIS — R269 Unspecified abnormalities of gait and mobility: Secondary | ICD-10-CM | POA: Insufficient documentation

## 2023-06-18 DIAGNOSIS — I639 Cerebral infarction, unspecified: Secondary | ICD-10-CM | POA: Diagnosis present

## 2023-06-18 DIAGNOSIS — R262 Difficulty in walking, not elsewhere classified: Secondary | ICD-10-CM | POA: Insufficient documentation

## 2023-06-18 DIAGNOSIS — Z008 Encounter for other general examination: Secondary | ICD-10-CM | POA: Insufficient documentation

## 2023-06-28 ENCOUNTER — Encounter

## 2023-07-12 NOTE — Addendum Note (Signed)
 Addended by: Edra Govern D on: 07/12/2023 04:09 PM   Modules accepted: Orders

## 2023-07-12 NOTE — Progress Notes (Signed)
 [facility] Summary Report / Loop Recorder

## 2023-08-02 ENCOUNTER — Encounter

## 2023-09-03 ENCOUNTER — Encounter

## 2023-09-06 ENCOUNTER — Encounter

## 2023-09-26 ENCOUNTER — Encounter: Attending: Psychology | Admitting: Psychology

## 2023-10-04 ENCOUNTER — Encounter

## 2023-10-08 ENCOUNTER — Ambulatory Visit: Admitting: Psychology

## 2023-10-11 ENCOUNTER — Encounter

## 2023-11-05 ENCOUNTER — Encounter

## 2023-11-15 ENCOUNTER — Encounter

## 2023-11-29 ENCOUNTER — Encounter: Attending: Psychology | Admitting: Psychology

## 2023-11-29 ENCOUNTER — Encounter: Payer: Self-pay | Admitting: Psychology

## 2023-11-29 DIAGNOSIS — I639 Cerebral infarction, unspecified: Secondary | ICD-10-CM | POA: Insufficient documentation

## 2023-11-29 DIAGNOSIS — I69398 Other sequelae of cerebral infarction: Secondary | ICD-10-CM | POA: Insufficient documentation

## 2023-11-29 DIAGNOSIS — F32A Depression, unspecified: Secondary | ICD-10-CM | POA: Insufficient documentation

## 2023-11-29 DIAGNOSIS — R262 Difficulty in walking, not elsewhere classified: Secondary | ICD-10-CM | POA: Insufficient documentation

## 2023-11-29 DIAGNOSIS — F419 Anxiety disorder, unspecified: Secondary | ICD-10-CM | POA: Diagnosis not present
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
## 2023-12-06 ENCOUNTER — Ambulatory Visit

## 2023-12-06 DIAGNOSIS — I639 Cerebral infarction, unspecified: Secondary | ICD-10-CM | POA: Diagnosis not present

## 2023-12-06 LAB — CUP PACEART REMOTE DEVICE CHECK
Date Time Interrogation Session: 20250930231942
Implantable Pulse Generator Implant Date: 20220920

## 2023-12-10 ENCOUNTER — Ambulatory Visit: Payer: Self-pay | Admitting: Cardiology

## 2023-12-10 NOTE — Progress Notes (Signed)
 Remote Loop Recorder Transmission

## 2023-12-20 ENCOUNTER — Encounter

## 2024-01-07 ENCOUNTER — Ambulatory Visit

## 2024-01-07 ENCOUNTER — Encounter

## 2024-01-07 DIAGNOSIS — I639 Cerebral infarction, unspecified: Secondary | ICD-10-CM | POA: Diagnosis not present

## 2024-01-07 LAB — CUP PACEART REMOTE DEVICE CHECK
Date Time Interrogation Session: 20251102231643
Implantable Pulse Generator Implant Date: 20220920

## 2024-01-08 ENCOUNTER — Ambulatory Visit: Payer: Self-pay | Admitting: Cardiology

## 2024-01-09 NOTE — Progress Notes (Signed)
 Remote Loop Recorder Transmission

## 2024-01-24 ENCOUNTER — Encounter

## 2024-02-07 ENCOUNTER — Ambulatory Visit: Attending: Cardiology

## 2024-02-07 ENCOUNTER — Ambulatory Visit: Payer: Self-pay | Admitting: Cardiology

## 2024-02-07 ENCOUNTER — Encounter

## 2024-02-07 LAB — CUP PACEART REMOTE DEVICE CHECK
Date Time Interrogation Session: 20251203231647
Implantable Pulse Generator Implant Date: 20220920

## 2024-02-13 NOTE — Progress Notes (Signed)
 Remote Loop Recorder Transmission

## 2024-02-28 ENCOUNTER — Encounter

## 2024-03-09 ENCOUNTER — Ambulatory Visit: Attending: Cardiology

## 2024-03-09 DIAGNOSIS — I639 Cerebral infarction, unspecified: Secondary | ICD-10-CM | POA: Diagnosis not present

## 2024-03-10 ENCOUNTER — Encounter

## 2024-03-10 LAB — CUP PACEART REMOTE DEVICE CHECK
Date Time Interrogation Session: 20260103230745
Implantable Pulse Generator Implant Date: 20220920

## 2024-03-11 ENCOUNTER — Ambulatory Visit: Payer: Self-pay | Admitting: Cardiology

## 2024-03-13 NOTE — Progress Notes (Signed)
 Remote Loop Recorder Transmission

## 2024-03-27 ENCOUNTER — Encounter: Attending: Psychology | Admitting: Psychology

## 2024-04-03 ENCOUNTER — Encounter

## 2024-04-09 ENCOUNTER — Ambulatory Visit

## 2024-04-09 LAB — CUP PACEART REMOTE DEVICE CHECK
Date Time Interrogation Session: 20260203231249
Implantable Pulse Generator Implant Date: 20220920

## 2024-04-11 ENCOUNTER — Ambulatory Visit: Payer: Self-pay | Admitting: Cardiology

## 2024-04-16 ENCOUNTER — Ambulatory Visit: Admitting: Psychology

## 2024-05-10 ENCOUNTER — Ambulatory Visit

## 2024-06-10 ENCOUNTER — Ambulatory Visit

## 2024-07-11 ENCOUNTER — Ambulatory Visit
